# Patient Record
Sex: Female | Born: 1935 | Race: White | Hispanic: No | Smoking: Never smoker
Health system: Southern US, Community
[De-identification: ages and names within clinical notes are randomized; demographics above are authoritative.]

## PROBLEM LIST (undated history)

## (undated) DIAGNOSIS — L821 Other seborrheic keratosis: Secondary | ICD-10-CM

## (undated) DIAGNOSIS — I5032 Chronic diastolic (congestive) heart failure: Secondary | ICD-10-CM

## (undated) DIAGNOSIS — N183 Chronic kidney disease, stage 3 unspecified: Secondary | ICD-10-CM

## (undated) DIAGNOSIS — J849 Interstitial pulmonary disease, unspecified: Secondary | ICD-10-CM

## (undated) DIAGNOSIS — I10 Essential (primary) hypertension: Secondary | ICD-10-CM

## (undated) DIAGNOSIS — K5790 Diverticulosis of intestine, part unspecified, without perforation or abscess without bleeding: Secondary | ICD-10-CM

## (undated) DIAGNOSIS — M8580 Other specified disorders of bone density and structure, unspecified site: Secondary | ICD-10-CM

## (undated) DIAGNOSIS — I4819 Other persistent atrial fibrillation: Secondary | ICD-10-CM

## (undated) HISTORY — DX: Chronic kidney disease, stage 3 (moderate): N18.3

## (undated) HISTORY — DX: Other specified disorders of bone density and structure, unspecified site: M85.80

## (undated) HISTORY — DX: Other persistent atrial fibrillation: I48.19

## (undated) HISTORY — DX: Chronic diastolic (congestive) heart failure: I50.32

## (undated) HISTORY — PX: UMBILICAL HERNIA REPAIR: SHX196

## (undated) HISTORY — DX: Essential (primary) hypertension: I10

## (undated) HISTORY — DX: Diverticulosis of intestine, part unspecified, without perforation or abscess without bleeding: K57.90

## (undated) HISTORY — DX: Interstitial pulmonary disease, unspecified: J84.9

## (undated) HISTORY — DX: Chronic kidney disease, stage 3 unspecified: N18.30

---

## 2004-02-18 ENCOUNTER — Other Ambulatory Visit: Admission: RE | Admit: 2004-02-18 | Discharge: 2004-02-18 | Payer: Self-pay | Admitting: Family Medicine

## 2004-04-25 ENCOUNTER — Ambulatory Visit: Payer: Self-pay | Admitting: Family Medicine

## 2004-06-13 ENCOUNTER — Ambulatory Visit: Payer: Self-pay | Admitting: Family Medicine

## 2004-06-16 ENCOUNTER — Encounter: Admission: RE | Admit: 2004-06-16 | Discharge: 2004-06-16 | Payer: Self-pay | Admitting: Family Medicine

## 2005-05-11 ENCOUNTER — Ambulatory Visit: Payer: Self-pay | Admitting: Family Medicine

## 2005-05-25 ENCOUNTER — Ambulatory Visit: Payer: Self-pay | Admitting: Family Medicine

## 2005-06-02 ENCOUNTER — Ambulatory Visit: Payer: Self-pay | Admitting: Internal Medicine

## 2005-06-05 ENCOUNTER — Encounter: Admission: RE | Admit: 2005-06-05 | Discharge: 2005-06-05 | Payer: Self-pay | Admitting: Family Medicine

## 2005-09-16 ENCOUNTER — Emergency Department (HOSPITAL_COMMUNITY): Admission: EM | Admit: 2005-09-16 | Discharge: 2005-09-16 | Payer: Self-pay | Admitting: Emergency Medicine

## 2006-08-14 ENCOUNTER — Ambulatory Visit: Payer: Self-pay | Admitting: Family Medicine

## 2006-08-14 LAB — CONVERTED CEMR LAB
Albumin: 4 g/dL (ref 3.5–5.2)
Basophils Absolute: 0.1 10*3/uL (ref 0.0–0.1)
Cholesterol: 152 mg/dL (ref 0–200)
Eosinophils Absolute: 0.2 10*3/uL (ref 0.0–0.6)
GFR calc Af Amer: 62 mL/min
GFR calc non Af Amer: 51 mL/min
HCT: 43 % (ref 36.0–46.0)
HDL: 46.9 mg/dL (ref >39.0)
Hemoglobin: 15 g/dL (ref 12.0–15.0)
MCHC: 34.8 g/dL (ref 30.0–36.0)
MCV: 95.9 fL (ref 78.0–100.0)
Monocytes Absolute: 0.5 10*3/uL (ref 0.2–0.7)
Neutrophils Relative %: 61.9 % (ref 43.0–77.0)
Potassium: 4.2 meq/L (ref 3.5–5.1)
Sodium: 138 meq/L (ref 135–145)
TSH: 1.49 microintl units/mL (ref 0.35–5.50)
Total Bilirubin: 1.3 mg/dL — ABNORMAL HIGH (ref 0.3–1.2)
Total CHOL/HDL Ratio: 3.2

## 2006-09-20 ENCOUNTER — Ambulatory Visit: Payer: Self-pay | Admitting: Family Medicine

## 2006-09-27 ENCOUNTER — Encounter: Admission: RE | Admit: 2006-09-27 | Discharge: 2006-09-27 | Payer: Self-pay | Admitting: Surgery

## 2006-10-01 ENCOUNTER — Ambulatory Visit (HOSPITAL_BASED_OUTPATIENT_CLINIC_OR_DEPARTMENT_OTHER): Admission: RE | Admit: 2006-10-01 | Discharge: 2006-10-01 | Payer: Self-pay | Admitting: Surgery

## 2007-01-07 IMAGING — CR DG LUMBAR SPINE COMPLETE 4+V
5 series · 5 of 5 positions shown · non-contrast
Comparison: none

CLINICAL DATA: No known injuries.  Left-sided lower back pain.  Spasms to groin.  Constipation.
 DIAGNOSTIC LUMBAR SPINE COMPLETE:
 Five non-rib bearing lumbar vertebrae are seen with slight dextrorotary scoliosis lumbar spine.  Slight degenerative disc space narrowing is seen at L1-2 and L3-4.  Remaining lumbar disc spaces and posterior vertebral alignment are maintained.  Slight bilateral L5-S1 facet degenerative joint disease is noted with primarily right lateral degenerative osteophytes at L4-5.  Slight vascular calcification and calcified midline pelvic uterine fibroid measures 3.5 cm long x 3.2 cm wide.

[view not recorded (1 of 5)]
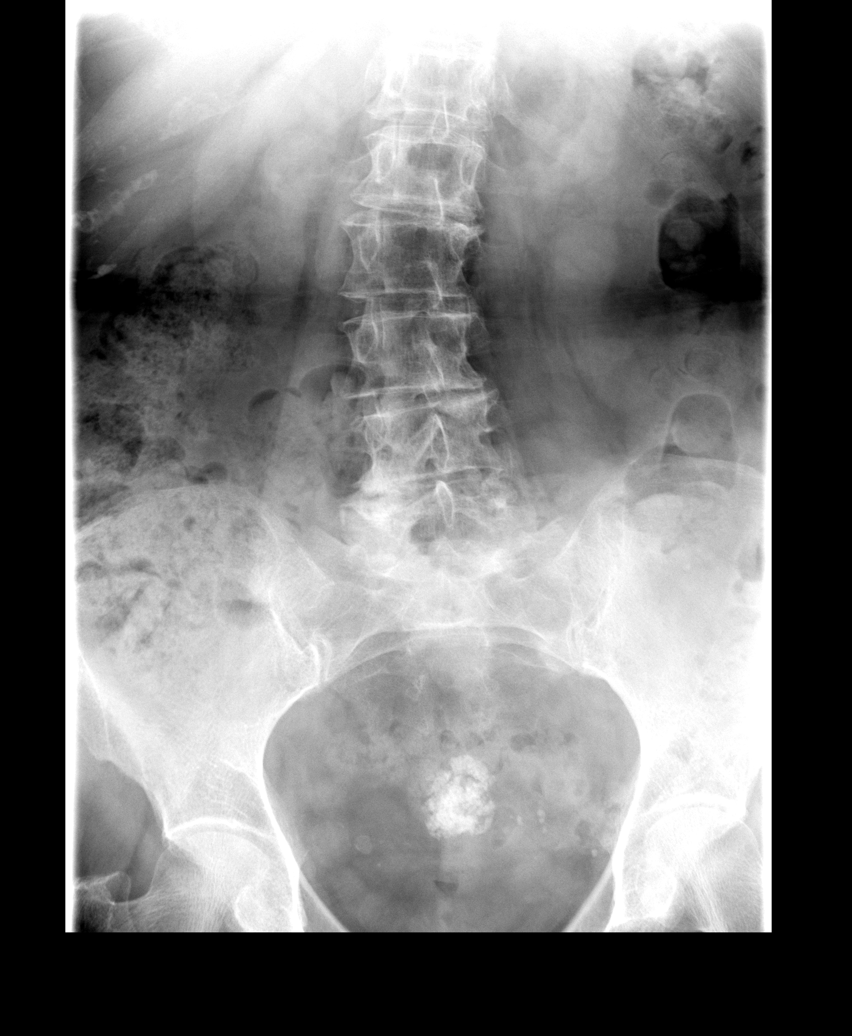

[view not recorded (2 of 5)]
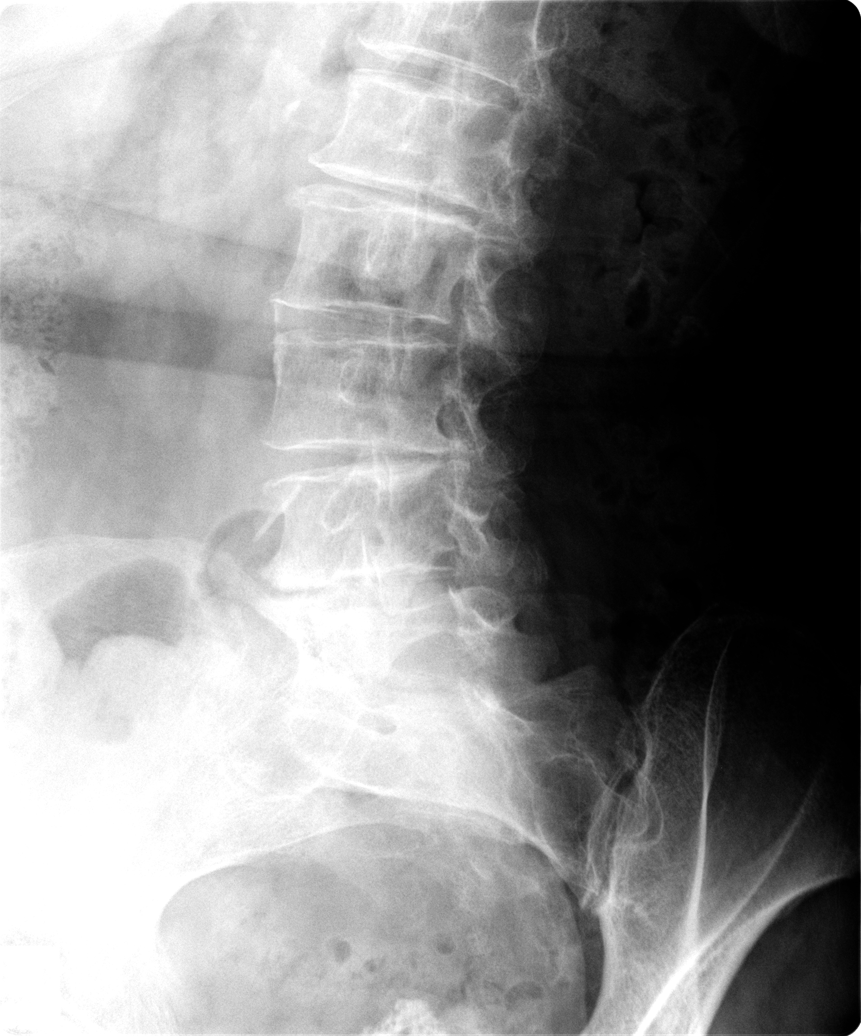

[view not recorded (3 of 5)]
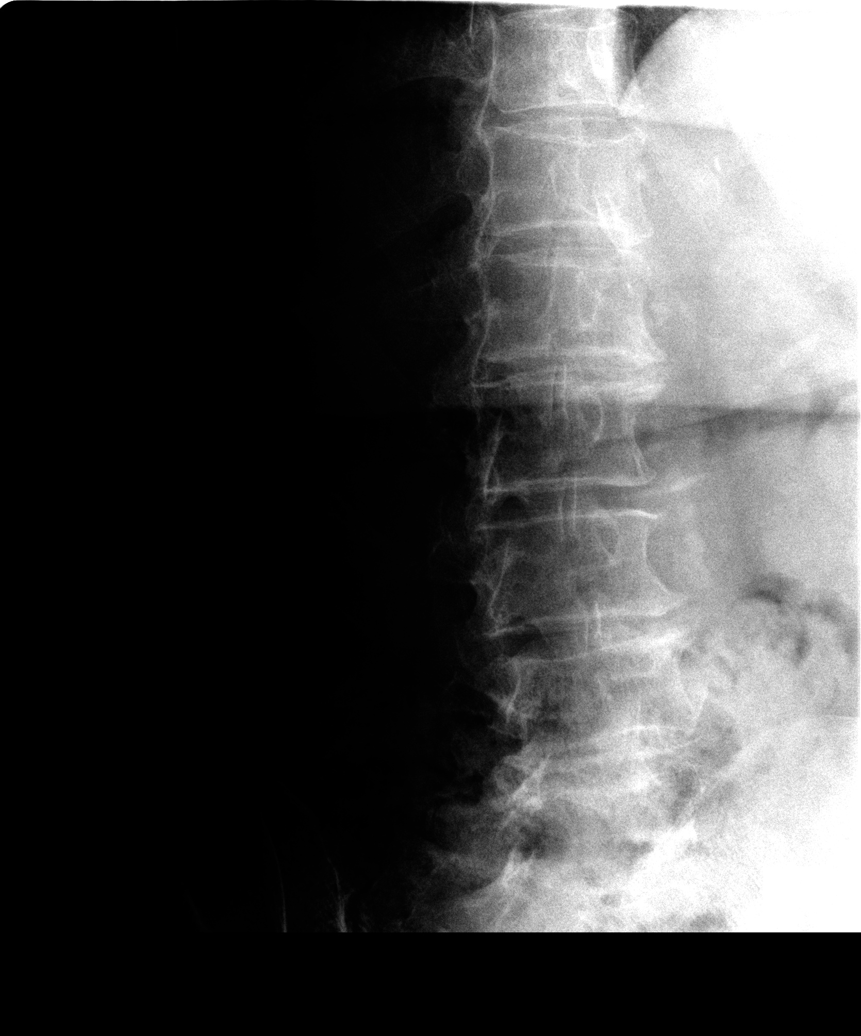

[view not recorded (4 of 5)]
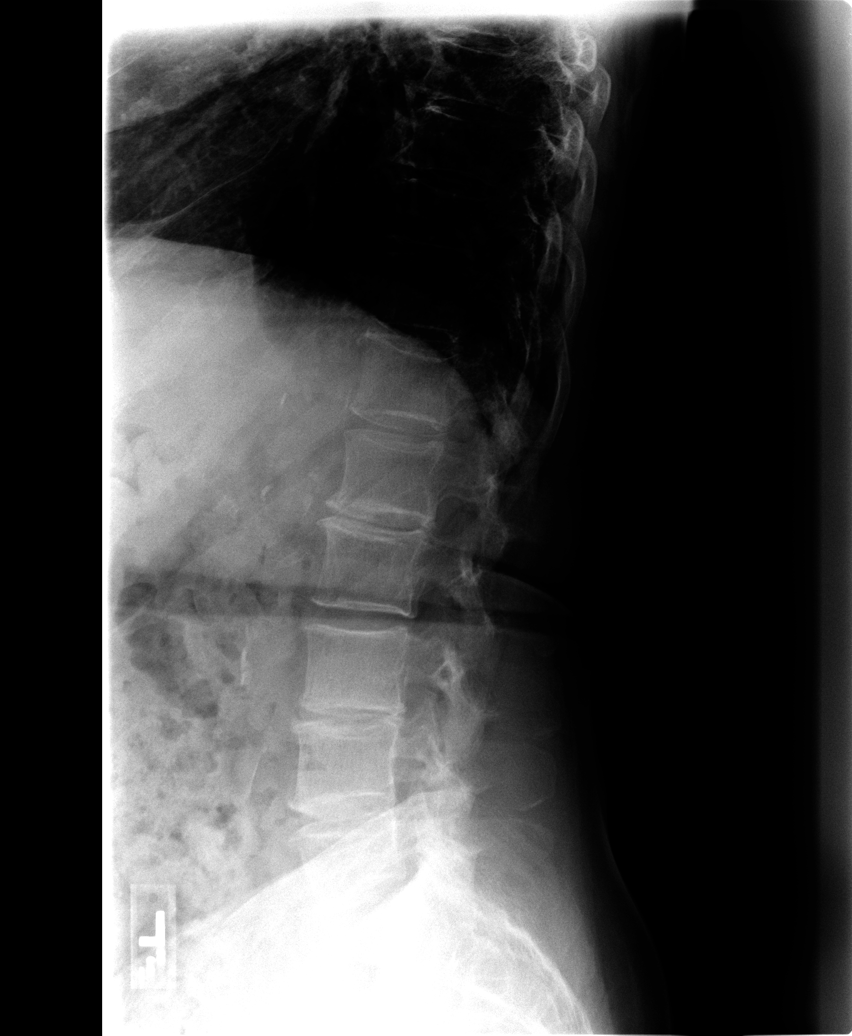

[view not recorded (5 of 5)]
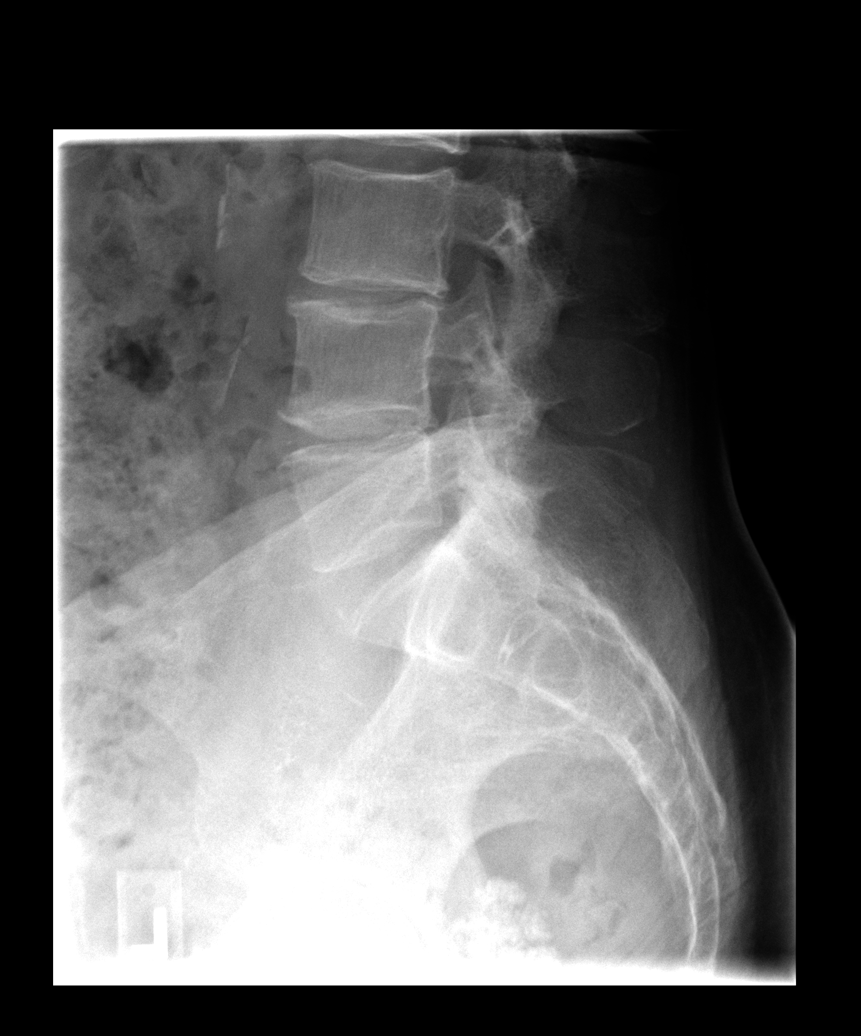

[5 of 5 positions shown; findings below may reference images not displayed]

IMPRESSION: 1.  Slight dextrorotary scoliosis with degenerative disc disease L1-2 and L3-4 and right greater than left slight L5-S1 facet degenerative joint disease.
 2.   Slight primarily anterolateral degenerative vertebral osteophytes most marked at the right L4-5 level.
 3.  3.4 cm calcified uterine fibroid.

## 2007-04-05 ENCOUNTER — Encounter: Payer: Self-pay | Admitting: Family Medicine

## 2007-04-22 ENCOUNTER — Encounter (INDEPENDENT_AMBULATORY_CARE_PROVIDER_SITE_OTHER): Payer: Self-pay | Admitting: *Deleted

## 2007-12-26 ENCOUNTER — Ambulatory Visit: Payer: Self-pay | Admitting: Family Medicine

## 2007-12-26 DIAGNOSIS — M949 Disorder of cartilage, unspecified: Secondary | ICD-10-CM

## 2007-12-26 DIAGNOSIS — I10 Essential (primary) hypertension: Secondary | ICD-10-CM | POA: Insufficient documentation

## 2007-12-26 DIAGNOSIS — M899 Disorder of bone, unspecified: Secondary | ICD-10-CM | POA: Insufficient documentation

## 2007-12-29 ENCOUNTER — Telehealth (INDEPENDENT_AMBULATORY_CARE_PROVIDER_SITE_OTHER): Payer: Self-pay | Admitting: *Deleted

## 2008-01-02 ENCOUNTER — Telehealth (INDEPENDENT_AMBULATORY_CARE_PROVIDER_SITE_OTHER): Payer: Self-pay | Admitting: *Deleted

## 2008-01-03 ENCOUNTER — Encounter (INDEPENDENT_AMBULATORY_CARE_PROVIDER_SITE_OTHER): Payer: Self-pay | Admitting: *Deleted

## 2008-01-03 ENCOUNTER — Ambulatory Visit: Payer: Self-pay | Admitting: Family Medicine

## 2008-01-03 LAB — CONVERTED CEMR LAB
OCCULT 1: NEGATIVE
OCCULT 2: NEGATIVE
OCCULT 3: NEGATIVE

## 2008-05-01 ENCOUNTER — Encounter: Payer: Self-pay | Admitting: Family Medicine

## 2008-07-06 ENCOUNTER — Ambulatory Visit: Payer: Self-pay | Admitting: Family Medicine

## 2008-07-06 LAB — CONVERTED CEMR LAB
Calcium: 9.6 mg/dL (ref 8.4–10.5)
Creatinine, Ser: 1.2 mg/dL (ref 0.4–1.2)
GFR calc Af Amer: 56 mL/min
Sodium: 139 meq/L (ref 135–145)

## 2008-07-08 ENCOUNTER — Encounter (INDEPENDENT_AMBULATORY_CARE_PROVIDER_SITE_OTHER): Payer: Self-pay | Admitting: *Deleted

## 2008-11-24 ENCOUNTER — Telehealth (INDEPENDENT_AMBULATORY_CARE_PROVIDER_SITE_OTHER): Payer: Self-pay | Admitting: *Deleted

## 2009-01-06 ENCOUNTER — Ambulatory Visit: Payer: Self-pay | Admitting: Family Medicine

## 2009-01-11 ENCOUNTER — Encounter (INDEPENDENT_AMBULATORY_CARE_PROVIDER_SITE_OTHER): Payer: Self-pay | Admitting: *Deleted

## 2009-01-15 ENCOUNTER — Ambulatory Visit: Payer: Self-pay | Admitting: Family Medicine

## 2009-01-15 ENCOUNTER — Encounter (INDEPENDENT_AMBULATORY_CARE_PROVIDER_SITE_OTHER): Payer: Self-pay | Admitting: *Deleted

## 2009-01-15 LAB — CONVERTED CEMR LAB
OCCULT 1: NEGATIVE
OCCULT 2: NEGATIVE

## 2009-01-18 ENCOUNTER — Encounter: Payer: Self-pay | Admitting: Family Medicine

## 2009-01-18 ENCOUNTER — Ambulatory Visit: Payer: Self-pay | Admitting: Internal Medicine

## 2009-02-09 ENCOUNTER — Telehealth: Payer: Self-pay | Admitting: Family Medicine

## 2009-06-10 ENCOUNTER — Encounter: Payer: Self-pay | Admitting: Family Medicine

## 2009-06-21 ENCOUNTER — Encounter: Payer: Self-pay | Admitting: Family Medicine

## 2010-03-23 ENCOUNTER — Encounter: Payer: Self-pay | Admitting: Family Medicine

## 2010-03-23 ENCOUNTER — Ambulatory Visit: Payer: Self-pay | Admitting: Family Medicine

## 2010-03-23 LAB — CONVERTED CEMR LAB
Bilirubin Urine: NEGATIVE
Specific Gravity, Urine: 1.025
pH: 5

## 2010-03-24 LAB — CONVERTED CEMR LAB
Albumin: 4.1 g/dL (ref 3.5–5.2)
Alkaline Phosphatase: 64 units/L (ref 39–117)
Basophils Absolute: 0.1 10*3/uL (ref 0.0–0.1)
CO2: 26 meq/L (ref 19–32)
Calcium: 9.6 mg/dL (ref 8.4–10.5)
Cholesterol: 154 mg/dL (ref 0–200)
Creatinine, Ser: 1.1 mg/dL (ref 0.4–1.2)
Eosinophils Absolute: 0.6 10*3/uL (ref 0.0–0.7)
Glucose, Bld: 81 mg/dL (ref 70–99)
HDL: 52.7 mg/dL (ref >39.00)
Hemoglobin: 15.1 g/dL — ABNORMAL HIGH (ref 12.0–15.0)
Lymphocytes Relative: 21.1 % (ref 12.0–46.0)
MCHC: 34.9 g/dL (ref 30.0–36.0)
Neutro Abs: 4.4 10*3/uL (ref 1.4–7.7)
Neutrophils Relative %: 61.9 % (ref 43.0–77.0)
RDW: 13.4 % (ref 11.5–14.6)
Triglycerides: 86 mg/dL (ref 0.0–149.0)

## 2010-04-05 ENCOUNTER — Ambulatory Visit: Payer: Self-pay | Admitting: Family Medicine

## 2010-06-22 ENCOUNTER — Encounter: Payer: Self-pay | Admitting: Family Medicine

## 2010-06-23 ENCOUNTER — Encounter (INDEPENDENT_AMBULATORY_CARE_PROVIDER_SITE_OTHER): Payer: Self-pay | Admitting: *Deleted

## 2010-06-26 LAB — CONVERTED CEMR LAB
ALT: 18 units/L (ref 0–35)
Albumin: 4.2 g/dL (ref 3.5–5.2)
BUN: 19 mg/dL (ref 6–23)
Basophils Absolute: 0.1 10*3/uL (ref 0.0–0.1)
Basophils Relative: 0.1 % (ref 0.0–3.0)
Bilirubin, Direct: 0.1 mg/dL (ref 0.0–0.3)
Bilirubin, Direct: 0.1 mg/dL (ref 0.0–0.3)
Blood in Urine, dipstick: NEGATIVE
CO2: 28 meq/L (ref 19–32)
CO2: 29 meq/L (ref 19–32)
Calcium: 9.6 mg/dL (ref 8.4–10.5)
Chloride: 105 meq/L (ref 96–112)
Cholesterol: 150 mg/dL (ref 0–200)
Cholesterol: 151 mg/dL (ref 0–200)
Creatinine, Ser: 1.1 mg/dL (ref 0.4–1.2)
Eosinophils Absolute: 0.3 10*3/uL (ref 0.0–0.7)
Eosinophils Absolute: 0.4 10*3/uL (ref 0.0–0.7)
GFR calc Af Amer: 62 mL/min
GFR calc non Af Amer: 51 mL/min
Glucose, Urine, Semiquant: NEGATIVE
HCT: 42.9 % (ref 36.0–46.0)
Hemoglobin: 15.4 g/dL — ABNORMAL HIGH (ref 12.0–15.0)
Ketones, urine, test strip: NEGATIVE
LDL Cholesterol: 88 mg/dL (ref 0–99)
Lymphocytes Relative: 25.4 % (ref 12.0–46.0)
Lymphs Abs: 2 10*3/uL (ref 0.7–4.0)
MCHC: 34.3 g/dL (ref 30.0–36.0)
MCHC: 36 g/dL (ref 30.0–36.0)
MCV: 98.8 fL (ref 78.0–100.0)
Monocytes Absolute: 0.5 10*3/uL (ref 0.1–1.0)
Monocytes Absolute: 0.5 10*3/uL (ref 0.1–1.0)
Neutro Abs: 4.2 10*3/uL (ref 1.4–7.7)
Neutrophils Relative %: 58.7 % (ref 43.0–77.0)
Potassium: 4.4 meq/L (ref 3.5–5.1)
RBC: 4.34 M/uL (ref 3.87–5.11)
RDW: 12.1 % (ref 11.5–14.6)
Sodium: 140 meq/L (ref 135–145)
Total Bilirubin: 1.4 mg/dL — ABNORMAL HIGH (ref 0.3–1.2)
Total Protein: 7.7 g/dL (ref 6.0–8.3)
Triglycerides: 102 mg/dL (ref 0.0–149.0)
Triglycerides: 80 mg/dL (ref 0–149)
Vit D, 1,25-Dihydroxy: 78 (ref 30–89)
Vit D, 25-Hydroxy: 24 ng/mL — ABNORMAL LOW (ref 30–89)
WBC Urine, dipstick: NEGATIVE

## 2010-06-30 NOTE — Assessment & Plan Note (Signed)
Summary: YEARLY EXAM AND FASTING///SPH   Vital Signs:  Patient profile:   77 year old female Height:      60 inches Weight:      144.4 pounds BMI:     28.30 Temp:     98.1 degrees F oral Pulse rate:   72 / minute Pulse rhythm:   regular BP sitting:   124 / 78  (right arm) Cuff size:   regular  Vitals Entered By: [name] CMA Duncan Dull) (March 23, 2010 9:04 AM) CC: CPX/Fasting, wants to discuss flu vaccine  Does patient need assistance? Functional Status Self care, Cook/clean, Shopping, Social activities Ambulation Normal Comments pt is able to do all adls and can read and write.  Vision Screening:      Vision Comments: optho q1y 40db HL: Left  Right  Audiometry Comment: grossly normal    History of Present Illness: Pt here for cpe and labs.  No complaints.   Preventive Screening-Counseling & Management  Alcohol-Tobacco     Alcohol drinks/day: <1     Smoking Status: never  Caffeine-Diet-Exercise     Caffeine use/day: 0     Does Patient Exercise: yes     Type of exercise: walking     Exercise (avg: min/session): 30-60     Times/week: 4  Hep-HIV-STD-Contraception     HIV Risk: no     Dental Visit-last 6 months yes     Dental Care Counseling: not indicated; dental care within six months     SBE monthly: yes     SBE Education/Counseling: not indicated; SBE done regularly     Sun Exposure-Excessive: no  Safety-Violence-Falls     Seat Belt Use: yes     Firearms in the Home: no firearms in the home     Firearm Counseling: not applicable     Smoke Detectors: yes     Smoke Detector Counseling: no     Violence in the Home: no risk noted     Violence Counseling: not indicated; no violence risk noted     Sexual Abuse: no     Sexual Abuse Counseling: no     Fall Risk: no      Sexual History:  widowed.        Drug Use:  never.        Blood Transfusions:  no.    Current Medications (verified): 1)  Hydrochlorothiazide 25 Mg  Tabs (Hydrochlorothiazide)  .... Take One Tablet Daily 2)  Calcium 600/vitamin D 600-400 Mg-Unit  Chew (Calcium Carbonate-Vitamin D) .Marland Kitchen.. 1 By Mouth Two Times A Day 3)  Zostavax 04540 Unt/0.50ml Solr (Zoster Vaccine Live) .Marland Kitchen.. 1 Ml  Im X1 4)  Aspir-Low 81 Mg Tbec (Aspirin) .... By Mouth Once Daily 5)  Centrum Silver Ultra Womens  Tabs (Multiple Vitamins-Minerals) .Marland Kitchen.. 1 By Mouth Once Daily  Allergies (verified): No Known Drug Allergies  Past History:  Past Medical History: Last updated: 12/26/2007 Hypertension Osteopenia  Past Surgical History: Last updated: 12/26/2007 Childbirth x3 1992-colonoscopy-[facility] 10/05-BMD May 08-Hernia Repair 09/20/06 bmd  Family History: Last updated: 12/26/2007 Family History of Arthritis F--ChF M-CHF, Mac degeneration  Social History: Last updated: 12/26/2007 Widow/Widower Never Smoked Alcohol use-no Drug use-no Regular exercise-yes--1 mile 3x a week -- Retired-- transportation business,  Art therapist  Risk Factors: Alcohol Use: <1 (03/23/2010) Caffeine Use: 0 (03/23/2010) Exercise: yes (03/23/2010)  Risk Factors: Smoking Status: never (03/23/2010)  Family History: Reviewed history from 12/26/2007 and no changes required. Family History of Arthritis F--ChF M-CHF, Mac degeneration  Social History: Reviewed history from 12/26/2007 and no changes required. Widow/Widower Never Smoked Alcohol use-no Drug use-no Regular exercise-yes--1 mile 3x a week -- Retired-- transportation business,  Art therapist Fall Risk:  no  Review of Systems      See HPI General:  Denies chills, fatigue, fever, loss of appetite, malaise, sleep disorder, sweats, weakness, and weight loss. Eyes:  Denies blurring, discharge, double vision, eye irritation, eye pain, halos, itching, light sensitivity, red eye, vision loss-1 eye, and vision loss-both eyes. ENT:  Denies decreased hearing, difficulty swallowing, ear discharge, earache, hoarseness, nasal congestion, nosebleeds,  postnasal drainage, ringing in ears, sinus pressure, and sore throat. CV:  Denies bluish discoloration of lips or nails, chest pain or discomfort, difficulty breathing at night, difficulty breathing while lying down, fainting, fatigue, leg cramps with exertion, lightheadness, near fainting, palpitations, shortness of breath with exertion, swelling of feet, swelling of hands, and weight gain. Resp:  Denies chest discomfort, chest pain with inspiration, cough, coughing up blood, excessive snoring, hypersomnolence, morning headaches, pleuritic, shortness of breath, sputum productive, and wheezing. GI:  Denies abdominal pain, bloody stools, change in bowel habits, constipation, dark tarry stools, diarrhea, excessive appetite, gas, hemorrhoids, indigestion, loss of appetite, and nausea. GU:  Denies abnormal vaginal bleeding, decreased libido, discharge, dysuria, genital sores, hematuria, incontinence, nocturia, urinary frequency, and urinary hesitancy. MS:  Denies joint pain, joint redness, joint swelling, loss of strength, low back pain, mid back pain, muscle aches, muscle , cramps, muscle weakness, stiffness, and thoracic pain. Derm:  Denies changes in color of skin, changes in nail beds, dryness, excessive perspiration, flushing, hair loss, insect bite(s), itching, lesion(s), poor wound healing, and rash. Neuro:  Denies brief paralysis, difficulty with concentration, disturbances in coordination, falling down, headaches, inability to speak, memory loss, numbness, poor balance, seizures, sensation of room spinning, tingling, tremors, visual disturbances, and weakness. Psych:  Denies alternate hallucination ( auditory/visual), anxiety, depression, easily angered, easily tearful, irritability, mental problems, panic attacks, sense of great danger, suicidal thoughts/plans, thoughts of violence, unusual visions or sounds, and thoughts /plans of harming others. Endo:  Denies cold intolerance, excessive hunger,  excessive thirst, excessive urination, heat intolerance, polyuria, and weight change. Heme:  Denies abnormal bruising, bleeding, enlarge lymph nodes, fevers, pallor, and skin discoloration. Allergy:  Denies hives or rash, itching eyes, persistent infections, seasonal allergies, and sneezing.  Physical Exam  General:  Well-developed,well-nourished,in no acute distress; alert,appropriate and cooperative throughout examination Head:  Normocephalic and atraumatic without obvious abnormalities. No apparent alopecia or balding. Eyes:  vision grossly intact, pupils equal, pupils round, pupils reactive to light, and no injection.   Ears:  External ear exam shows no significant lesions or deformities.  Otoscopic examination reveals clear canals, tympanic membranes are intact bilaterally without bulging, retraction, inflammation or discharge. Hearing is grossly normal bilaterally. Nose:  External nasal examination shows no deformity or inflammation. Nasal mucosa are pink and moist without lesions or exudates. Mouth:  Oral mucosa and oropharynx without lesions or exudates.  Teeth in good repair. Neck:  No deformities, masses, or tenderness noted.no cervical lymphadenopathy.   Chest Wall:  No deformities, masses, or tenderness noted. Breasts:  No mass, nodules, thickening, tenderness, bulging, retraction, inflamation, nipple discharge or skin changes noted.   Lungs:  Normal respiratory effort, chest expands symmetrically. Lungs are clear to auscultation, no crackles or wheezes. Heart:  normal rate and no murmur.   Abdomen:  Bowel sounds positive,abdomen soft and non-tender without masses, organomegaly or hernias noted. Genitalia:  refused Msk:  normal  ROM, no joint tenderness, no joint swelling, no joint warmth, no redness over joints, no joint deformities, no joint instability, and no crepitation.   Pulses:  R and L carotid,radial,femoral,dorsalis pedis and posterior tibial pulses are full and equal  bilaterally Extremities:  No clubbing, cyanosis, edema, or deformity noted with normal full range of motion of all joints.   Neurologic:  No cranial nerve deficits noted. Station and gait are normal. Plantar reflexes are down-going bilaterally. DTRs are symmetrical throughout. Sensory, motor and coordinative functions appear intact. Skin:  Intact without suspicious lesions or rashes Cervical Nodes:  No lymphadenopathy noted Axillary Nodes:  No palpable lymphadenopathy Psych:  Cognition and judgment appear intact. Alert and cooperative with normal attention span and concentration. No apparent delusions, illusions, hallucinations   Impression & Recommendations:  Problem # 1:  PREVENTIVE HEALTH CARE (ICD-V70.0)  Orders: Venipuncture (78295) TLB-Lipid Panel (80061-LIPID) TLB-BMP (Basic Metabolic Panel-BMET) (80048-METABOL) TLB-CBC Platelet - w/Differential (85025-CBCD) TLB-Hepatic/Liver Function Pnl (80076-HEPATIC) T-Vitamin D (25-Hydroxy) (62130-86578) Medicare -1st Annual Wellness Visit [phone]) EKG w/ Interpretation (93000) UA Dipstick W/ Micro (manual) (95284)  Problem # 2:  HYPERTENSION (ICD-401.9)  Her updated medication list for this problem includes:    Hydrochlorothiazide 25 Mg Tabs (Hydrochlorothiazide) .Marland Kitchen... Take one tablet daily  Orders: Venipuncture (13244) TLB-Lipid Panel (80061-LIPID) TLB-BMP (Basic Metabolic Panel-BMET) (80048-METABOL) TLB-CBC Platelet - w/Differential (85025-CBCD) TLB-Hepatic/Liver Function Pnl (80076-HEPATIC) T-Vitamin D (25-Hydroxy) (01027-25366) Specimen Handling (44034)  BP today: 124/78 Prior BP: 150/82 (01/06/2009)  Labs Reviewed: K+: 4.4 (01/06/2009) Creat: : 1.1 (01/06/2009)   Chol: 150 (01/06/2009)   HDL: 49.80 (01/06/2009)   LDL: 80 (01/06/2009)   TG: 102.0 (01/06/2009)  Problem # 3:  OSTEOPENIA (ICD-733.90)  Her updated medication list for this problem includes:    Calcium 600/vitamin D 600-400 Mg-unit Chew (Calcium  carbonate-vitamin d) .Marland Kitchen... 1 by mouth two times a day  Orders: Venipuncture (74259) TLB-Lipid Panel (80061-LIPID) TLB-BMP (Basic Metabolic Panel-BMET) (80048-METABOL) TLB-CBC Platelet - w/Differential (85025-CBCD) TLB-Hepatic/Liver Function Pnl (80076-HEPATIC) T-Vitamin D (25-Hydroxy) (56387-56433) Specimen Handling (29518)  Bone Density: osteopenia (01/18/2009) Vit D:24 (01/06/2009), 78 (12/26/2007)  Complete Medication List: 1)  Hydrochlorothiazide 25 Mg Tabs (Hydrochlorothiazide) .... Take one tablet daily 2)  Calcium 600/vitamin D 600-400 Mg-unit Chew (Calcium carbonate-vitamin d) .Marland Kitchen.. 1 by mouth two times a day 3)  Zostavax 84166 Unt/0.57ml Solr (Zoster vaccine live) .Marland Kitchen.. 1 ml  im x1 4)  Aspir-low 81 Mg Tbec (Aspirin) .... By mouth once daily 5)  Centrum Silver Ultra Womens Tabs (Multiple vitamins-minerals) .Marland Kitchen.. 1 by mouth once daily  Other Orders: Flu Vaccine 46yrs + MEDICARE PATIENTS (A6301) Administration Flu vaccine - MCR (S0109) Prescriptions: HYDROCHLOROTHIAZIDE 25 MG  TABS (HYDROCHLOROTHIAZIDE) take one tablet daily  #100 x 3   Entered and Authorized by:   Loreen Freud DO   Signed by:   Loreen Freud DO on 03/23/2010   Method used:   Electronically to        [facility] [address] (retail)       [phone]       [place], [place]  [address]       Ph: [phone]       Fax: [phone]   RxID:   [id number]    Orders Added: 1)  Venipuncture [62703] 2)  TLB-Lipid Panel [80061-LIPID] 3)  TLB-BMP (Basic Metabolic Panel-BMET) [80048-METABOL] 4)  TLB-CBC Platelet - w/Differential [85025-CBCD] 5)  TLB-Hepatic/Liver Function Pnl [80076-HEPATIC] 6)  T-Vitamin D (25-Hydroxy) [50093-81829] 7)  Medicare -1st  Annual Wellness Visit [G0438] 8)  EKG w/ Interpretation [93000] 9)  Specimen Handling [99000] 10)  Flu Vaccine 60yrs + MEDICARE PATIENTS [Q2039] 11)  Administration Flu vaccine - MCR [G0008] 12)  UA Dipstick W/ Micro (manual)  [81000]    Herpes Zoster Next Due:  Refused Colonoscopy Result Date:  03/13/1990 Colonoscopy Result:  normal Colonoscopy Next Due:  10 yr Hemoccult Result Date:  03/03/2009 Hemoccult Result:  normal Hemoccult Next Due:  1 yr PAP Next Due:  Not Indicated Last Mammogram:  normal (05/01/2008 9:30:46 AM) Mammogram Result Date:  06/21/2009 Mammogram Result:  normal Mammogram Next Due:  1 yr Bone Density Result Date:  01/18/2009 Bone Density Result:  osteopenia Bone Density Next Due: 2 yr Flu Vaccine Consent Questions     Do you have a history of severe allergic reactions to this vaccine? no    Any prior history of allergic reactions to egg and/or gelatin? no    Do you have a sensitivity to the preservative Thimersol? no    Do you have a past history of Guillan-Barre Syndrome? no    Do you currently have an acute febrile illness? no    Have you ever had a severe reaction to latex? no    Vaccine information given and explained to patient? yes    Are you currently pregnant? no    Lot Number:[id number]   Exp Date:11/26/2010   Site Given  Left Deltoid IM Hemoccult Result Date:  03/03/2009 Hemoccult Result:  normal Hemoccult Next Due:  1 yr PAP Next Due:  Not Indicated Last Mammogram:  normal (05/01/2008 9:30:46 AM) Mammogram Result Date:  06/21/2009 Mammogram Result:  normal Mammogram Next Due:  1 yr Bone Density Result Date:  01/18/2009 Bone Density Result:  osteopenia Bone Density Next Due: 2 yr .lbmedflu1  Laboratory Results   Urine Tests   Date/Time Reported: March 23, 2010 10:43 AM   Routine Urinalysis   Color: yellow Appearance: Clear Glucose: negative   (Normal Range: Negative) Bilirubin: negative   (Normal Range: Negative) Ketone: negative   (Normal Range: Negative) Spec. Gravity: 1.025   (Normal Range: 1.003-1.035) Blood: negative   (Normal Range: Negative) pH: 5.0   (Normal Range: 5.0-8.0) Protein: negative   (Normal Range: Negative) Urobilinogen:  negative   (Normal Range: 0-1) Nitrite: negative   (Normal Range: Negative) Leukocyte Esterace: negative   (Normal Range: Negative)    Comments: Floydene Flock  March 23, 2010 10:44 AM

## 2010-06-30 NOTE — Miscellaneous (Signed)
   Clinical Lists Changes  Observations: Added new observation of MAMMOGRAM: normal (06/22/2010 14:41)      Preventive Care Screening  Mammogram:    Date:  06/22/2010    Results:  normal

## 2010-06-30 NOTE — Letter (Signed)
Summary: [facility]: Immunoassay Fecal Occult Blood (iFOB) Order Form  [facility] [place]  [address] [place], [place] [address]   Phone: [phone]  Fax: [phone]      [facility]: Immunoassay Fecal Occult Blood (iFOB) Order Form   March 23, 2010 MRN: [id number]   Allison Sellers 1926-10-28   Physicican Name: Dr.Lowne  Diagnosis Code: V56.71      [name] CMA (AAMA)

## 2010-10-14 NOTE — Op Note (Signed)
NAME:  Allison Sellers, Allison Sellers                 ACCOUNT NO.:  [id number]   MEDICAL RECORD NO.:  [id number]          PATIENT TYPE:  AMB   LOCATION:  DSC                          FACILITY:  [facility]   PHYSICIAN:  Currie Paris, M.D.DATE OF BIRTH:  1926-08-20   DATE OF PROCEDURE:  DATE OF DISCHARGE:                               OPERATIVE REPORT   __________ 0.5% plain Marcaine, I then made a curvilinear incision and  elevated the skin off of the hernia sac and actually entered the sac in  doing so.  There was omentum protruding through it.  The omentum was  reduced and the fascia cleaned off so that I could see it in all  directions.  The defect was no more than a centimeter in size.   I took a small piece of Marlex mesh which I cut into a cone shape and  placed into the defect and then closed the defect, incorporating the  mesh with closing sutures.  I used three sutures of 2-0 Prolene.  These  tied down easily and with no tension.  Only a very small amount of mesh  actually went towards the peritoneal cavity and I think would be covered  by peritoneum there.   Everything appeared to be dry.  I tacked the skin down with some 3-0  Vicryls to create the appearance of an inny.  The skin was then closed  with 4-0 Monocryl subcuticular plus Dermabond.   The patient tolerated the procedure well.  There no complications, and  all counts were correct.   Please note that the first half of this dictation did not come through  and has been dictated separately.      Currie Paris, M.D.  Electronically Signed     CJS/MEDQ  D:  10/01/2006  T:  10/01/2006  Job:  [id number]

## 2010-10-14 NOTE — Op Note (Signed)
Allison Sellers, Allison Sellers                 ACCOUNT NO.:  [id number]   MEDICAL RECORD NO.:  [id number]          PATIENT TYPE:  AMB   LOCATION:  DSC                          FACILITY:  [facility]   PHYSICIAN:  Currie Paris, M.D.DATE OF BIRTH:  04-Mar-1927   DATE OF PROCEDURE:  10/01/2006  DATE OF DISCHARGE:  10/01/2006                               OPERATIVE REPORT   ADDENDUM:  This is a repeat of the beginning of one that did not come  through.  The original dictation job ID number is [id number].   PREOPERATIVE DIAGNOSIS:  Umbilical hernia.   POSTOPERATIVE DIAGNOSIS:  Umbilical hernia.   OPERATION:  Repair of umbilical hernia.   SURGEON:  Currie Paris, M.D.   ANESTHESIA:  General.   CLINICAL HISTORY:  [name] is has presented with a small umbilical  hernia which has produced a few symptoms and she wished to have  repaired.   DESCRIPTION OF PROCEDURE:  The patient was seen in the holding area and  she had no further questions.  We confirmed umbilical hernia repair was  the planned procedure.   The patient was taken to the operating room and after satisfactory  general anesthesia had been obtained, the abdomen was prepped and  draped.  The time-out occurred.   The umbilical area was then infiltrated with 0.5% plain Marcaine.  The  remainder of this dictation is part of the original dictation, so I quit  here.      Currie Paris, M.D.  Electronically Signed     CJS/MEDQ  D:  10/03/2006  T:  10/03/2006  Job:  [id number]

## 2011-03-27 ENCOUNTER — Observation Stay (HOSPITAL_COMMUNITY)
Admission: EM | Admit: 2011-03-27 | Discharge: 2011-03-29 | Disposition: A | Discharge status: Home / Self Care | Payer: Medicare Other | Attending: Cardiology | Admitting: Cardiology

## 2011-03-27 ENCOUNTER — Emergency Department (HOSPITAL_COMMUNITY): Payer: Medicare Other

## 2011-03-27 ENCOUNTER — Encounter: Payer: Self-pay | Admitting: Family Medicine

## 2011-03-27 ENCOUNTER — Ambulatory Visit (INDEPENDENT_AMBULATORY_CARE_PROVIDER_SITE_OTHER): Payer: Medicare Other | Admitting: Family Medicine

## 2011-03-27 VITALS — BP 120/76 | HR 156 | Temp 98.5°F | Ht 60.0 in | Wt 148.8 lb

## 2011-03-27 DIAGNOSIS — Z23 Encounter for immunization: Secondary | ICD-10-CM

## 2011-03-27 DIAGNOSIS — R0789 Other chest pain: Secondary | ICD-10-CM | POA: Insufficient documentation

## 2011-03-27 DIAGNOSIS — I498 Other specified cardiac arrhythmias: Secondary | ICD-10-CM | POA: Insufficient documentation

## 2011-03-27 DIAGNOSIS — I4891 Unspecified atrial fibrillation: Secondary | ICD-10-CM | POA: Insufficient documentation

## 2011-03-27 DIAGNOSIS — R42 Dizziness and giddiness: Secondary | ICD-10-CM

## 2011-03-27 DIAGNOSIS — N390 Urinary tract infection, site not specified: Secondary | ICD-10-CM

## 2011-03-27 DIAGNOSIS — I471 Supraventricular tachycardia, unspecified: Principal | ICD-10-CM | POA: Insufficient documentation

## 2011-03-27 DIAGNOSIS — I10 Essential (primary) hypertension: Secondary | ICD-10-CM

## 2011-03-27 DIAGNOSIS — J8410 Pulmonary fibrosis, unspecified: Secondary | ICD-10-CM | POA: Insufficient documentation

## 2011-03-27 DIAGNOSIS — Z0000 Encounter for general adult medical examination without abnormal findings: Secondary | ICD-10-CM

## 2011-03-27 DIAGNOSIS — R2689 Other abnormalities of gait and mobility: Secondary | ICD-10-CM

## 2011-03-27 DIAGNOSIS — I491 Atrial premature depolarization: Secondary | ICD-10-CM | POA: Insufficient documentation

## 2011-03-27 DIAGNOSIS — R52 Pain, unspecified: Secondary | ICD-10-CM

## 2011-03-27 DIAGNOSIS — I519 Heart disease, unspecified: Secondary | ICD-10-CM | POA: Insufficient documentation

## 2011-03-27 LAB — BASIC METABOLIC PANEL
BUN: 26 mg/dL — ABNORMAL HIGH (ref 6–23)
BUN: 26 mg/dL — ABNORMAL HIGH (ref 6–23)
CO2: 27 mEq/L (ref 19–32)
Calcium: 10.2 mg/dL (ref 8.4–10.5)
Chloride: 101 mEq/L (ref 96–112)
Creatinine, Ser: 1.18 mg/dL — ABNORMAL HIGH (ref 0.50–1.10)
Glucose, Bld: 105 mg/dL — ABNORMAL HIGH (ref 70–99)
Potassium: 4.4 mEq/L (ref 3.5–5.1)
Sodium: 139 mEq/L (ref 135–145)

## 2011-03-27 LAB — HEPATIC FUNCTION PANEL
ALT: 16 U/L (ref 0–35)
AST: 17 U/L (ref 0–37)
Total Protein: 7.9 g/dL (ref 6.0–8.3)

## 2011-03-27 LAB — POCT URINALYSIS DIPSTICK
Bilirubin, UA: NEGATIVE
Glucose, UA: NEGATIVE
Nitrite, UA: NEGATIVE
Spec Grav, UA: 1.005

## 2011-03-27 LAB — URINE MICROSCOPIC-ADD ON

## 2011-03-27 LAB — POCT I-STAT TROPONIN I: Troponin i, poc: 0.01 ng/mL (ref 0.00–0.08)

## 2011-03-27 LAB — T3, FREE: T3, Free: 3.5 pg/mL (ref 2.3–4.2)

## 2011-03-27 LAB — CBC WITH DIFFERENTIAL/PLATELET
Basophils Relative: 0.5 % (ref 0.0–3.0)
Eosinophils Relative: 2.6 % (ref 0.0–5.0)
HCT: 48.1 % — ABNORMAL HIGH (ref 36.0–46.0)
Hemoglobin: 16.4 g/dL — ABNORMAL HIGH (ref 12.0–15.0)
Lymphs Abs: 2 10*3/uL (ref 0.7–4.0)
MCV: 97.6 fl (ref 78.0–100.0)
Monocytes Absolute: 0.5 10*3/uL (ref 0.1–1.0)
Neutro Abs: 4.6 10*3/uL (ref 1.4–7.7)
RBC: 4.93 Mil/uL (ref 3.87–5.11)
WBC: 7.3 10*3/uL (ref 4.5–10.5)

## 2011-03-27 LAB — URINALYSIS, ROUTINE W REFLEX MICROSCOPIC
Glucose, UA: NEGATIVE mg/dL
Hgb urine dipstick: NEGATIVE
Ketones, ur: NEGATIVE mg/dL
Protein, ur: NEGATIVE mg/dL

## 2011-03-27 LAB — PROTIME-INR
INR: 1 ratio (ref 0.8–1.0)
INR: 1.04 (ref 0.00–1.49)

## 2011-03-27 LAB — T4, FREE
Free T4: 1.27 ng/dL (ref 0.60–1.60)
Free T4: 1.24 ng/dL (ref 0.80–1.80)

## 2011-03-27 LAB — DIFFERENTIAL
Basophils Absolute: 0.1 10*3/uL (ref 0.0–0.1)
Basophils Relative: 1 % (ref 0–1)
Eosinophils Absolute: 0.1 10*3/uL (ref 0.0–0.7)
Eosinophils Relative: 2 % (ref 0–5)

## 2011-03-27 LAB — LIPID PANEL
Cholesterol: 141 mg/dL (ref 0–200)
Triglycerides: 93 mg/dL (ref 0.0–149.0)

## 2011-03-27 LAB — CBC
MCHC: 35 g/dL (ref 30.0–36.0)
Platelets: 295 10*3/uL (ref 150–400)
RDW: 13 % (ref 11.5–15.5)
WBC: 7.4 10*3/uL (ref 4.0–10.5)

## 2011-03-27 LAB — TROPONIN I
Troponin I: <0.3 ng/mL (ref <0.30)
Troponin I: <0.3 ng/mL (ref <0.30)

## 2011-03-27 LAB — CK TOTAL AND CKMB (NOT AT ARMC)
CK, MB: 1.9 ng/mL (ref 0.3–4.0)
Total CK: 18 U/L (ref 7–177)

## 2011-03-27 LAB — APTT: aPTT: 29.6 s — ABNORMAL HIGH (ref 21.7–28.8)

## 2011-03-27 LAB — TSH: TSH: 1.38 u[IU]/mL (ref 0.35–5.50)

## 2011-03-27 LAB — CARDIAC PANEL(CRET KIN+CKTOT+MB+TROPI): Total CK: 63 U/L (ref 7–177)

## 2011-03-27 NOTE — Progress Notes (Signed)
Addended by: Legrand Como on: 03/27/2011 10:14 AM   Modules accepted: Orders

## 2011-03-27 NOTE — Progress Notes (Signed)
  Subjective:    Patient ID: Allison Sellers, female    DOB: 11-07-1926, 77 y.o.   MRN: [id number]  HPI  Pt here for routine , cpe --no complaints.  When heart rate was found to be high  Ekg was done and pt was found to be in A fib.   Pt is currently asymptomatic but admits to feeling dizzy on occasion.  She also occasionally has chest presure at night.   Review of Systems as above   Objective:   Physical Exam  Constitutional: She is oriented to person, place, and time. She appears well-developed and well-nourished.  Neck: Normal range of motion. Neck supple.  Cardiovascular:       irreg irreg  Pulmonary/Chest: Effort normal and breath sounds normal.  Musculoskeletal: She exhibits no edema.  Neurological: She is alert and oriented to person, place, and time.  Psychiatric: She has a normal mood and affect. Her behavior is normal. Judgment and thought content normal.          Assessment & Plan:  New onset a fib--- pt heart rate 156 ---came down to 126 by the time she left .  Secondary to age and heart rate the cardiologist ([facility])  want [facility] sent [facility]  URI--- con't antihistamine,  Sample veramyst given to patient

## 2011-03-27 NOTE — Patient Instructions (Addendum)
Atrial Fibrillation Your caregiver has diagnosed you with atrial fibrillation (AFib). The heart normally beats very regularly; AFib is a type of irregular heartbeat. The heart rate may be faster or slower than normal. This can prevent your heart from pumping as well as it should. AFib can be constant (chronic) or intermittent (paroxysmal). CAUSES  Atrial fibrillation may be caused by:  Heart disease, including heart attack, coronary artery disease, heart failure, diseases of the heart valves, and others.   Blood clot in the lungs (pulmonary embolism).   Pneumonia or other infections.   Chronic lung disease.   Thyroid disease.   Toxins. These include alcohol, some medications (such as decongestant medications or diet pills), and caffeine.  In some people, no cause for AFib can be found. This is referred to as Lone Atrial Fibrillation. SYMPTOMS   Palpitations or a fluttering in your chest.   A vague sense of chest discomfort.   Shortness of breath.   Sudden onset of lightheadedness or weakness.  Sometimes, the first sign of AFib can be a complication of the condition. This could be a stroke or heart failure. DIAGNOSIS  Your description of your condition may make your caregiver suspicious of atrial fibrillation. Your caregiver will examine your pulse to determine if fibrillation is present. An EKG (electrocardiogram) will confirm the diagnosis. Further testing may help determine what caused you to have atrial fibrillation. This may include chest x-ray, echocardiogram, blood tests, or CT scans. PREVENTION  If you have previously had atrial fibrillation, your caregiver may advise you to avoid substances known to cause the condition (such as stimulant medications, and possibly caffeine or alcohol). You may be advised to use medications to prevent recurrence. Proper treatment of any underlying condition is important to help prevent recurrence. PROGNOSIS  Atrial fibrillation does tend to  become a chronic condition over time. It can cause significant complications (see below). Atrial fibrillation is not usually immediately life-threatening, but it can shorten your life expectancy. This seems to be worse in women. If you have lone atrial fibrillation and are under 60 years old, the risk of complications is very low, and life expectancy is not shortened. RISKS AND COMPLICATIONS  Complications of atrial fibrillation can include stroke, chest pain, and heart failure. Your caregiver will recommend treatments for the atrial fibrillation, as well as for any underlying conditions, to help minimize risk of complications. TREATMENT  Treatment for AFib is divided into several categories:  Treatment of any underlying condition.   Converting you out of AFib into a regular (sinus) rhythm.   Controlling rapid heart rate.   Prevention of blood clots and stroke.  Medications and procedures are available to convert your atrial fibrillation to sinus rhythm. However, recent studies have shown that this may not offer you any advantage, and cardiac experts are continuing research and debate on this topic. More important is controlling your rapid heartbeat. The rapid heartbeat causes more symptoms, and places strain on your heart. Your caregiver will advise you on the use of medications that can control your heart rate. Atrial fibrillation is a strong stroke risk. You can lessen this risk by taking blood thinning medications such as Coumadin (warfarin), or sometimes aspirin. These medications need close monitoring by your caregiver. Over-medication can cause bleeding. Too little medication may not protect against stroke. HOME CARE INSTRUCTIONS   If your caregiver prescribed medicine to make your heartbeat more normally, take as directed.   If blood thinners were prescribed by your caregiver, take EXACTLY as directed.     Perform blood tests EXACTLY as directed.   Quit smoking. Smoking increases your  cardiac and lung (pulmonary) risks.   DO NOT drink alcohol.   DO NOT drink caffeinated drinks (e.g. coffee, soda, chocolate, and leaf teas). You may drink decaffeinated coffee, soda or tea.   If you are overweight, you should choose a reduced calorie diet to lose weight. Please see a registered dietitian if you need more information about healthy weight loss. DO NOT USE DIET PILLS as they may aggravate heart problems.   If you have other heart problems that are causing AFib, you may need to eat a low salt, fat, and cholesterol diet. Your caregiver will tell you if this is necessary.   Exercise every day to improve your physical fitness. Stay active unless advised otherwise.   If your caregiver has given you a follow-up appointment, it is very important to keep that appointment. Not keeping the appointment could result in heart failure or stroke. If there is any problem keeping the appointment, you must call back to this facility for assistance.  SEEK MEDICAL CARE IF:  You notice a change in the rate, rhythm or strength of your heartbeat.   You develop an infection or any other change in your overall health status.  SEEK IMMEDIATE MEDICAL CARE IF:   You develop chest pain, abdominal pain, sweating, weakness or feel sick to your stomach (nausea).   You develop shortness of breath.   You develop swollen feet and ankles.   You develop dizziness, numbness, or weakness of your face or limbs, or any change in vision or speech.  MAKE SURE YOU:   Understand these instructions.   Will watch your condition.   Will get help right away if you are not doing well or get worse.  Document Released: 05/15/2005 Document Revised: 01/25/2011 Document Reviewed: 12/18/2007 [facility] Patient Information 2012 [place], [place].  Upper Respiratory Infection, Adult An upper respiratory infection (URI) is also known as the common cold. It is often caused by a type of germ (virus). Colds are easily spread  (contagious). You can pass it to others by kissing, coughing, sneezing, or drinking out of the same glass. Usually, you get better in 1 or 2 weeks.  HOME CARE   Only take medicine as told by your doctor.   Use a warm mist humidifier or breathe in steam from a hot shower.   Drink enough water and fluids to keep your pee (urine) clear or pale yellow.   Get plenty of rest.   Return to work when your temperature is back to normal or as told by your doctor. You may use a face mask and wash your hands to stop your cold from spreading.  GET HELP RIGHT AWAY IF:   After the first few days, you feel you are getting worse.   You have questions about your medicine.   You have chills, shortness of breath, or brown or red spit (mucus).   You have yellow or brown snot (nasal discharge) or pain in the face, especially when you bend forward.   You have a fever, puffy (swollen) neck, pain when you swallow, or white spots in the back of your throat.   You have a bad headache, ear pain, sinus pain, or chest pain.   You have a high-pitched whistling sound when you breathe in and out (wheezing).   You have a lasting cough or cough up blood.   You have sore muscles or a stiff neck.  MAKE  SURE YOU:   Understand these instructions.   Will watch your condition.   Will get help right away if you are not doing well or get worse.  Document Released: 11/01/2007 Document Revised: 01/25/2011 Document Reviewed: 09/19/2010 [facility] Patient Information 2012 [place], [place].

## 2011-03-28 ENCOUNTER — Observation Stay (HOSPITAL_COMMUNITY): Payer: Medicare Other

## 2011-03-28 LAB — HEMOGLOBIN A1C: Hgb A1c MFr Bld: 5.5 % (ref <5.7)

## 2011-03-28 LAB — LIPID PANEL
HDL: 46 mg/dL (ref >39)
LDL Cholesterol: 61 mg/dL (ref 0–99)
Total CHOL/HDL Ratio: 2.6 RATIO

## 2011-03-28 LAB — CARDIAC PANEL(CRET KIN+CKTOT+MB+TROPI)
CK, MB: 2.1 ng/mL (ref 0.3–4.0)
CK, MB: 1.8 ng/mL (ref 0.3–4.0)
Relative Index: INVALID (ref 0.0–2.5)
Total CK: 29 U/L (ref 7–177)
Total CK: 26 U/L (ref 7–177)

## 2011-03-28 LAB — BASIC METABOLIC PANEL
BUN: 24 mg/dL — ABNORMAL HIGH (ref 6–23)
Chloride: 98 mEq/L (ref 96–112)
Creatinine, Ser: 1.16 mg/dL — ABNORMAL HIGH (ref 0.50–1.10)
GFR calc Af Amer: 50 mL/min — ABNORMAL LOW (ref >90)
Glucose, Bld: 104 mg/dL — ABNORMAL HIGH (ref 70–99)

## 2011-03-29 HISTORY — PX: OTHER SURGICAL HISTORY: SHX169

## 2011-03-29 LAB — URINE CULTURE: Colony Count: 15000

## 2011-04-03 NOTE — Discharge Summary (Signed)
Allison Sellers, Allison Sellers NO.:  [id number]  MEDICAL RECORD NO.:  [id number]  LOCATION:  2027                         FACILITY:  [facility]  PHYSICIAN:  Landry Corporal, MD DATE OF BIRTH:  Nov 17, 1926  DATE OF ADMISSION:  03/27/2011 DATE OF DISCHARGE:  03/29/2011                              DISCHARGE SUMMARY   DISCHARGE DIAGNOSES: 1. Paroxysmal supraventricular tachycardia resolved.  Heart rate was     182.     a.     One short burst of paroxysmal supraventricular tachycardia,      March 29, 2011.     b.     One abnormal troponin I secondary to tachycardia. 2. Chest tightness at rest for outpatient nuclear stress test. 3. History of hypertension. 4. Crackles in lungs secondary to chronic interstitial lung disease     per CT scan. 5. Idioventricular rhythm.  Again, a short run of 8 beats. 6. Type 2 diastolic dysfunction by 2D echo with EF 55-60%.  DISCHARGE CONDITION:  Good.  DISCHARGE INSTRUCTIONS: 1. No restrictions on activity.  Increase activity slowly. 2. Heart healthy diet. 3. Follow up with Dr. Allyson Sabal on April 24, 2011, at 9:30 a.m. 4. Need an outpatient monitor, they will mail it to you. 5. You will need a stress test at our office on April 14, 2011, at     7:15 a.m., do not eat or drink after midnight the night before the     test.  Do not wear perfume or strong deodorant to the stress test,     and no caffeine x48 hours before the stress test.  DISCHARGE MEDICATIONS:  See medication reconciliation sheet.  Please note, her hydrochlorothiazide was discontinued.  We added metoprolol tartrate 25 mg b.i.d., aspirin 325 mg daily, Tylenol p.r.n. as well because she did have chest pain prior to admission.  She is going home with a prescription for nitroglycerin sublingual as needed.  HOSPITAL COURSE:  An 77 year old white female with a history of hypertension and previous hiatal hernia repair, was seen by her primary care physician on the day of  admission on March 27, 2011, and was found to be in possible paroxysmal SVT versus PAF.  Heart rate was 182. She was sent to [facility] for evaluation.  She also complained of some chest tightness in the evening.  The chest tightness would only come with rest, not with exertion.  She was admitted to rule out.  By the time she was admitted, she was back in sinus rhythm.  By the next morning, cardiac enzymes were essentially negative.  Her first troponin I was 0.41.  Her CKs have been negative and her troponin I has been negative, the next one was less than 0.30, and prior to this, 0.41.  She had a negative troponin on her screening troponin.  MBs have all been 1.7, 2.1, 1.8.  Because of dry crackles in her lung fields, Dr. Rennis Golden ordered a CT of her chest which reveals chronic interstitial lung disease with a questionable possibility of early idiopathic pulmonary fibrosis.  She had a 2D echo done on March 29, 2011, with some concentric remodeling and with diastolic dysfunction.  Additionally on March 29, 2011, she had more PSVT as well as idioventricular rhythm or accelerated ventricular rhythm, heart rate was 59 with this.  She was ambulating in the room without complications and ready for discharge home.  OTHER LABORATORY DATA:  Hemoglobin 16.4, hematocrit 48.1, WBC 7.3, and platelets 306.  ProTime 1.2, INR of 1.0, PTT 29.6.  Sodium 135, potassium 4.3, BUN 24, creatinine 1.16, glucose 104, GFR was 43.  Total cholesterol 121, triglycerides 72, HDL 46, LDL 61.  TSH 1.38.  UA did have some moderate leukocytes, but this was not a clean-catch urine.  The patient had no further complaints and was ready for discharge.  We will have her wear a watch monitor as an outpatient to detect any further arrhythmias and also a Lexiscan Myoview as an outpatient.  She was seen by Dr. Herbie Baltimore and these were explained to her.  At discharge, blood pressure was 119/61, pulse 85, respirations 20,  temperature 97.3, oxygen saturation on room air 98%.  Heart, regular rate and rhythm.  S1, S2.  Lungs, few crackles in her bases, otherwise clear.  Abdomen, positive bowel sounds.  Extremities without edema.     Darcella Gasman. Annie Paras, N.P.   ______________________________ Landry Corporal, MD    LRI/MEDQ  D:  03/29/2011  T:  03/29/2011  Job:  [id number]  cc:   Lelon Perla, DO Nanetta Batty, M.D.  Electronically Signed by Nada Boozer N.P. on 03/31/2011 09:27:04 AM Electronically Signed by Bryan Lemma MD on 04/03/2011 02:10:08 PM

## 2011-04-14 HISTORY — PX: OTHER SURGICAL HISTORY: SHX169

## 2011-04-17 ENCOUNTER — Other Ambulatory Visit: Payer: Self-pay | Admitting: Cardiology

## 2011-04-17 NOTE — H&P (Signed)
History and Physical Interval Note:  04/18/2011 5:42 PM  The patient has presented today for a planned out-patient procedure (Elective Synchronized DCCV), with the diagnosis of Atrial fibrillation with rapid ventricular rate. The various methods of treatment have been discussed with the patient and family.   The risks of DCCV include, but are not limited to: CVA, VT/VF arrest, MI -- with possibility of death adverse reaction to sedation, respiratory distress, skin burn, unsuccessful cardioversion.  After consideration of risks, benefits and other options for treatment, the patient has consented to Procedure(s):  SYNCHRONIZED DIRECT CURRENT CARDIOVERSION - under deep sedation as a surgical intervention.   The patients' history has been reviewed, patient examined, no change in status from most recent note (dated 04/17/2011).  She is stable for this procedure and has had all necessary labs checked and reviewed. I have reviewed the patients' chart and labs. Questions were answered to the patient's satisfaction.    HARDING,DAVID W 04/17/2011, 5:42 PM

## 2011-04-18 ENCOUNTER — Encounter (HOSPITAL_COMMUNITY): Payer: Self-pay | Admitting: Anesthesiology

## 2011-04-18 ENCOUNTER — Other Ambulatory Visit: Payer: Self-pay

## 2011-04-18 ENCOUNTER — Encounter (HOSPITAL_COMMUNITY)
Admission: RE | Disposition: A | Discharge status: Home / Self Care | Payer: Self-pay | Source: Ambulatory Visit | Attending: Cardiology

## 2011-04-18 ENCOUNTER — Ambulatory Visit (HOSPITAL_COMMUNITY)
Admission: RE | Admit: 2011-04-18 | Discharge: 2011-04-18 | Disposition: A | Discharge status: Home / Self Care | Payer: Medicare Other | Source: Ambulatory Visit | Attending: Cardiology | Admitting: Cardiology

## 2011-04-18 ENCOUNTER — Ambulatory Visit (HOSPITAL_COMMUNITY): Payer: Medicare Other | Admitting: Anesthesiology

## 2011-04-18 DIAGNOSIS — I4891 Unspecified atrial fibrillation: Secondary | ICD-10-CM

## 2011-04-18 DIAGNOSIS — I4819 Other persistent atrial fibrillation: Secondary | ICD-10-CM | POA: Diagnosis present

## 2011-04-18 DIAGNOSIS — Z01810 Encounter for preprocedural cardiovascular examination: Secondary | ICD-10-CM | POA: Insufficient documentation

## 2011-04-18 HISTORY — PX: CARDIOVERSION: SHX1299

## 2011-04-18 SURGERY — CARDIOVERSION
Anesthesia: Monitor Anesthesia Care | Wound class: Clean

## 2011-04-18 MED ORDER — SODIUM CHLORIDE 0.9 % IV SOLN
INTRAVENOUS | Status: DC | PRN
  Administered 2011-04-18: 14:00:00 via INTRAVENOUS

## 2011-04-18 MED ORDER — HYDROCORTISONE 1 % EX CREA: 1.0000 "application " | TOPICAL_CREAM | Freq: Three times a day (TID) | CUTANEOUS | Status: DC | PRN

## 2011-04-18 MED ORDER — SODIUM CHLORIDE 0.9 % IJ SOLN: 3.0000 mL | Freq: Two times a day (BID) | INTRAMUSCULAR | Status: DC

## 2011-04-18 MED ORDER — SODIUM CHLORIDE 0.9 % IJ SOLN: 3.0000 mL | INTRAMUSCULAR | Status: DC | PRN

## 2011-04-18 MED ORDER — SODIUM CHLORIDE 0.9 % IV SOLN: 250.0000 mL | INTRAVENOUS | Status: DC

## 2011-04-18 NOTE — Anesthesia Postprocedure Evaluation (Signed)
  Anesthesia Post-op Note  Patient: Allison Sellers  Procedure(s) Performed:  CARDIOVERSION  Patient Location: PACU and Short Stay  Anesthesia Type: General  Level of Consciousness: awake  Airway and Oxygen Therapy: Patient Spontanous Breathing  Post-op Pain: mild  Post-op Assessment: Post-op Vital signs reviewed  Post-op Vital Signs: stable  Complications: No apparent anesthesia complications

## 2011-04-18 NOTE — Anesthesia Preprocedure Evaluation (Addendum)
Anesthesia Evaluation  Patient identified by MRN, date of birth, ID band Patient awake    Reviewed: Allergy & Precautions, H&P , NPO status , Patient's Chart, lab work & pertinent test results  Airway Mallampati: II      Dental   Pulmonary          Cardiovascular hypertension, Pt. on medications     Neuro/Psych    GI/Hepatic   Endo/Other    Renal/GU      Musculoskeletal   Abdominal   Peds  Hematology   Anesthesia Other Findings   Reproductive/Obstetrics                        Anesthesia Physical Anesthesia Plan  ASA: III  Anesthesia Plan: General   Post-op Pain Management:    Induction: Intravenous  Airway Management Planned: Mask  Additional Equipment:   Intra-op Plan:   Post-operative Plan:   Informed Consent: I have reviewed the patients History and Physical, chart, labs and discussed the procedure including the risks, benefits and alternatives for the proposed anesthesia with the patient or authorized representative who has indicated his/her understanding and acceptance.   Dental advisory given  Plan Discussed with: CRNA and Anesthesiologist  Anesthesia Plan Comments:         Anesthesia Quick Evaluation

## 2011-04-18 NOTE — Op Note (Signed)
[facility]  CARDIOVERSION NOTE   Procedure: Electrical Cardioversion Indications:  Atrial Fibrillation and Rapid Ventricular Response -- difficult to control rate  Performing MD: Tinnie Kunin W Anesthesiologist: Dr. Randa Evens  Procedure Details:  Consent: Risks of procedure as well as the alternatives and risks of each were explained to the (patient/caregiver).  Consent for procedure obtained. Signed by patient.  Time Out: Verified patient identification, verified procedure, site/side was marked, verified correct patient position, special equipment/implants available, medications/allergies/relevent history reviewed, required imaging and test results available.  Performed  Patient placed on cardiac monitor, pulse oximetry, supplemental oxygen as necessary.  Sedation given: Propophol - 80mg  Pacer pads placed anterior and posterior chest.  Cardioverted 2 time(s).  Cardioverted at 200J. Successful on 2nd attempt  Evaluation: Findings: Post procedure EKG shows: NSR Complications: None Patient did tolerate procedure well.  Time Spent Directly with the Patient:  15 minutes   Marykay Lex, MD Attending Interventional Cardiologist [facility]  Naziyah Tieszen W 04/18/2011, 2:48 PM

## 2011-04-18 NOTE — Transfer of Care (Signed)
Immediate Anesthesia Transfer of Care Note  Patient: Allison Sellers  Procedure(s) Performed:  CARDIOVERSION  Patient Location: Short Stay  Anesthesia Type: General  Level of Consciousness: awake, alert  and oriented  Airway & Oxygen Therapy: Patient Spontanous Breathing and Patient connected to nasal cannula oxygen  Post-op Assessment: Report given to PACU RN and Post -op Vital signs reviewed and stable  Post vital signs: Reviewed and stable  Complications: No apparent anesthesia complications

## 2011-04-19 ENCOUNTER — Encounter (HOSPITAL_COMMUNITY): Payer: Self-pay | Admitting: Cardiology

## 2011-08-21 ENCOUNTER — Encounter: Payer: Self-pay | Admitting: Family Medicine

## 2011-08-21 ENCOUNTER — Ambulatory Visit (INDEPENDENT_AMBULATORY_CARE_PROVIDER_SITE_OTHER): Payer: Medicare Other | Admitting: Family Medicine

## 2011-08-21 VITALS — BP 130/74 | HR 66 | Temp 97.8°F | Wt 142.2 lb

## 2011-08-21 DIAGNOSIS — I4891 Unspecified atrial fibrillation: Secondary | ICD-10-CM

## 2011-08-21 DIAGNOSIS — I10 Essential (primary) hypertension: Secondary | ICD-10-CM

## 2011-08-21 NOTE — Assessment & Plan Note (Signed)
Per cardiology ?On xaralto ?

## 2011-08-21 NOTE — Assessment & Plan Note (Signed)
Controlled-- con't meds rto cpe 3 months

## 2011-08-21 NOTE — Progress Notes (Signed)
  Subjective:    Patient here for follow-up of elevated blood pressure and f/u a fib. Pt is f/u from hospital.   She  is adherent to a low-salt diet.  Blood pressure is well controlled at home. Cardiac symptoms: none. Patient denies: chest pain, chest pressure/discomfort, claudication, dyspnea, exertional chest pressure/discomfort, fatigue, irregular heart beat, lower extremity edema, near-syncope, orthopnea, palpitations, paroxysmal nocturnal dyspnea, syncope and tachypnea. Cardiovascular risk factors: advanced age (older than 54 for men, 40 for women) and hypertension. Use of agents associated with hypertension: none. History of target organ damage: none.  The following portions of the patient's history were reviewed and updated as appropriate: allergies, current medications, past family history, past medical history, past social history, past surgical history and problem list.  Review of Systems Pertinent items are noted in HPI.     Objective:    BP 130/74  Pulse 66  Temp(Src) 97.8 F (36.6 C) (Oral)  Wt 142 lb 3.2 oz (64.501 kg)  SpO2 99% General appearance: alert, cooperative, appears stated age and no distress Lungs: clear to auscultation bilaterally Heart: S1, S2 normal and regular today Extremities: extremities normal, atraumatic, no cyanosis or edema    Assessment:    Hypertension, normal blood pressure . Evidence of target organ damage: none.    A fib--per cardio Plan:    Medication: no change. Dietary sodium restriction. Regular aerobic exercise. Check blood pressures 2-3 times weekly and record. Follow up: 3 months and as needed.

## 2011-08-21 NOTE — Patient Instructions (Signed)
Atrial Fibrillation Your caregiver has diagnosed you with atrial fibrillation (AFib). The heart normally beats very regularly; AFib is a type of irregular heartbeat. The heart rate may be faster or slower than normal. This can prevent your heart from pumping as well as it should. AFib can be constant (chronic) or intermittent (paroxysmal). CAUSES  Atrial fibrillation may be caused by:  Heart disease, including heart attack, coronary artery disease, heart failure, diseases of the heart valves, and others.   Blood clot in the lungs (pulmonary embolism).   Pneumonia or other infections.   Chronic lung disease.   Thyroid disease.   Toxins. These include alcohol, some medications (such as decongestant medications or diet pills), and caffeine.  In some people, no cause for AFib can be found. This is referred to as Lone Atrial Fibrillation. SYMPTOMS   Palpitations or a fluttering in your chest.   A vague sense of chest discomfort.   Shortness of breath.   Sudden onset of lightheadedness or weakness.  Sometimes, the first sign of AFib can be a complication of the condition. This could be a stroke or heart failure. DIAGNOSIS  Your description of your condition may make your caregiver suspicious of atrial fibrillation. Your caregiver will examine your pulse to determine if fibrillation is present. An EKG (electrocardiogram) will confirm the diagnosis. Further testing may help determine what caused you to have atrial fibrillation. This may include chest x-ray, echocardiogram, blood tests, or CT scans. PREVENTION  If you have previously had atrial fibrillation, your caregiver may advise you to avoid substances known to cause the condition (such as stimulant medications, and possibly caffeine or alcohol). You may be advised to use medications to prevent recurrence. Proper treatment of any underlying condition is important to help prevent recurrence. PROGNOSIS  Atrial fibrillation does tend to  become a chronic condition over time. It can cause significant complications (see below). Atrial fibrillation is not usually immediately life-threatening, but it can shorten your life expectancy. This seems to be worse in women. If you have lone atrial fibrillation and are under 60 years old, the risk of complications is very low, and life expectancy is not shortened. RISKS AND COMPLICATIONS  Complications of atrial fibrillation can include stroke, chest pain, and heart failure. Your caregiver will recommend treatments for the atrial fibrillation, as well as for any underlying conditions, to help minimize risk of complications. TREATMENT  Treatment for AFib is divided into several categories:  Treatment of any underlying condition.   Converting you out of AFib into a regular (sinus) rhythm.   Controlling rapid heart rate.   Prevention of blood clots and stroke.  Medications and procedures are available to convert your atrial fibrillation to sinus rhythm. However, recent studies have shown that this may not offer you any advantage, and cardiac experts are continuing research and debate on this topic. More important is controlling your rapid heartbeat. The rapid heartbeat causes more symptoms, and places strain on your heart. Your caregiver will advise you on the use of medications that can control your heart rate. Atrial fibrillation is a strong stroke risk. You can lessen this risk by taking blood thinning medications such as Coumadin (warfarin), or sometimes aspirin. These medications need close monitoring by your caregiver. Over-medication can cause bleeding. Too little medication may not protect against stroke. HOME CARE INSTRUCTIONS   If your caregiver prescribed medicine to make your heartbeat more normally, take as directed.   If blood thinners were prescribed by your caregiver, take EXACTLY as directed.     Perform blood tests EXACTLY as directed.   Quit smoking. Smoking increases your  cardiac and lung (pulmonary) risks.   DO NOT drink alcohol.   DO NOT drink caffeinated drinks (e.g. coffee, soda, chocolate, and leaf teas). You may drink decaffeinated coffee, soda or tea.   If you are overweight, you should choose a reduced calorie diet to lose weight. Please see a registered dietitian if you need more information about healthy weight loss. DO NOT USE DIET PILLS as they may aggravate heart problems.   If you have other heart problems that are causing AFib, you may need to eat a low salt, fat, and cholesterol diet. Your caregiver will tell you if this is necessary.   Exercise every day to improve your physical fitness. Stay active unless advised otherwise.   If your caregiver has given you a follow-up appointment, it is very important to keep that appointment. Not keeping the appointment could result in heart failure or stroke. If there is any problem keeping the appointment, you must call back to this facility for assistance.  SEEK MEDICAL CARE IF:  You notice a change in the rate, rhythm or strength of your heartbeat.   You develop an infection or any other change in your overall health status.  SEEK IMMEDIATE MEDICAL CARE IF:   You develop chest pain, abdominal pain, sweating, weakness or feel sick to your stomach (nausea).   You develop shortness of breath.   You develop swollen feet and ankles.   You develop dizziness, numbness, or weakness of your face or limbs, or any change in vision or speech.  MAKE SURE YOU:   Understand these instructions.   Will watch your condition.   Will get help right away if you are not doing well or get worse.  Document Released: 05/15/2005 Document Revised: 05/04/2011 Document Reviewed: 12/18/2007 [employer] Patient Information 2012 [employer]. 

## 2011-11-21 ENCOUNTER — Encounter: Payer: Self-pay | Admitting: Family Medicine

## 2011-11-21 ENCOUNTER — Ambulatory Visit (INDEPENDENT_AMBULATORY_CARE_PROVIDER_SITE_OTHER): Payer: Medicare Other | Admitting: Family Medicine

## 2011-11-21 VITALS — BP 130/76 | HR 54 | Temp 98.0°F | Wt 141.6 lb

## 2011-11-21 DIAGNOSIS — I10 Essential (primary) hypertension: Secondary | ICD-10-CM

## 2011-11-21 DIAGNOSIS — L659 Nonscarring hair loss, unspecified: Secondary | ICD-10-CM

## 2011-11-21 DIAGNOSIS — I4891 Unspecified atrial fibrillation: Secondary | ICD-10-CM

## 2011-11-21 LAB — POCT URINALYSIS DIPSTICK
Bilirubin, UA: NEGATIVE
Blood, UA: NEGATIVE
Glucose, UA: NEGATIVE
Leukocytes, UA: NEGATIVE
Nitrite, UA: NEGATIVE
Urobilinogen, UA: 0.2

## 2011-11-21 LAB — HEPATIC FUNCTION PANEL
ALT: 16 U/L (ref 0–35)
AST: 19 U/L (ref 0–37)
Alkaline Phosphatase: 58 U/L (ref 39–117)
Bilirubin, Direct: 0.2 mg/dL (ref 0.0–0.3)
Total Bilirubin: 1.2 mg/dL (ref 0.3–1.2)
Total Protein: 7.3 g/dL (ref 6.0–8.3)

## 2011-11-21 LAB — CBC WITH DIFFERENTIAL/PLATELET
Basophils Relative: 0.7 % (ref 0.0–3.0)
Eosinophils Relative: 4.1 % (ref 0.0–5.0)
MCV: 95.8 fl (ref 78.0–100.0)
Monocytes Absolute: 0.7 10*3/uL (ref 0.1–1.0)
Monocytes Relative: 9.1 % (ref 3.0–12.0)
Neutrophils Relative %: 63.5 % (ref 43.0–77.0)
Platelets: 278 10*3/uL (ref 150.0–400.0)
RBC: 4.49 Mil/uL (ref 3.87–5.11)
WBC: 7.4 10*3/uL (ref 4.5–10.5)

## 2011-11-21 LAB — LIPID PANEL
LDL Cholesterol: 49 mg/dL (ref 0–99)
Total CHOL/HDL Ratio: 2

## 2011-11-21 LAB — BASIC METABOLIC PANEL
BUN: 23 mg/dL (ref 6–23)
Chloride: 104 mEq/L (ref 96–112)
Creatinine, Ser: 1.2 mg/dL (ref 0.4–1.2)
GFR: 45.21 mL/min — ABNORMAL LOW (ref >60.00)
Potassium: 4.5 mEq/L (ref 3.5–5.1)

## 2011-11-21 LAB — T4, FREE: Free T4: 0.92 ng/dL (ref 0.60–1.60)

## 2011-11-21 LAB — T3, FREE: T3, Free: 2.9 pg/mL (ref 2.3–4.2)

## 2011-11-21 LAB — TSH: TSH: 1.89 u[IU]/mL (ref 0.35–5.50)

## 2011-11-21 NOTE — Patient Instructions (Signed)

## 2011-11-21 NOTE — Progress Notes (Signed)
  Subjective:    Patient here for follow-up of elevated blood pressure.  She is exercising and is adherent to a low-salt diet.  Blood pressure is well controlled at home. Cardiac symptoms: none. Patient denies: chest pain, chest pressure/discomfort, claudication, dyspnea, exertional chest pressure/discomfort, fatigue, irregular heart beat, lower extremity edema, near-syncope, orthopnea, palpitations, paroxysmal nocturnal dyspnea, syncope and tachypnea. Cardiovascular risk factors: advanced age (older than 79 for men, 68 for women) and hypertension. Use of agents associated with hypertension: none. History of target organ damage: none.  The following portions of the patient's history were reviewed and updated as appropriate: allergies, current medications, past family history, past medical history, past social history, past surgical history and problem list.  Review of Systems Pertinent items are noted in HPI.     Objective:    BP 130/76  Pulse 54  Temp 98 F (36.7 C) (Oral)  Wt 141 lb 9.6 oz (64.229 kg)  SpO2 97% General appearance: alert, cooperative, appears stated age and no distress Lungs: clear to auscultation bilaterally Heart: irreg , irreg Extremities: extremities normal, atraumatic, no cyanosis or edema    Assessment:    Hypertension, normal blood pressure . Evidence of target organ damage: none.   a fib--per cardiology Plan:    Medication: no change. Dietary sodium restriction. Regular aerobic exercise. Check blood pressures 2-3 times weekly and record. Follow up: 6 months and as needed.

## 2011-11-22 ENCOUNTER — Encounter: Payer: Self-pay | Admitting: *Deleted

## 2012-05-27 ENCOUNTER — Ambulatory Visit (INDEPENDENT_AMBULATORY_CARE_PROVIDER_SITE_OTHER): Payer: Medicare Other | Admitting: Family Medicine

## 2012-05-27 ENCOUNTER — Encounter: Payer: Self-pay | Admitting: Family Medicine

## 2012-05-27 VITALS — BP 130/70 | HR 50 | Temp 97.8°F | Wt 140.8 lb

## 2012-05-27 DIAGNOSIS — I10 Essential (primary) hypertension: Secondary | ICD-10-CM

## 2012-05-27 LAB — POCT URINALYSIS DIPSTICK
Bilirubin, UA: NEGATIVE
Blood, UA: NEGATIVE
Glucose, UA: NEGATIVE
Spec Grav, UA: 1.02
Urobilinogen, UA: 0.2
pH, UA: 6

## 2012-05-27 LAB — HEPATIC FUNCTION PANEL
ALT: 10 U/L (ref 0–35)
Total Bilirubin: 1 mg/dL (ref 0.3–1.2)

## 2012-05-27 LAB — LIPID PANEL
Cholesterol: 127 mg/dL (ref 0–200)
HDL: 52.2 mg/dL (ref >39.00)
LDL Cholesterol: 56 mg/dL (ref 0–99)
Triglycerides: 94 mg/dL (ref 0.0–149.0)

## 2012-05-27 LAB — BASIC METABOLIC PANEL
BUN: 21 mg/dL (ref 6–23)
Calcium: 9.3 mg/dL (ref 8.4–10.5)
Chloride: 105 mEq/L (ref 96–112)
Creatinine, Ser: 1.2 mg/dL (ref 0.4–1.2)

## 2012-05-27 MED ORDER — METOPROLOL TARTRATE 50 MG PO TABS: 50.0000 mg | ORAL_TABLET | Freq: Two times a day (BID) | ORAL | Status: DC

## 2012-05-27 NOTE — Patient Instructions (Signed)

## 2012-05-27 NOTE — Progress Notes (Signed)
  Subjective:    Patient here for follow-up of elevated blood pressure-- her daughter is present.   She is not exercising and is adherent to a low-salt diet.  Blood pressure is well controlled at home. Cardiac symptoms: none. Patient denies: chest pain, chest pressure/discomfort, claudication, dyspnea, exertional chest pressure/discomfort, fatigue, irregular heart beat, lower extremity edema, near-syncope, orthopnea, palpitations, paroxysmal nocturnal dyspnea, syncope and tachypnea. Cardiovascular risk factors: advanced age (older than 27 for men, 65 for women), hypertension and sedentary lifestyle. Use of agents associated with hypertension: none. History of target organ damage: none. Pt c/o of occassional loose stool-- will occur very rarely and is aware it may be med or diet.  Pt does not want anything done now about it.    The following portions of the patient's history were reviewed and updated as appropriate: allergies, current medications, past family history, past medical history, past social history, past surgical history and problem list.  Review of Systems Pertinent items are noted in HPI.     Objective:    BP 130/70  Pulse 50  Temp 97.8 F (36.6 C) (Oral)  Wt 140 lb 12.8 oz (63.866 kg)  SpO2 97% General appearance: alert, cooperative, appears stated age and no distress Lungs: clear to auscultation bilaterally Heart: S1, S2 normal Extremities: extremities normal, atraumatic, no cyanosis or edema    Assessment:    Hypertension, normal blood pressure . Evidence of target organ damage: none.   Loose stools--  Only occasionally,  Not recently,  rto if becomes more often Plan:    Medication: no change. Dietary sodium restriction. Regular aerobic exercise. Check blood pressures 2-3 times weekly and record. Follow up: 6 months and as needed.  meds written by cardiology-- Dr Herbie Baltimore Check labs today

## 2012-10-08 ENCOUNTER — Telehealth: Payer: Self-pay | Admitting: Cardiology

## 2012-10-08 DIAGNOSIS — I10 Essential (primary) hypertension: Secondary | ICD-10-CM

## 2012-10-08 MED ORDER — METOPROLOL TARTRATE 50 MG PO TABS: 50.0000 mg | ORAL_TABLET | Freq: Two times a day (BID) | ORAL | Status: DC

## 2012-10-08 NOTE — Telephone Encounter (Signed)
Needs a prescription refill on Metoprolol 50mg ..Pharmacy -[facility] on [address] .Marland [phone]

## 2012-10-08 NOTE — Telephone Encounter (Signed)
Refill sent.  Pt. Informed.

## 2012-10-08 NOTE — Telephone Encounter (Signed)
Requested paper chart.

## 2012-10-23 ENCOUNTER — Other Ambulatory Visit: Payer: Self-pay | Admitting: Cardiology

## 2012-10-28 ENCOUNTER — Other Ambulatory Visit: Payer: Self-pay | Admitting: *Deleted

## 2012-10-28 ENCOUNTER — Telehealth: Payer: Self-pay | Admitting: *Deleted

## 2012-10-28 MED ORDER — RIVAROXABAN 15 MG PO TABS: 15.0000 mg | ORAL_TABLET | Freq: Every day | ORAL | Status: DC

## 2012-10-28 NOTE — Telephone Encounter (Signed)
Pt returned about Xarelto refill.  Informed Rx sent already.  Samples given to pt until she can refill. (2) bottles.  Pt verbalized understanding and agreed w/ plan.

## 2012-10-28 NOTE — Telephone Encounter (Signed)
Message from pt that she was having difficulty w/ refill on meds.  Pt left office prior to RN seeing pt.  Refill request for xarelto received and sent to pharmacy for pt.  Call to pt and left message refill sent.

## 2012-11-25 ENCOUNTER — Ambulatory Visit: Payer: Medicare Other | Admitting: Family Medicine

## 2012-12-03 ENCOUNTER — Ambulatory Visit (INDEPENDENT_AMBULATORY_CARE_PROVIDER_SITE_OTHER): Payer: Medicare Other | Admitting: Family Medicine

## 2012-12-03 ENCOUNTER — Encounter: Payer: Self-pay | Admitting: Family Medicine

## 2012-12-03 VITALS — BP 140/70 | HR 57 | Temp 98.0°F | Wt 141.6 lb

## 2012-12-03 DIAGNOSIS — E2839 Other primary ovarian failure: Secondary | ICD-10-CM

## 2012-12-03 DIAGNOSIS — I10 Essential (primary) hypertension: Secondary | ICD-10-CM

## 2012-12-03 DIAGNOSIS — I4891 Unspecified atrial fibrillation: Secondary | ICD-10-CM

## 2012-12-03 DIAGNOSIS — M479 Spondylosis, unspecified: Secondary | ICD-10-CM

## 2012-12-03 NOTE — Assessment & Plan Note (Signed)
Per cardiology ?On xaralto ?

## 2012-12-03 NOTE — Patient Instructions (Addendum)

## 2012-12-03 NOTE — Progress Notes (Signed)
  Subjective:    Patient here for follow-up of elevated blood pressure.  She is not exercising and is adherent to a low-salt diet.  Blood pressure is well controlled at home. Cardiac symptoms: none. Patient denies: chest pain, chest pressure/discomfort, claudication, dyspnea, exertional chest pressure/discomfort, fatigue, irregular heart beat, lower extremity edema, near-syncope, orthopnea, palpitations, paroxysmal nocturnal dyspnea, syncope and tachypnea. Cardiovascular risk factors: advanced age (older than 53 for men, 22 for women) and hypertension. Use of agents associated with hypertension: none. History of target organ damage: none.  Pt also c/o low back pain-- was told she had arthritis      The following portions of the patient's history were reviewed and updated as appropriate: allergies, current medications, past family history, past medical history, past social history, past surgical history and problem list.  Review of Systems Pertinent items are noted in HPI.     Objective:    BP 140/70  Pulse 57  Temp(Src) 98 F (36.7 C) (Oral)  Wt 141 lb 9.6 oz (64.229 kg)  BMI 27.65 kg/m2  SpO2 97% General appearance: alert, cooperative, appears stated age and no distress Neck: no adenopathy, supple, symmetrical, trachea midline and thyroid not enlarged, symmetric, no tenderness/mass/nodules Lungs: clear to auscultation bilaterally Heart: S1, S2 normal Extremities: extremities normal, atraumatic, no cyanosis or edema    Assessment:    Hypertension, normal blood pressure . Evidence of target organ damage: none.    Plan:    Medication: no change. Dietary sodium restriction. Regular aerobic exercise. Follow up: 3 months and as needed.

## 2012-12-23 ENCOUNTER — Ambulatory Visit (INDEPENDENT_AMBULATORY_CARE_PROVIDER_SITE_OTHER)
Admission: RE | Admit: 2012-12-23 | Discharge: 2012-12-23 | Disposition: A | Discharge status: Home / Self Care | Payer: Medicare Other | Source: Ambulatory Visit | Attending: Family Medicine | Admitting: Family Medicine

## 2012-12-23 DIAGNOSIS — E2839 Other primary ovarian failure: Secondary | ICD-10-CM

## 2013-02-17 ENCOUNTER — Encounter: Payer: Self-pay | Admitting: Cardiology

## 2013-02-17 ENCOUNTER — Ambulatory Visit (INDEPENDENT_AMBULATORY_CARE_PROVIDER_SITE_OTHER): Payer: Medicare Other | Admitting: Cardiology

## 2013-02-17 VITALS — BP 160/78 | HR 60 | Ht 61.0 in | Wt 148.5 lb

## 2013-02-17 DIAGNOSIS — I519 Heart disease, unspecified: Secondary | ICD-10-CM

## 2013-02-17 DIAGNOSIS — I5189 Other ill-defined heart diseases: Secondary | ICD-10-CM

## 2013-02-17 DIAGNOSIS — I10 Essential (primary) hypertension: Secondary | ICD-10-CM

## 2013-02-17 DIAGNOSIS — I4891 Unspecified atrial fibrillation: Secondary | ICD-10-CM

## 2013-02-17 DIAGNOSIS — Z7901 Long term (current) use of anticoagulants: Secondary | ICD-10-CM

## 2013-02-17 MED ORDER — METOPROLOL TARTRATE 50 MG PO TABS: 25.0000 mg | ORAL_TABLET | Freq: Two times a day (BID) | ORAL | Status: DC

## 2013-02-17 NOTE — Assessment & Plan Note (Signed)
Sub optimal control 

## 2013-02-17 NOTE — Progress Notes (Signed)
02/17/2013 Allison Sellers   [birth year]  [id number]  Primary Physicia Loreen Freud, DO Primary Cardiologist: Dr Herbie Baltimore  HPI:   The patient is a very pleasant 79 year old woman with a history of paroxysmal atrial fibrillation and borderline hypertension who was initially evaluated for atypical symptoms of chest discomfort and underwent a Myoview stress test that had noted some ischemia back in November of 2012. Echo in Oct 2012 showed grade 2 diastolic dysfunction with Nl LVF.  That was when she went into atrial fibrillation with RVR. She failed cardioversion and failed high doses of diltiazem and was finally rhythm controlled on Multaq and anticoagulated with Xarelto. She is here for a 6 month follow up. She is doing well from a cardiac stand point. She has had infrequent, brief, episodes of palpitations but no sustained tachycardia. She denies any chest pain or SOB. She has not had syncope or pre syncope. She had been instructed to take Metoprolol 50 mg BID if her HR was greater than 60 but she says it's almost always in the 50s and she has been taking 25 mg BID.    Current Outpatient Prescriptions  Medication Sig Dispense Refill  . aspirin 81 MG tablet Take 81 mg by mouth daily.        Marland Kitchen dronedarone (MULTAQ) 400 MG tablet Take 400 mg by mouth 2 (two) times daily with a meal.      . metoprolol (LOPRESSOR) 50 MG tablet Take 0.5 tablets (25 mg total) by mouth 2 (two) times daily.  90 tablet  3  . nitroGLYCERIN (NITROSTAT) 0.4 MG SL tablet Place 0.4 mg under the tongue every 5 (five) minutes as needed. For chest pain       . Rivaroxaban (XARELTO) 15 MG TABS tablet Take 1 tablet (15 mg total) by mouth daily.  30 tablet  11  . [DISCONTINUED] digoxin (LANOXIN) 0.125 MG tablet Take 125 mcg by mouth daily.         No current facility-administered medications for this visit.    No Known Allergies  History   Social History  . Marital Status: Widowed    Spouse Name: N/A    Number of Children: N/A   . Years of Education: N/A   Occupational History  . Not on file.   Social History Main Topics  . Smoking status: Never Smoker   . Smokeless tobacco: Never Used  . Alcohol Use: No  . Drug Use: No  . Sexual Activity: Not on file   Other Topics Concern  . Not on file   Social History Narrative  . No narrative on file     Review of Systems: General: negative for chills, fever, night sweats or weight changes.  Cardiovascular: negative for chest pain, dyspnea on exertion, edema, orthopnea, palpitations, paroxysmal nocturnal dyspnea or shortness of breath Dermatological: negative for rash Respiratory: negative for cough or wheezing Urologic: negative for hematuria Abdominal: negative for nausea, vomiting, diarrhea, bright red blood per rectum, melena, or hematemesis Neurologic: negative for visual changes, syncope, or dizziness All other systems reviewed and are otherwise negative except as noted above.    Blood pressure 160/78, pulse 60, height 5\' 1"  (1.549 m), weight 148 lb 8 oz (67.359 kg).  General appearance: alert, cooperative and no distress Neck: no carotid bruit and no JVD Lungs: few velcro crackles bilat Heart: regular rate and rhythm Extremities: no edema  EKG NSR- 60  ASSESSMENT AND PLAN:   Atrial fibrillation with RVR Holding NSR on Multaq  HYPERTENSION Sub  optimal control  Diastolic dysfunction- grade 2 by echo Oct 2012 .  Chronic anticoagulation Xarelto   PLAN  I reviewed the patients medications with the patient and her daughter who accompanied her today. I asked her to be careful about her salt intake. Repeat B/P by me was 158/78. I suggested she may need another agent for HTN if her B/P is not better controlled, especially in light of her grade 2 diastolic dysfunction by echo. She will follow up with her primary MD in a month and her B/P can be reviewed at that time. If her B/P is not controlled with adjustment in her diet she would probably do well  with the addition of Norvasc or an ACE. She will follow up with Dr Herbie Baltimore in 6 months.  [facility] 02/17/2013 4:36 PM

## 2013-02-17 NOTE — Assessment & Plan Note (Signed)
Holding NSR on Multaq 

## 2013-02-17 NOTE — Patient Instructions (Signed)
Your physician recommends that you schedule a follow-up appointment in: 6 months with Dr Herbie Baltimore Low salt diet

## 2013-02-17 NOTE — Assessment & Plan Note (Signed)
Xarelto

## 2013-03-10 ENCOUNTER — Encounter: Payer: Self-pay | Admitting: Cardiology

## 2013-03-24 ENCOUNTER — Ambulatory Visit (INDEPENDENT_AMBULATORY_CARE_PROVIDER_SITE_OTHER): Payer: Medicare Other | Admitting: Family Medicine

## 2013-03-24 ENCOUNTER — Encounter: Payer: Self-pay | Admitting: Family Medicine

## 2013-03-24 VITALS — BP 170/80 | HR 56 | Temp 97.3°F | Wt 145.0 lb

## 2013-03-24 DIAGNOSIS — I10 Essential (primary) hypertension: Secondary | ICD-10-CM

## 2013-03-24 DIAGNOSIS — Z23 Encounter for immunization: Secondary | ICD-10-CM

## 2013-03-24 MED ORDER — LISINOPRIL 10 MG PO TABS: 10.0000 mg | ORAL_TABLET | Freq: Every day | ORAL | Status: DC

## 2013-03-24 NOTE — Patient Instructions (Signed)

## 2013-03-24 NOTE — Progress Notes (Signed)
  Subjective:    Patient here for follow-up of elevated blood pressure.  She is not exercising and is adherent to a low-salt diet.  Blood pressure is not well controlled at home. Cardiac symptoms: none. Patient denies: chest pain, chest pressure/discomfort, claudication, dyspnea, exertional chest pressure/discomfort, fatigue, irregular heart beat, lower extremity edema, near-syncope, orthopnea, palpitations, paroxysmal nocturnal dyspnea, syncope and tachypnea. Cardiovascular risk factors: advanced age (older than 75 for men, 19 for women) and hypertension. Use of agents associated with hypertension: none. History of target organ damage: none.  The following portions of the patient's history were reviewed and updated as appropriate: allergies, current medications, past family history, past medical history, past social history, past surgical history and problem list.  Review of Systems Pertinent items are noted in HPI.     Objective:    BP 150/82  Pulse 56  Temp(Src) 97.3 F (36.3 C) (Oral)  Wt 145 lb (65.772 kg)  BMI 27.41 kg/m2  SpO2 96% Repeat bp 170/80 General appearance: alert, cooperative, appears stated age and no distress Throat: lips, mucosa, and tongue normal; teeth and gums normal Neck: no adenopathy, supple, symmetrical, trachea midline and thyroid not enlarged, symmetric, no tenderness/mass/nodules Lungs: clear to auscultation bilaterally Heart: S1, S2 normal Extremities: extremities normal, atraumatic, no cyanosis or edema    Assessment:    Hypertension, stage 1 . Evidence of target organ damage: none.    Plan:    Medication: continue metoprolol and begin lisinopril. Regular aerobic exercise. Check blood pressures 2-3 times weekly and record. Follow up: 3 weeks and as needed.

## 2013-03-26 ENCOUNTER — Telehealth: Payer: Self-pay | Admitting: *Deleted

## 2013-03-26 NOTE — Telephone Encounter (Signed)
Spoke with patient and she said she read the pamphlet that came with the med's and it said to use caution in elderly patients. I made her aware that she has to be aware that her BP doesn't drop too low. She voiced understanding and stated her Bp was stable ranging in the 137/62-74. She has been made aware to continue to monitor her BP and follow as directed       KP

## 2013-03-26 NOTE — Telephone Encounter (Signed)
03/26/2013  [name] left note on my desk stating:  Allison Sellers,  Please call Allison Sellers [phone].  She has questions about side effects on medication.  bw

## 2013-04-14 ENCOUNTER — Encounter: Payer: Self-pay | Admitting: Family Medicine

## 2013-04-14 ENCOUNTER — Ambulatory Visit (INDEPENDENT_AMBULATORY_CARE_PROVIDER_SITE_OTHER): Payer: Medicare Other | Admitting: Family Medicine

## 2013-04-14 VITALS — BP 142/70 | HR 60 | Temp 97.8°F | Wt 145.6 lb

## 2013-04-14 DIAGNOSIS — I10 Essential (primary) hypertension: Secondary | ICD-10-CM

## 2013-04-14 DIAGNOSIS — I4891 Unspecified atrial fibrillation: Secondary | ICD-10-CM

## 2013-04-14 MED ORDER — LISINOPRIL 10 MG PO TABS: 10.0000 mg | ORAL_TABLET | Freq: Every day | ORAL | Status: DC

## 2013-04-14 NOTE — Progress Notes (Signed)
Pre visit review using our clinic review tool, if applicable. No additional management support is needed unless otherwise documented below in the visit note. 

## 2013-04-14 NOTE — Assessment & Plan Note (Signed)
Stable con't meds 

## 2013-04-14 NOTE — Assessment & Plan Note (Signed)
Samples of xaralto given to the pt to help with cost-- her rx plan starts Jan 1

## 2013-04-14 NOTE — Progress Notes (Signed)
  Subjective:    Patient here for follow-up of elevated blood pressure.  She is not exercising and is adherent to a low-salt diet.  Blood pressure is well controlled at home. Cardiac symptoms: none. Patient denies: chest pain, chest pressure/discomfort, claudication, dyspnea, exertional chest pressure/discomfort, fatigue, irregular heart beat, lower extremity edema, near-syncope, orthopnea, palpitations, paroxysmal nocturnal dyspnea, syncope and tachypnea. Cardiovascular risk factors: advanced age (older than 46 for men, 81 for women), hypertension and sedentary lifestyle. Use of agents associated with hypertension: none. History of target organ damage: none.  The following portions of the patient's history were reviewed and updated as appropriate: allergies, current medications, past family history, past medical history, past social history, past surgical history and problem list.  Review of Systems Pertinent items are noted in HPI.     Objective:    BP 142/70  Pulse 60  Temp(Src) 97.8 F (36.6 C) (Oral)  Wt 145 lb 9.6 oz (66.044 kg)  SpO2 96% General appearance: alert, cooperative, appears stated age and no distress Neck: no adenopathy, supple, symmetrical, trachea midline and thyroid not enlarged, symmetric, no tenderness/mass/nodules Lungs: clear to auscultation bilaterally Heart: S1, S2 normal Extremities: extremities normal, atraumatic, no cyanosis or edema    Assessment:    Hypertension, normal blood pressure . Evidence of target organ damage: none.    Plan:    Medication: no change. Dietary sodium restriction. Regular aerobic exercise. Check blood pressures 2-3 times weekly and record. Follow up: 3 months and as needed.

## 2013-04-14 NOTE — Patient Instructions (Addendum)
rto 6 months for bp check and labs Schedule physical if not already scheduled     Hypertension As your heart beats, it forces blood through your arteries. This force is your blood pressure. If the pressure is too high, it is called hypertension (HTN) or high blood pressure. HTN is dangerous because you may have it and not know it. High blood pressure may mean that your heart has to work harder to pump blood. Your arteries may be narrow or stiff. The extra work puts you at risk for heart disease, stroke, and other problems.  Blood pressure consists of two numbers, a higher number over a lower, 110/72, for example. It is stated as "110 over 72." The ideal is below 120 for the top number (systolic) and under 80 for the bottom (diastolic). Write down your blood pressure today. You should pay close attention to your blood pressure if you have certain conditions such as:  Heart failure.  Prior heart attack.  Diabetes  Chronic kidney disease.  Prior stroke.  Multiple risk factors for heart disease. To see if you have HTN, your blood pressure should be measured while you are seated with your arm held at the level of the heart. It should be measured at least twice. A one-time elevated blood pressure reading (especially in the Emergency Department) does not mean that you need treatment. There may be conditions in which the blood pressure is different between your right and left arms. It is important to see your caregiver soon for a recheck. Most people have essential hypertension which means that there is not a specific cause. This type of high blood pressure may be lowered by changing lifestyle factors such as:  Stress.  Smoking.  Lack of exercise.  Excessive weight.  Drug/tobacco/alcohol use.  Eating less salt. Most people do not have symptoms from high blood pressure until it has caused damage to the body. Effective treatment can often prevent, delay or reduce that damage. TREATMENT    When a cause has been identified, treatment for high blood pressure is directed at the cause. There are a large number of medications to treat HTN. These fall into several categories, and your caregiver will help you select the medicines that are best for you. Medications may have side effects. You should review side effects with your caregiver. If your blood pressure stays high after you have made lifestyle changes or started on medicines,   Your medication(s) may need to be changed.  Other problems may need to be addressed.  Be certain you understand your prescriptions, and know how and when to take your medicine.  Be sure to follow up with your caregiver within the time frame advised (usually within two weeks) to have your blood pressure rechecked and to review your medications.  If you are taking more than one medicine to lower your blood pressure, make sure you know how and at what times they should be taken. Taking two medicines at the same time can result in blood pressure that is too low. SEEK IMMEDIATE MEDICAL CARE IF:  You develop a severe headache, blurred or changing vision, or confusion.  You have unusual weakness or numbness, or a faint feeling.  You have severe chest or abdominal pain, vomiting, or breathing problems. MAKE SURE YOU:   Understand these instructions.  Will watch your condition.  Will get help right away if you are not doing well or get worse. Document Released: 05/15/2005 Document Revised: 08/07/2011 Document Reviewed: 01/03/2008 [employer] Patient Information  2014 [employer]  

## 2013-05-26 ENCOUNTER — Ambulatory Visit (INDEPENDENT_AMBULATORY_CARE_PROVIDER_SITE_OTHER): Payer: Medicare Other | Admitting: Family Medicine

## 2013-05-26 ENCOUNTER — Encounter: Payer: Self-pay | Admitting: Family Medicine

## 2013-05-26 VITALS — BP 130/72 | HR 54 | Temp 98.0°F | Wt 144.8 lb

## 2013-05-26 DIAGNOSIS — R102 Pelvic and perineal pain: Secondary | ICD-10-CM

## 2013-05-26 DIAGNOSIS — R82998 Other abnormal findings in urine: Secondary | ICD-10-CM

## 2013-05-26 DIAGNOSIS — N949 Unspecified condition associated with female genital organs and menstrual cycle: Secondary | ICD-10-CM

## 2013-05-26 LAB — POCT URINALYSIS DIPSTICK
Bilirubin, UA: NEGATIVE
Blood, UA: NEGATIVE
Glucose, UA: NEGATIVE
Ketones, UA: NEGATIVE
Nitrite, UA: NEGATIVE
Urobilinogen, UA: 0.2

## 2013-05-26 MED ORDER — CIPROFLOXACIN HCL 250 MG PO TABS: 250.0000 mg | ORAL_TABLET | Freq: Two times a day (BID) | ORAL | Status: DC

## 2013-05-26 NOTE — Progress Notes (Signed)
  Subjective:    Allison Sellers is a 79 y.o. female who complains of abnormal smelling urine and pain in the lower abdomen. She has had symptoms for a few days.  The odor and pain is now gone. Patient denies back pain, congestion, cough, fever, headache, rhinitis, sorethroat and vaginal discharge. Patient does not have a history of recurrent UTI. Patient does not have a history of pyelonephritis.   The following portions of the patient's history were reviewed and updated as appropriate: allergies, current medications, past family history, past medical history, past social history, past surgical history and problem list.  Review of Systems Pertinent items are noted in HPI.    Objective:    BP 130/72  Pulse 54  Temp(Src) 98 F (36.7 C) (Oral)  Wt 144 lb 12.8 oz (65.681 kg)  SpO2 97% General appearance: alert, cooperative, appears stated age and no distress Abdomen: soft, non-tender; bowel sounds normal; no masses,  no organomegaly  Laboratory:  Urine dipstick: trace for leukocyte esterase.   Micro exam: not done.    Assessment:    dysuria     Plan:    Maintain adequate hydration. rx cipro given in case symptoms return  Culture sent

## 2013-05-26 NOTE — Patient Instructions (Signed)

## 2013-05-26 NOTE — Progress Notes (Signed)
Pre visit review using our clinic review tool, if applicable. No additional management support is needed unless otherwise documented below in the visit note. 

## 2013-05-29 LAB — URINE CULTURE: Colony Count: >=100000

## 2013-08-11 ENCOUNTER — Telehealth: Payer: Self-pay | Admitting: *Deleted

## 2013-08-11 NOTE — Telephone Encounter (Signed)
Patient stopped office. Left a letter concerning  Her medication Xarelto. It needs a prior authorization.  RN CALLED [employer] [phone]. Authorization ([employer] plan) was done over the phone. It was approved.  Notified patient via answer machine.

## 2013-08-25 ENCOUNTER — Ambulatory Visit: Payer: Medicare Other | Admitting: Cardiology

## 2013-09-01 ENCOUNTER — Ambulatory Visit: Payer: Medicare Other | Admitting: Cardiology

## 2013-09-10 ENCOUNTER — Ambulatory Visit: Payer: Medicare Other | Admitting: Cardiology

## 2013-09-14 ENCOUNTER — Encounter: Payer: Self-pay | Admitting: Cardiology

## 2013-09-19 ENCOUNTER — Ambulatory Visit (INDEPENDENT_AMBULATORY_CARE_PROVIDER_SITE_OTHER): Payer: Medicare Other | Admitting: Cardiology

## 2013-09-19 ENCOUNTER — Encounter: Payer: Self-pay | Admitting: Cardiology

## 2013-09-19 VITALS — BP 130/60 | HR 59 | Ht 60.0 in | Wt 147.1 lb

## 2013-09-19 DIAGNOSIS — I5189 Other ill-defined heart diseases: Secondary | ICD-10-CM

## 2013-09-19 DIAGNOSIS — I10 Essential (primary) hypertension: Secondary | ICD-10-CM

## 2013-09-19 DIAGNOSIS — I4891 Unspecified atrial fibrillation: Secondary | ICD-10-CM

## 2013-09-19 DIAGNOSIS — I519 Heart disease, unspecified: Secondary | ICD-10-CM

## 2013-09-19 DIAGNOSIS — Z7901 Long term (current) use of anticoagulants: Secondary | ICD-10-CM

## 2013-09-19 MED ORDER — NITROGLYCERIN 0.4 MG SL SUBL: 0.4000 mg | SUBLINGUAL_TABLET | SUBLINGUAL | Status: DC | PRN

## 2013-09-19 MED ORDER — DRONEDARONE HCL 400 MG PO TABS: 400.0000 mg | ORAL_TABLET | Freq: Two times a day (BID) | ORAL | Status: DC

## 2013-09-19 MED ORDER — RIVAROXABAN 15 MG PO TABS: 15.0000 mg | ORAL_TABLET | Freq: Every day | ORAL | Status: DC

## 2013-09-19 NOTE — Progress Notes (Signed)
PATIENT: Allison Sellers MRN: [id number]  DOB: 1927/04/21   DOV:09/22/2013 PCP: Garnet Koyanagi, DO  Clinic Note: Chief Complaint  Patient presents with  . 6 month visit    no chest pain , no sob with strenous activity, no edema, no fast heart rate    HPI: Allison Sellers is a 80 y.o.  female with a PMH below who presents today for annual followup of her atrial fibrillation. I last saw her in March of 2014. At that time she was on Xarelto for anticoagulation and Multaq for rhythm control with metoprolol for rate control. She was doing relatively well at that time and no changes were made. She saw Kerin Ransom, PA-C. in September and was doing relatively well with exception of suboptimal hypertensive control.   Interval History: She continues to do very well from a cardiac standpoint. Far she can tell, she does not note any recurrent episodes of atrial fibrillation. She only notes very fleeting irregular heartbeats. She is no longer having the issue of itching with taking her medications. For the most part, however she basically is doing very well with no anginal or heart failure symptoms. No recurrent sinus atrial fibrillation. No bleeding issues with Xarelto - no melena, hematochezia, hematuria, epistaxis. She does have more so than usual bruising, but is not overly concerned. No syncope/near-syncope, TIA/amaurosis fugax symptoms. No PND, orthopnea or edema. No claudication.  Past Medical History  Diagnosis Date  . Hypertension   . Osteopenia   . Atrial fibrillation     Prior Cardiac Evaluation and Past Surgical History: Past Surgical History  Procedure Laterality Date  . Hernia repair    . Cardioversion  04/18/2011    Procedure: CARDIOVERSION;  Surgeon: Leonie Man;  Location: [facility] OR;  Service: Cardiovascular;  Laterality: N/A;  . Lexiscan myoview  04/14/2011    No scintigraphic evidence of inducible myocardial ischemia, EKG negative for ischemia, patient developed Atrial Fibrillation  with rapid ventricular response during stress and persisted in the recovery period, abnormal myocardial perfusion study  . 2d echocardiogram  03/29/2011    EF 55-65%, normal    No Known Allergies  Current Outpatient Prescriptions  Medication Sig Dispense Refill  . aspirin 81 MG tablet Take 81 mg by mouth daily.        Marland Kitchen dronedarone (MULTAQ) 400 MG tablet Take 1 tablet (400 mg total) by mouth 2 (two) times daily with a meal.  180 tablet  3  . lisinopril (PRINIVIL,ZESTRIL) 10 MG tablet Take 1 tablet (10 mg total) by mouth daily.  90 tablet  3  . metoprolol (LOPRESSOR) 50 MG tablet Take 0.5 tablets (25 mg total) by mouth 2 (two) times daily.  90 tablet  3  . nitroGLYCERIN (NITROSTAT) 0.4 MG SL tablet Place 1 tablet (0.4 mg total) under the tongue every 5 (five) minutes as needed. For chest pain  25 tablet  6  . Rivaroxaban (XARELTO) 15 MG TABS tablet Take 1 tablet (15 mg total) by mouth daily.  90 tablet  3  . [DISCONTINUED] digoxin (LANOXIN) 0.125 MG tablet Take 125 mcg by mouth daily.         No current facility-administered medications for this visit.    History   Social History Narrative   She is a widowed mother of 46, grandmother of 96, great grandmother of 43. She is usually very active and likes to walk .    recently.    She quit smoking 50 years ago. She denies any  alcohol.    She says in order to try to make up for the fact she has not been doing much exercise she has been walking up and down the stairs at the house and staying as active as possible, just doing whatever activity she can do.    ROS: A comprehensive Review of Systems - Negative except Mild bruising, mild expected or osteoarthritis symptoms.  PHYSICAL EXAM BP 130/60  Pulse 59  Ht 5' (1.524 m)  Wt 147 lb 1.6 oz (66.724 kg)  BMI 28.73 kg/m2 General appearance: alert, cooperative, appears stated age, no distress and Well-nourished and well-groomed, pleasant mood and affect. Neck: no adenopathy, no carotid bruit, no  JVD and supple, symmetrical, trachea midline Lungs: clear to auscultation bilaterally, normal percussion bilaterally and Nonlabored, good air movement Heart: RRR, normal S1 and S2. Soft HSM at left lower sternal border without radiation. No other rubs or gallops. Nondisplaced PMI. Abdomen: soft, non-tender; bowel sounds normal; no masses,  no organomegaly Extremities: extremities normal, atraumatic, no cyanosis or edema Pulses: 2+ and symmetric Neurologic: Grossly normal   Adult ECG Report  Rate: 59 ;  Rhythm: normal sinus rhythm/ mild sinus bradycardia; otherwise normal axis and normal intervals.  Narrative Interpretation: Normal EKG  Recent Labs: None available  ASSESSMENT / PLAN:  Doing well, very stable overall from a cardiac standpoint. Well controlled A. fib with Multaq.  Paroxysmal atrial fibrillation - controlled with Multaq and metoprolol Well-controlled rhythm with multaq. No longer having the issues with itching side effects. Anticoagulated with Xarelto on the lower dose based upon age.  Diastolic dysfunction- grade 2 by echo Oct 2012 No active heart failure symptoms. On beta blocker and ACE inhibitor  Chronic anticoagulation On Xarelto. No bleeding complications  HYPERTENSION Stable and beta blocker and ACE inhibitor. She is taking metoprolol 25 twice a day and lisinopril 10 mg daily.    Orders Placed This Encounter  Procedures  . EKG 12-Lead   Meds ordered this encounter  Medications  . nitroGLYCERIN (NITROSTAT) 0.4 MG SL tablet    Sig: Place 1 tablet (0.4 mg total) under the tongue every 5 (five) minutes as needed. For chest pain    Dispense:  25 tablet    Refill:  6  . dronedarone (MULTAQ) 400 MG tablet    Sig: Take 1 tablet (400 mg total) by mouth 2 (two) times daily with a meal.    Dispense:  180 tablet    Refill:  3  . Rivaroxaban (XARELTO) 15 MG TABS tablet    Sig: Take 1 tablet (15 mg total) by mouth daily.    Dispense:  90 tablet    Refill:  3     Followup: 1 yr  DAVID W. Ellyn Hack, M.D., M.S. Interventional Cardiology [facility]

## 2013-09-19 NOTE — Patient Instructions (Signed)
Continue current medication  Your physician wants you to follow-up in 12 months Dr Harding.  You will receive a reminder letter in the mail two months in advance. If you don't receive a letter, please call our office to schedule the follow-up appointment.  

## 2013-09-22 ENCOUNTER — Encounter: Payer: Self-pay | Admitting: Cardiology

## 2013-09-22 NOTE — Assessment & Plan Note (Signed)
Stable and beta blocker and ACE inhibitor. She is taking metoprolol 25 twice a day and lisinopril 10 mg daily.

## 2013-09-22 NOTE — Assessment & Plan Note (Signed)
Well-controlled rhythm with multaq. No longer having the issues with itching side effects. Anticoagulated with Xarelto on the lower dose based upon age.

## 2013-09-22 NOTE — Assessment & Plan Note (Signed)
On Xarelto. No bleeding complications

## 2013-09-22 NOTE — Assessment & Plan Note (Signed)
No active heart failure symptoms. On beta blocker and ACE inhibitor

## 2013-11-13 ENCOUNTER — Encounter: Payer: Self-pay | Admitting: Family Medicine

## 2013-11-13 ENCOUNTER — Ambulatory Visit (INDEPENDENT_AMBULATORY_CARE_PROVIDER_SITE_OTHER): Payer: Medicare Other | Admitting: Family Medicine

## 2013-11-13 VITALS — BP 118/78 | HR 62 | Temp 98.3°F | Wt 144.0 lb

## 2013-11-13 DIAGNOSIS — J441 Chronic obstructive pulmonary disease with (acute) exacerbation: Secondary | ICD-10-CM

## 2013-11-13 MED ORDER — ALBUTEROL SULFATE (2.5 MG/3ML) 0.083% IN NEBU
2.5000 mg | INHALATION_SOLUTION | Freq: Once | RESPIRATORY_TRACT | Status: AC
  Administered 2013-11-13: 2.5 mg via RESPIRATORY_TRACT

## 2013-11-13 MED ORDER — AMOXICILLIN-POT CLAVULANATE 875-125 MG PO TABS: 1.0000 | ORAL_TABLET | Freq: Two times a day (BID) | ORAL | Status: DC

## 2013-11-13 MED ORDER — PREDNISONE 10 MG PO TABS: ORAL_TABLET | ORAL | Status: DC

## 2013-11-13 NOTE — Progress Notes (Signed)
Subjective:     Allison Sellers is a 80 y.o. female here for evaluation of a cough. Onset of symptoms was a few days ago. Symptoms have been gradually worsening since that time. The cough is barky and productive and is aggravated by infection, pollens and reclining position. Associated symptoms include: postnasal drip, shortness of breath, sputum production and wheezing. Patient does not have a history of asthma. Patient does have a history of environmental allergens. Patient has not traveled recently. Patient does not have a history of smoking. Patient has not had a previous chest x-ray. Patient has not had a PPD done.  The following portions of the patient's history were reviewed and updated as appropriate:  She  has a past medical history of Hypertension; Osteopenia; and Atrial fibrillation. She  does not have any pertinent problems on file. She  has past surgical history that includes Hernia repair; Cardioversion (04/18/2011); Lexiscan Myoview (04/14/2011); and 2D Echocardiogram (03/29/2011). Her family history includes Arthritis in an other family member; Heart failure in her father and mother; Macular degeneration in her mother. She  reports that she has never smoked. She has never used smokeless tobacco. She reports that she does not drink alcohol or use illicit drugs. She has a current medication list which includes the following prescription(s): aspirin, dronedarone, lisinopril, metoprolol, nitroglycerin, rivaroxaban, amoxicillin-clavulanate, and prednisone. Current Outpatient Prescriptions on File Prior to Visit  Medication Sig Dispense Refill  . aspirin 81 MG tablet Take 81 mg by mouth daily.        Marland Kitchen dronedarone (MULTAQ) 400 MG tablet Take 1 tablet (400 mg total) by mouth 2 (two) times daily with a meal.  180 tablet  3  . lisinopril (PRINIVIL,ZESTRIL) 10 MG tablet Take 1 tablet (10 mg total) by mouth daily.  90 tablet  3  . metoprolol (LOPRESSOR) 50 MG tablet Take 0.5 tablets (25 mg  total) by mouth 2 (two) times daily.  90 tablet  3  . nitroGLYCERIN (NITROSTAT) 0.4 MG SL tablet Place 1 tablet (0.4 mg total) under the tongue every 5 (five) minutes as needed. For chest pain  25 tablet  6  . Rivaroxaban (XARELTO) 15 MG TABS tablet Take 1 tablet (15 mg total) by mouth daily.  90 tablet  3  . [DISCONTINUED] digoxin (LANOXIN) 0.125 MG tablet Take 125 mcg by mouth daily.         No current facility-administered medications on file prior to visit.   She is allergic to ciprofloxacin..  Review of Systems Pertinent items are noted in HPI.    Objective:    Oxygen saturation 96% on room air BP 118/78  Pulse 62  Temp(Src) 98.3 F (36.8 C) (Oral)  Wt 144 lb (65.318 kg)  SpO2 96% General appearance: alert, cooperative, appears stated age and no distress Ears: normal TM's and external ear canals both ears Nose: Nares normal. Septum midline. Mucosa normal. No drainage or sinus tenderness. Throat: lips, mucosa, and tongue normal; teeth and gums normal Neck: moderate anterior cervical adenopathy, supple, symmetrical, trachea midline and thyroid not enlarged, symmetric, no tenderness/mass/nodules Lungs: wheezes bilaterally Heart: S1, S2 normal Extremities: extremities normal, atraumatic, no cyanosis or edema    Assessment:    Acute Bronchitis    Plan:    Antibiotics per medication orders. Avoid exposure to tobacco smoke and fumes. Call if shortness of breath worsens, blood in sputum, change in character of cough, development of fever or chills, inability to maintain nutrition and hydration. Avoid exposure to tobacco smoke and  fumes. Trial of antihistamines. Trial of steroid nasal spray.  pred taper  mucinex prn

## 2013-11-13 NOTE — Patient Instructions (Signed)
Bronchitis  Bronchitis is inflammation of the airways that extend from the windpipe into the lungs (bronchi). The inflammation often causes mucus to develop, which leads to a cough. If the inflammation becomes severe, it may cause shortness of breath.  CAUSES   Bronchitis may be caused by:    Viral infections.    Bacteria.    Cigarette smoke.    Allergens, pollutants, and other irritants.   SIGNS AND SYMPTOMS   The most common symptom of bronchitis is a frequent cough that produces mucus. Other symptoms include:   Fever.    Body aches.    Chest congestion.    Chills.    Shortness of breath.    Sore throat.   DIAGNOSIS   Bronchitis is usually diagnosed through a medical history and physical exam. Tests, such as chest X-rays, are sometimes done to rule out other conditions.   TREATMENT   You may need to avoid contact with whatever caused the problem (smoking, for example). Medicines are sometimes needed. These may include:   Antibiotics. These may be prescribed if the condition is caused by bacteria.   Cough suppressants. These may be prescribed for relief of cough symptoms.    Inhaled medicines. These may be prescribed to help open your airways and make it easier for you to breathe.    Steroid medicines. These may be prescribed for those with recurrent (chronic) bronchitis.  HOME CARE INSTRUCTIONS   Get plenty of rest.    Drink enough fluids to keep your urine clear or pale yellow (unless you have a medical condition that requires fluid restriction). Increasing fluids may help thin your secretions and will prevent dehydration.    Only take over-the-counter or prescription medicines as directed by your health care provider.   Only take antibiotics as directed. Make sure you finish them even if you start to feel better.   Avoid secondhand smoke, irritating chemicals, and strong fumes. These will make bronchitis worse. If you are a smoker, quit smoking. Consider using nicotine gum or  skin patches to help control withdrawal symptoms. Quitting smoking will help your lungs heal faster.    Put a cool-mist humidifier in your bedroom at night to moisten the air. This may help loosen mucus. Change the water in the humidifier daily. You can also run the hot water in your shower and sit in the bathroom with the door closed for 5-10 minutes.    Follow up with your health care provider as directed.    Wash your hands frequently to avoid catching bronchitis again or spreading an infection to others.   SEEK MEDICAL CARE IF:  Your symptoms do not improve after 1 week of treatment.   SEEK IMMEDIATE MEDICAL CARE IF:   Your fever increases.   You have chills.    You have chest pain.    You have worsening shortness of breath.    You have bloody sputum.   You faint.   You have lightheadedness.   You have a severe headache.    You vomit repeatedly.  MAKE SURE YOU:    Understand these instructions.   Will watch your condition.   Will get help right away if you are not doing well or get worse.  Document Released: 05/15/2005 Document Revised: 03/05/2013 Document Reviewed: 01/07/2013  [employer] Patient Information 2015 [employer]. This information is not intended to replace advice given to you by your health care provider. Make sure you discuss any questions you have with your health care   provider.

## 2013-11-13 NOTE — Progress Notes (Signed)
Pre visit review using our clinic review tool, if applicable. No additional management support is needed unless otherwise documented below in the visit note. 

## 2014-02-26 ENCOUNTER — Encounter: Payer: Medicare Other | Admitting: Family Medicine

## 2014-03-02 ENCOUNTER — Encounter: Payer: Medicare Other | Admitting: Family Medicine

## 2014-03-16 ENCOUNTER — Other Ambulatory Visit: Payer: Self-pay | Admitting: Cardiology

## 2014-03-16 NOTE — Telephone Encounter (Signed)
Rx was sent to pharmacy electronically. 

## 2014-04-21 ENCOUNTER — Encounter: Payer: Self-pay | Admitting: Family Medicine

## 2014-04-21 ENCOUNTER — Ambulatory Visit (INDEPENDENT_AMBULATORY_CARE_PROVIDER_SITE_OTHER): Payer: Medicare Other | Admitting: Family Medicine

## 2014-04-21 VITALS — BP 116/70 | HR 66 | Temp 97.7°F | Ht 60.0 in | Wt 144.0 lb

## 2014-04-21 DIAGNOSIS — D229 Melanocytic nevi, unspecified: Secondary | ICD-10-CM

## 2014-04-21 DIAGNOSIS — I10 Essential (primary) hypertension: Secondary | ICD-10-CM

## 2014-04-21 DIAGNOSIS — H9193 Unspecified hearing loss, bilateral: Secondary | ICD-10-CM

## 2014-04-21 DIAGNOSIS — Z0000 Encounter for general adult medical examination without abnormal findings: Secondary | ICD-10-CM

## 2014-04-21 DIAGNOSIS — Z1239 Encounter for other screening for malignant neoplasm of breast: Secondary | ICD-10-CM

## 2014-04-21 DIAGNOSIS — E2839 Other primary ovarian failure: Secondary | ICD-10-CM

## 2014-04-21 LAB — CBC WITH DIFFERENTIAL/PLATELET
BASOS PCT: 0.7 % (ref 0.0–3.0)
Basophils Absolute: 0.1 10*3/uL (ref 0.0–0.1)
EOS PCT: 6.1 % — AB (ref 0.0–5.0)
Eosinophils Absolute: 0.5 10*3/uL (ref 0.0–0.7)
HCT: 42.1 % (ref 36.0–46.0)
HEMOGLOBIN: 13.9 g/dL (ref 12.0–15.0)
LYMPHS PCT: 22.1 % (ref 12.0–46.0)
Lymphs Abs: 1.7 10*3/uL (ref 0.7–4.0)
MCHC: 32.9 g/dL (ref 30.0–36.0)
MCV: 95.3 fl (ref 78.0–100.0)
MONOS PCT: 8.4 % (ref 3.0–12.0)
Monocytes Absolute: 0.6 10*3/uL (ref 0.1–1.0)
NEUTROS ABS: 4.8 10*3/uL (ref 1.4–7.7)
Neutrophils Relative %: 62.7 % (ref 43.0–77.0)
Platelets: 287 10*3/uL (ref 150.0–400.0)
RBC: 4.42 Mil/uL (ref 3.87–5.11)
RDW: 13.8 % (ref 11.5–15.5)
WBC: 7.7 10*3/uL (ref 4.0–10.5)

## 2014-04-21 LAB — HEPATIC FUNCTION PANEL
ALBUMIN: 4.1 g/dL (ref 3.5–5.2)
ALK PHOS: 61 U/L (ref 39–117)
ALT: 10 U/L (ref 0–35)
AST: 16 U/L (ref 0–37)
BILIRUBIN DIRECT: 0.1 mg/dL (ref 0.0–0.3)
TOTAL PROTEIN: 7.4 g/dL (ref 6.0–8.3)
Total Bilirubin: 1 mg/dL (ref 0.2–1.2)

## 2014-04-21 LAB — POCT URINALYSIS DIPSTICK
Bilirubin, UA: NEGATIVE
GLUCOSE UA: NEGATIVE
Ketones, UA: NEGATIVE
LEUKOCYTES UA: NEGATIVE
Nitrite, UA: NEGATIVE
Protein, UA: NEGATIVE
RBC UA: NEGATIVE
Spec Grav, UA: >=1.03
UROBILINOGEN UA: 0.2
pH, UA: 5.5

## 2014-04-21 LAB — BASIC METABOLIC PANEL
BUN: 25 mg/dL — ABNORMAL HIGH (ref 6–23)
CALCIUM: 9.1 mg/dL (ref 8.4–10.5)
CO2: 23 meq/L (ref 19–32)
Chloride: 104 mEq/L (ref 96–112)
Creatinine, Ser: 1.3 mg/dL — ABNORMAL HIGH (ref 0.4–1.2)
GFR: 39.95 mL/min — AB (ref >60.00)
Glucose, Bld: 92 mg/dL (ref 70–99)
Potassium: 4.8 mEq/L (ref 3.5–5.1)
SODIUM: 137 meq/L (ref 135–145)

## 2014-04-21 LAB — LIPID PANEL
CHOL/HDL RATIO: 2
Cholesterol: 127 mg/dL (ref 0–200)
HDL: 52.8 mg/dL (ref >39.00)
LDL Cholesterol: 55 mg/dL (ref 0–99)
NONHDL: 74.2
Triglycerides: 97 mg/dL (ref 0.0–149.0)
VLDL: 19.4 mg/dL (ref 0.0–40.0)

## 2014-04-21 MED ORDER — LISINOPRIL 10 MG PO TABS: 10.0000 mg | ORAL_TABLET | Freq: Every day | ORAL | Status: DC

## 2014-04-21 NOTE — Patient Instructions (Signed)
Preventive Care for Adults A healthy lifestyle and preventive care can promote health and wellness. Preventive health guidelines for women include the following key practices.  A routine yearly physical is a good way to check with your health care provider about your health and preventive screening. It is a chance to share any concerns and updates on your health and to receive a thorough exam.  Visit your dentist for a routine exam and preventive care every 6 months. Brush your teeth twice a day and floss once a day. Good oral hygiene prevents tooth decay and gum disease.  The frequency of eye exams is based on your age, health, family medical history, use of contact lenses, and other factors. Follow your health care provider's recommendations for frequency of eye exams.  Eat a healthy diet. Foods like vegetables, fruits, whole grains, low-fat dairy products, and lean protein foods contain the nutrients you need without too many calories. Decrease your intake of foods high in solid fats, added sugars, and salt. Eat the right amount of calories for you.Get information about a proper diet from your health care provider, if necessary.  Regular physical exercise is one of the most important things you can do for your health. Most adults should get at least 150 minutes of moderate-intensity exercise (any activity that increases your heart rate and causes you to sweat) each week. In addition, most adults need muscle-strengthening exercises on 2 or more days a week.  Maintain a healthy weight. The body mass index (BMI) is a screening tool to identify possible weight problems. It provides an estimate of body fat based on height and weight. Your health care provider can find your BMI and can help you achieve or maintain a healthy weight.For adults 20 years and older:  A BMI below 18.5 is considered underweight.  A BMI of 18.5 to 24.9 is normal.  A BMI of 25 to 29.9 is considered overweight.  A BMI of  30 and above is considered obese.  Maintain normal blood lipids and cholesterol levels by exercising and minimizing your intake of saturated fat. Eat a balanced diet with plenty of fruit and vegetables. Blood tests for lipids and cholesterol should begin at age 69 and be repeated every 5 years. If your lipid or cholesterol levels are high, you are over 50, or you are at high risk for heart disease, you may need your cholesterol levels checked more frequently.Ongoing high lipid and cholesterol levels should be treated with medicines if diet and exercise are not working.  If you smoke, find out from your health care provider how to quit. If you do not use tobacco, do not start.  Lung cancer screening is recommended for adults aged 2-80 years who are at high risk for developing lung cancer because of a history of smoking. A yearly low-dose CT scan of the lungs is recommended for people who have at least a 30-pack-year history of smoking and are a current smoker or have quit within the past 15 years. A pack year of smoking is smoking an average of 1 pack of cigarettes a day for 1 year (for example: 1 pack a day for 30 years or 2 packs a day for 15 years). Yearly screening should continue until the smoker has stopped smoking for at least 15 years. Yearly screening should be stopped for people who develop a health problem that would prevent them from having lung cancer treatment.  If you are pregnant, do not drink alcohol. If you are breastfeeding,  be very cautious about drinking alcohol. If you are not pregnant and choose to drink alcohol, do not have more than 1 drink per day. One drink is considered to be 12 ounces (355 mL) of beer, 5 ounces (148 mL) of wine, or 1.5 ounces (44 mL) of liquor.  Avoid use of street drugs. Do not share needles with anyone. Ask for help if you need support or instructions about stopping the use of drugs.  High blood pressure causes heart disease and increases the risk of  stroke. Your blood pressure should be checked at least every 1 to 2 years. Ongoing high blood pressure should be treated with medicines if weight loss and exercise do not work.  If you are 70-31 years old, ask your health care provider if you should take aspirin to prevent strokes.  Diabetes screening involves taking a blood sample to check your fasting blood sugar level. This should be done once every 3 years, after age 44, if you are within normal weight and without risk factors for diabetes. Testing should be considered at a younger age or be carried out more frequently if you are overweight and have at least 1 risk factor for diabetes.  Breast cancer screening is essential preventive care for women. You should practice "breast self-awareness." This means understanding the normal appearance and feel of your breasts and may include breast self-examination. Any changes detected, no matter how small, should be reported to a health care provider. Women in their 66s and 30s should have a clinical breast exam (CBE) by a health care provider as part of a regular health exam every 1 to 3 years. After age 76, women should have a CBE every year. Starting at age 56, women should consider having a mammogram (breast X-ray test) every year. Women who have a family history of breast cancer should talk to their health care provider about genetic screening. Women at a high risk of breast cancer should talk to their health care providers about having an MRI and a mammogram every year.  Breast cancer gene (BRCA)-related cancer risk assessment is recommended for women who have family members with BRCA-related cancers. BRCA-related cancers include breast, ovarian, tubal, and peritoneal cancers. Having family members with these cancers may be associated with an increased risk for harmful changes (mutations) in the breast cancer genes BRCA1 and BRCA2. Results of the assessment will determine the need for genetic counseling and  BRCA1 and BRCA2 testing.  Routine pelvic exams to screen for cancer are no longer recommended for nonpregnant women who are considered low risk for cancer of the pelvic organs (ovaries, uterus, and vagina) and who do not have symptoms. Ask your health care provider if a screening pelvic exam is right for you.  If you have had past treatment for cervical cancer or a condition that could lead to cancer, you need Pap tests and screening for cancer for at least 20 years after your treatment. If Pap tests have been discontinued, your risk factors (such as having a new sexual partner) need to be reassessed to determine if screening should be resumed. Some women have medical problems that increase the chance of getting cervical cancer. In these cases, your health care provider may recommend more frequent screening and Pap tests.  The HPV test is an additional test that may be used for cervical cancer screening. The HPV test looks for the virus that can cause the cell changes on the cervix. The cells collected during the Pap test can be  tested for HPV. The HPV test could be used to screen women aged 53 years and older, and should be used in women of any age who have unclear Pap test results. After the age of 101, women should have HPV testing at the same frequency as a Pap test.  Colorectal cancer can be detected and often prevented. Most routine colorectal cancer screening begins at the age of 52 years and continues through age 57 years. However, your health care provider may recommend screening at an earlier age if you have risk factors for colon cancer. On a yearly basis, your health care provider may provide home test kits to check for hidden blood in the stool. Use of a small camera at the end of a tube, to directly examine the colon (sigmoidoscopy or colonoscopy), can detect the earliest forms of colorectal cancer. Talk to your health care provider about this at age 74, when routine screening begins. Direct  exam of the colon should be repeated every 5-10 years through age 62 years, unless early forms of pre-cancerous polyps or small growths are found.  People who are at an increased risk for hepatitis B should be screened for this virus. You are considered at high risk for hepatitis B if:  You were born in a country where hepatitis B occurs often. Talk with your health care provider about which countries are considered high risk.  Your parents were born in a high-risk country and you have not received a shot to protect against hepatitis B (hepatitis B vaccine).  You have HIV or AIDS.  You use needles to inject street drugs.  You live with, or have sex with, someone who has hepatitis B.  You get hemodialysis treatment.  You take certain medicines for conditions like cancer, organ transplantation, and autoimmune conditions.  Hepatitis C blood testing is recommended for all people born from [birth year] through 1965 and any individual with known risks for hepatitis C.  Practice safe sex. Use condoms and avoid high-risk sexual practices to reduce the spread of sexually transmitted infections (STIs). STIs include gonorrhea, chlamydia, syphilis, trichomonas, herpes, HPV, and human immunodeficiency virus (HIV). Herpes, HIV, and HPV are viral illnesses that have no cure. They can result in disability, cancer, and death.  You should be screened for sexually transmitted illnesses (STIs) including gonorrhea and chlamydia if:  You are sexually active and are younger than 24 years.  You are older than 24 years and your health care provider tells you that you are at risk for this type of infection.  Your sexual activity has changed since you were last screened and you are at an increased risk for chlamydia or gonorrhea. Ask your health care provider if you are at risk.  If you are at risk of being infected with HIV, it is recommended that you take a prescription medicine daily to prevent HIV infection. This is  called preexposure prophylaxis (PrEP). You are considered at risk if:  You are a heterosexual woman, are sexually active, and are at increased risk for HIV infection.  You take drugs by injection.  You are sexually active with a partner who has HIV.  Talk with your health care provider about whether you are at high risk of being infected with HIV. If you choose to begin PrEP, you should first be tested for HIV. You should then be tested every 3 months for as long as you are taking PrEP.  Osteoporosis is a disease in which the bones lose minerals and strength  with aging. This can result in serious bone fractures or breaks. The risk of osteoporosis can be identified using a bone density scan. Women ages 65 years and over and women at risk for fractures or osteoporosis should discuss screening with their health care providers. Ask your health care provider whether you should take a calcium supplement or vitamin D to reduce the rate of osteoporosis.  Menopause can be associated with physical symptoms and risks. Hormone replacement therapy is available to decrease symptoms and risks. You should talk to your health care provider about whether hormone replacement therapy is right for you.  Use sunscreen. Apply sunscreen liberally and repeatedly throughout the day. You should seek shade when your shadow is shorter than you. Protect yourself by wearing long sleeves, pants, a wide-brimmed hat, and sunglasses year round, whenever you are outdoors.  Once a month, do a whole body skin exam, using a mirror to look at the skin on your back. Tell your health care provider of new moles, moles that have irregular borders, moles that are larger than a pencil eraser, or moles that have changed in shape or color.  Stay current with required vaccines (immunizations).  Influenza vaccine. All adults should be immunized every year.  Tetanus, diphtheria, and acellular pertussis (Td, Tdap) vaccine. Pregnant women should  receive 1 dose of Tdap vaccine during each pregnancy. The dose should be obtained regardless of the length of time since the last dose. Immunization is preferred during the 27th-36th week of gestation. An adult who has not previously received Tdap or who does not know her vaccine status should receive 1 dose of Tdap. This initial dose should be followed by tetanus and diphtheria toxoids (Td) booster doses every 10 years. Adults with an unknown or incomplete history of completing a 3-dose immunization series with Td-containing vaccines should begin or complete a primary immunization series including a Tdap dose. Adults should receive a Td booster every 10 years.  Varicella vaccine. An adult without evidence of immunity to varicella should receive 2 doses or a second dose if she has previously received 1 dose. Pregnant females who do not have evidence of immunity should receive the first dose after pregnancy. This first dose should be obtained before leaving the health care facility. The second dose should be obtained 4-8 weeks after the first dose.  Human papillomavirus (HPV) vaccine. Females aged 13-26 years who have not received the vaccine previously should obtain the 3-dose series. The vaccine is not recommended for use in pregnant females. However, pregnancy testing is not needed before receiving a dose. If a female is found to be pregnant after receiving a dose, no treatment is needed. In that case, the remaining doses should be delayed until after the pregnancy. Immunization is recommended for any person with an immunocompromised condition through the age of 26 years if she did not get any or all doses earlier. During the 3-dose series, the second dose should be obtained 4-8 weeks after the first dose. The third dose should be obtained 24 weeks after the first dose and 16 weeks after the second dose.  Zoster vaccine. One dose is recommended for adults aged 60 years or older unless certain conditions are  present.  Measles, mumps, and rubella (MMR) vaccine. Adults born before 1957 generally are considered immune to measles and mumps. Adults born in 1957 or later should have 1 or more doses of MMR vaccine unless there is a contraindication to the vaccine or there is laboratory evidence of immunity to   each of the three diseases. A routine second dose of MMR vaccine should be obtained at least 28 days after the first dose for students attending postsecondary schools, health care workers, or international travelers. People who received inactivated measles vaccine or an unknown type of measles vaccine during 1963-1967 should receive 2 doses of MMR vaccine. People who received inactivated mumps vaccine or an unknown type of mumps vaccine before 1979 and are at high risk for mumps infection should consider immunization with 2 doses of MMR vaccine. For females of childbearing age, rubella immunity should be determined. If there is no evidence of immunity, females who are not pregnant should be vaccinated. If there is no evidence of immunity, females who are pregnant should delay immunization until after pregnancy. Unvaccinated health care workers born before 1957 who lack laboratory evidence of measles, mumps, or rubella immunity or laboratory confirmation of disease should consider measles and mumps immunization with 2 doses of MMR vaccine or rubella immunization with 1 dose of MMR vaccine.  Pneumococcal 13-valent conjugate (PCV13) vaccine. When indicated, a person who is uncertain of her immunization history and has no record of immunization should receive the PCV13 vaccine. An adult aged 19 years or older who has certain medical conditions and has not been previously immunized should receive 1 dose of PCV13 vaccine. This PCV13 should be followed with a dose of pneumococcal polysaccharide (PPSV23) vaccine. The PPSV23 vaccine dose should be obtained at least 8 weeks after the dose of PCV13 vaccine. An adult aged 19  years or older who has certain medical conditions and previously received 1 or more doses of PPSV23 vaccine should receive 1 dose of PCV13. The PCV13 vaccine dose should be obtained 1 or more years after the last PPSV23 vaccine dose.  Pneumococcal polysaccharide (PPSV23) vaccine. When PCV13 is also indicated, PCV13 should be obtained first. All adults aged 65 years and older should be immunized. An adult younger than age 65 years who has certain medical conditions should be immunized. Any person who resides in a nursing home or long-term care facility should be immunized. An adult smoker should be immunized. People with an immunocompromised condition and certain other conditions should receive both PCV13 and PPSV23 vaccines. People with human immunodeficiency virus (HIV) infection should be immunized as soon as possible after diagnosis. Immunization during chemotherapy or radiation therapy should be avoided. Routine use of PPSV23 vaccine is not recommended for American Indians, [place] Natives, or people younger than 65 years unless there are medical conditions that require PPSV23 vaccine. When indicated, people who have unknown immunization and have no record of immunization should receive PPSV23 vaccine. One-time revaccination 5 years after the first dose of PPSV23 is recommended for people aged 19-64 years who have chronic kidney failure, nephrotic syndrome, asplenia, or immunocompromised conditions. People who received 1-2 doses of PPSV23 before age 65 years should receive another dose of PPSV23 vaccine at age 65 years or later if at least 5 years have passed since the previous dose. Doses of PPSV23 are not needed for people immunized with PPSV23 at or after age 65 years.  Meningococcal vaccine. Adults with asplenia or persistent complement component deficiencies should receive 2 doses of quadrivalent meningococcal conjugate (MenACWY-D) vaccine. The doses should be obtained at least 2 months apart.  Microbiologists working with certain meningococcal bacteria, military recruits, people at risk during an outbreak, and people who travel to or live in countries with a high rate of meningitis should be immunized. A first-year college student up through age   21 years who is living in a residence hall should receive a dose if she did not receive a dose on or after her 16th birthday. Adults who have certain high-risk conditions should receive one or more doses of vaccine.  Hepatitis A vaccine. Adults who wish to be protected from this disease, have certain high-risk conditions, work with hepatitis A-infected animals, work in hepatitis A research labs, or travel to or work in countries with a high rate of hepatitis A should be immunized. Adults who were previously unvaccinated and who anticipate close contact with an international adoptee during the first 60 days after arrival in the [place] from a country with a high rate of hepatitis A should be immunized.  Hepatitis B vaccine. Adults who wish to be protected from this disease, have certain high-risk conditions, may be exposed to blood or other infectious body fluids, are household contacts or sex partners of hepatitis B positive people, are clients or workers in certain care facilities, or travel to or work in countries with a high rate of hepatitis B should be immunized.  Haemophilus influenzae type b (Hib) vaccine. A previously unvaccinated person with asplenia or sickle cell disease or having a scheduled splenectomy should receive 1 dose of Hib vaccine. Regardless of previous immunization, a recipient of a hematopoietic stem cell transplant should receive a 3-dose series 6-12 months after her successful transplant. Hib vaccine is not recommended for adults with HIV infection. Preventive Services / Frequency Ages 64 to 68 years  Blood pressure check.** / Every 1 to 2 years.  Lipid and cholesterol check.** / Every 5 years beginning at age  22.  Clinical breast exam.** / Every 3 years for women in their 88s and 53s.  BRCA-related cancer risk assessment.** / For women who have family members with a BRCA-related cancer (breast, ovarian, tubal, or peritoneal cancers).  Pap test.** / Every 2 years from ages 90 through 51. Every 3 years starting at age 21 through age 56 or 3 with a history of 3 consecutive normal Pap tests.  HPV screening.** / Every 3 years from ages 24 through ages 1 to 46 with a history of 3 consecutive normal Pap tests.  Hepatitis C blood test.** / For any individual with known risks for hepatitis C.  Skin self-exam. / Monthly.  Influenza vaccine. / Every year.  Tetanus, diphtheria, and acellular pertussis (Tdap, Td) vaccine.** / Consult your health care provider. Pregnant women should receive 1 dose of Tdap vaccine during each pregnancy. 1 dose of Td every 10 years.  Varicella vaccine.** / Consult your health care provider. Pregnant females who do not have evidence of immunity should receive the first dose after pregnancy.  HPV vaccine. / 3 doses over 6 months, if 72 and younger. The vaccine is not recommended for use in pregnant females. However, pregnancy testing is not needed before receiving a dose.  Measles, mumps, rubella (MMR) vaccine.** / You need at least 1 dose of MMR if you were born in 1957 or later. You may also need a 2nd dose. For females of childbearing age, rubella immunity should be determined. If there is no evidence of immunity, females who are not pregnant should be vaccinated. If there is no evidence of immunity, females who are pregnant should delay immunization until after pregnancy.  Pneumococcal 13-valent conjugate (PCV13) vaccine.** / Consult your health care provider.  Pneumococcal polysaccharide (PPSV23) vaccine.** / 1 to 2 doses if you smoke cigarettes or if you have certain conditions.  Meningococcal vaccine.** /  1 dose if you are age 58 to 21 years and a [place] living in a residence hall, or have one of several medical conditions, you need to get vaccinated against meningococcal disease. You may also need additional booster doses.  Hepatitis A vaccine.** / Consult your health care provider.  Hepatitis B vaccine.** / Consult your health care provider.  Haemophilus influenzae type b (Hib) vaccine.** / Consult your health care provider. Ages 74 to 58 years  Blood pressure check.** / Every 1 to 2 years.  Lipid and cholesterol check.** / Every 5 years beginning at age 72 years.  Lung cancer screening. / Every year if you are aged 62-80 years and have a 30-pack-year history of smoking and currently smoke or have quit within the past 15 years. Yearly screening is stopped once you have quit smoking for at least 15 years or develop a health problem that would prevent you from having lung cancer treatment.  Clinical breast exam.** / Every year after age 59 years.  BRCA-related cancer risk assessment.** / For women who have family members with a BRCA-related cancer (breast, ovarian, tubal, or peritoneal cancers).  Mammogram.** / Every year beginning at age 13 years and continuing for as long as you are in good health. Consult with your health care provider.  Pap test.** / Every 3 years starting at age 17 years through age 10 or 66 years with a history of 3 consecutive normal Pap tests.  HPV screening.** / Every 3 years from ages 41 years through ages 55 to 5 years with a history of 3 consecutive normal Pap tests.  Fecal occult blood test (FOBT) of stool. / Every year beginning at age 73 years and continuing until age 60 years. You may not need to do this test if you get a colonoscopy every 10 years.  Flexible sigmoidoscopy or colonoscopy.** / Every 5 years for a flexible sigmoidoscopy or every 10 years for a colonoscopy beginning at age 82 years and continuing until age 77 years.  Hepatitis C blood test.** / For all people born from [birth year] through  1965 and any individual with known risks for hepatitis C.  Skin self-exam. / Monthly.  Influenza vaccine. / Every year.  Tetanus, diphtheria, and acellular pertussis (Tdap/Td) vaccine.** / Consult your health care provider. Pregnant women should receive 1 dose of Tdap vaccine during each pregnancy. 1 dose of Td every 10 years.  Varicella vaccine.** / Consult your health care provider. Pregnant females who do not have evidence of immunity should receive the first dose after pregnancy.  Zoster vaccine.** / 1 dose for adults aged 32 years or older.  Measles, mumps, rubella (MMR) vaccine.** / You need at least 1 dose of MMR if you were born in 1957 or later. You may also need a 2nd dose. For females of childbearing age, rubella immunity should be determined. If there is no evidence of immunity, females who are not pregnant should be vaccinated. If there is no evidence of immunity, females who are pregnant should delay immunization until after pregnancy.  Pneumococcal 13-valent conjugate (PCV13) vaccine.** / Consult your health care provider.  Pneumococcal polysaccharide (PPSV23) vaccine.** / 1 to 2 doses if you smoke cigarettes or if you have certain conditions.  Meningococcal vaccine.** / Consult your health care provider.  Hepatitis A vaccine.** / Consult your health care provider.  Hepatitis B vaccine.** / Consult your health care provider.  Haemophilus influenzae type b (Hib) vaccine.** / Consult your health care provider. Ages 51  years and over  Blood pressure check.** / Every 1 to 2 years.  Lipid and cholesterol check.** / Every 5 years beginning at age 55 years.  Lung cancer screening. / Every year if you are aged 47-80 years and have a 30-pack-year history of smoking and currently smoke or have quit within the past 15 years. Yearly screening is stopped once you have quit smoking for at least 15 years or develop a health problem that would prevent you from having lung cancer  treatment.  Clinical breast exam.** / Every year after age 50 years.  BRCA-related cancer risk assessment.** / For women who have family members with a BRCA-related cancer (breast, ovarian, tubal, or peritoneal cancers).  Mammogram.** / Every year beginning at age 52 years and continuing for as long as you are in good health. Consult with your health care provider.  Pap test.** / Every 3 years starting at age 42 years through age 27 or 61 years with 3 consecutive normal Pap tests. Testing can be stopped between 65 and 70 years with 3 consecutive normal Pap tests and no abnormal Pap or HPV tests in the past 10 years.  HPV screening.** / Every 3 years from ages 45 years through ages 25 or 51 years with a history of 3 consecutive normal Pap tests. Testing can be stopped between 65 and 70 years with 3 consecutive normal Pap tests and no abnormal Pap or HPV tests in the past 10 years.  Fecal occult blood test (FOBT) of stool. / Every year beginning at age 68 years and continuing until age 37 years. You may not need to do this test if you get a colonoscopy every 10 years.  Flexible sigmoidoscopy or colonoscopy.** / Every 5 years for a flexible sigmoidoscopy or every 10 years for a colonoscopy beginning at age 41 years and continuing until age 6 years.  Hepatitis C blood test.** / For all people born from [birth year] through 1963 and any individual with known risks for hepatitis C.  Osteoporosis screening.** / A one-time screening for women ages 70 years and over and women at risk for fractures or osteoporosis.  Skin self-exam. / Monthly.  Influenza vaccine. / Every year.  Tetanus, diphtheria, and acellular pertussis (Tdap/Td) vaccine.** / 1 dose of Td every 10 years.  Varicella vaccine.** / Consult your health care provider.  Zoster vaccine.** / 1 dose for adults aged 67 years or older.  Pneumococcal 13-valent conjugate (PCV13) vaccine.** / Consult your health care provider.  Pneumococcal  polysaccharide (PPSV23) vaccine.** / 1 dose for all adults aged 42 years and older.  Meningococcal vaccine.** / Consult your health care provider.  Hepatitis A vaccine.** / Consult your health care provider.  Hepatitis B vaccine.** / Consult your health care provider.  Haemophilus influenzae type b (Hib) vaccine.** / Consult your health care provider. ** Family history and personal history of risk and conditions may change your health care provider's recommendations. Document Released: 07/11/2001 Document Revised: 09/29/2013 Document Reviewed: 10/10/2010 [facility] Patient Information 2015 [place], [place]. This information is not intended to replace advice given to you by your health care provider. Make sure you discuss any questions you have with your health care provider.

## 2014-04-21 NOTE — Progress Notes (Signed)
Pre visit review using our clinic review tool, if applicable. No additional management support is needed unless otherwise documented below in the visit note. 

## 2014-04-21 NOTE — Progress Notes (Signed)
Subjective:    Allison Sellers is a 80 y.o. female who presents for Medicare Annual/Subsequent preventive examination.  Preventive Screening-Counseling & Management  Tobacco History  Smoking status  . Never Smoker   Smokeless tobacco  . Never Used     Problems Prior to Visit 1. Hearing loss  Current Problems (verified) Patient Active Problem List   Diagnosis Date Noted  . Chronic anticoagulation 02/17/2013  . Diastolic dysfunction- grade 2 by echo Oct 2012 02/17/2013  . Paroxysmal atrial fibrillation - controlled with Multaq and metoprolol 04/18/2011  . HYPERTENSION 12/26/2007  . OSTEOPENIA 12/26/2007    Medications Prior to Visit Current Outpatient Prescriptions on File Prior to Visit  Medication Sig Dispense Refill  . aspirin 81 MG tablet Take 81 mg by mouth daily.      Marland Kitchen dronedarone (MULTAQ) 400 MG tablet Take 1 tablet (400 mg total) by mouth 2 (two) times daily with a meal. 180 tablet 3  . metoprolol (LOPRESSOR) 50 MG tablet TAKE ONE-HALF TABLET BY MOUTH TWICE DAILY 90 tablet 1  . nitroGLYCERIN (NITROSTAT) 0.4 MG SL tablet Place 1 tablet (0.4 mg total) under the tongue every 5 (five) minutes as needed. For chest pain 25 tablet 6  . Rivaroxaban (XARELTO) 15 MG TABS tablet Take 1 tablet (15 mg total) by mouth daily. 90 tablet 3  . [DISCONTINUED] digoxin (LANOXIN) 0.125 MG tablet Take 125 mcg by mouth daily.       No current facility-administered medications on file prior to visit.    Current Medications (verified) Current Outpatient Prescriptions  Medication Sig Dispense Refill  . aspirin 81 MG tablet Take 81 mg by mouth daily.      Marland Kitchen dronedarone (MULTAQ) 400 MG tablet Take 1 tablet (400 mg total) by mouth 2 (two) times daily with a meal. 180 tablet 3  . lisinopril (PRINIVIL,ZESTRIL) 10 MG tablet Take 1 tablet (10 mg total) by mouth daily. 90 tablet 3  . metoprolol (LOPRESSOR) 50 MG tablet TAKE ONE-HALF TABLET BY MOUTH TWICE DAILY 90 tablet 1  . nitroGLYCERIN  (NITROSTAT) 0.4 MG SL tablet Place 1 tablet (0.4 mg total) under the tongue every 5 (five) minutes as needed. For chest pain 25 tablet 6  . Rivaroxaban (XARELTO) 15 MG TABS tablet Take 1 tablet (15 mg total) by mouth daily. 90 tablet 3  . [DISCONTINUED] digoxin (LANOXIN) 0.125 MG tablet Take 125 mcg by mouth daily.       No current facility-administered medications for this visit.     Allergies (verified) Ciprofloxacin   PAST HISTORY  Family History Family History  Problem Relation Age of Onset  . Arthritis    . Heart failure Father     CHF  . Heart failure Mother     CHF  . Macular degeneration Mother     Social History History  Substance Use Topics  . Smoking status: Never Smoker   . Smokeless tobacco: Never Used  . Alcohol Use: No     Are there smokers in your home (other than you)? No  Risk Factors Current exercise habits: The patient does not participate in regular exercise at present. -- some walking Dietary issues discussed: na  Cardiac risk factors: advanced age (older than 68 for men, 15 for women), hypertension and sedentary lifestyle.  Depression Screen (Note: if answer to either of the following is "Yes", a more complete depression screening is indicated)   Over the past two weeks, have you felt down, depressed or hopeless? No  Over the  past two weeks, have you felt little interest or pleasure in doing things? No  Have you lost interest or pleasure in daily life? No  Do you often feel hopeless? No  Do you cry easily over simple problems? No  Activities of Daily Living In your present state of health, do you have any difficulty performing the following activities?:  Driving? Yes Managing money?  No Feeding yourself? No Getting from bed to chair? NoGClimbing a flight of stairs? No Preparing food and eating?: No Bathing or showering? No Getting dressed: No Getting to the toilet? No Using the toilet:No Moving around from place to place: No In the  past year have you fallen or had a near fall?:No   Are you sexually active?  No  Do you have more than one partner?  No  Hearing Difficulties: Yes Do you often ask people to speak up or repeat themselves? Yes Do you experience ringing or noises in your ears? No Do you have difficulty understanding soft or whispered voices? Yes   Do you feel that you have a problem with memory? No  Do you often misplace items? No  Do you feel safe at home?  Yes  Cognitive Testing  Alert? Yes  Normal Appearance?Yes  Oriented to person? Yes  Place? Yes   Time? Yes  Recall of three objects?  Yes  Can perform simple calculations? Yes  Displays appropriate judgment?Yes  Can read the correct time from a watch face?Yes   Advanced Directives have been discussed with the patient? Yes  List the Names of Other Physician/Practitioners you currently use: 1.  opth-- hollander 2,  Dentist-- araneto 3.  Card-- harding   Indicate any recent Medical Services you may have received from other than [facility] providers in the past year (date may be approximate).  Immunization History  Administered Date(s) Administered  . Influenza Split 03/27/2011  . Influenza Whole 03/23/2010  . Influenza, High Dose Seasonal PF 02/17/2014  . Influenza,inj,Quad PF,36+ Mos 03/24/2013  . Pneumococcal Polysaccharide-23 12/02/1997  . Td 12/26/2007    Screening Tests Health Maintenance  Topic Date Due  . ZOSTAVAX  04/20/1989  . COLONOSCOPY  05/27/2000  . INFLUENZA VACCINE  12/28/2014  . TETANUS/TDAP  12/25/2017  . PNEUMOCOCCAL POLYSACCHARIDE VACCINE AGE 30 AND OVER  Completed    All answers were reviewed with the patient and necessary referrals were made:  Garnet Koyanagi, DO   04/21/2014   History reviewed:  She  has a past medical history of Hypertension; Osteopenia; and Atrial fibrillation. She  does not have any pertinent problems on file. She  has past surgical history that includes Hernia repair; Cardioversion  (04/18/2011); Lexiscan Myoview (04/14/2011); and 2D Echocardiogram (03/29/2011). Her family history includes Arthritis in an other family member; Heart failure in her father and mother; Macular degeneration in her mother. She  reports that she has never smoked. She has never used smokeless tobacco. She reports that she does not drink alcohol or use illicit drugs. She has a current medication list which includes the following prescription(s): aspirin, dronedarone, lisinopril, metoprolol, nitroglycerin, and rivaroxaban. Current Outpatient Prescriptions on File Prior to Visit  Medication Sig Dispense Refill  . aspirin 81 MG tablet Take 81 mg by mouth daily.      Marland Kitchen dronedarone (MULTAQ) 400 MG tablet Take 1 tablet (400 mg total) by mouth 2 (two) times daily with a meal. 180 tablet 3  . metoprolol (LOPRESSOR) 50 MG tablet TAKE ONE-HALF TABLET BY MOUTH TWICE DAILY 90 tablet 1  .  nitroGLYCERIN (NITROSTAT) 0.4 MG SL tablet Place 1 tablet (0.4 mg total) under the tongue every 5 (five) minutes as needed. For chest pain 25 tablet 6  . Rivaroxaban (XARELTO) 15 MG TABS tablet Take 1 tablet (15 mg total) by mouth daily. 90 tablet 3  . [DISCONTINUED] digoxin (LANOXIN) 0.125 MG tablet Take 125 mcg by mouth daily.       No current facility-administered medications on file prior to visit.   She is allergic to ciprofloxacin.  Review of Systems  Review of Systems  Constitutional: Negative for activity change, appetite change and fatigue.  HENT: Negative for hearing loss, congestion, tinnitus and ear discharge.   Eyes: Negative for visual disturbance (see optho q1y -- vision corrected to 20/20 with glasses).  Respiratory: Negative for cough, chest tightness and shortness of breath.   Cardiovascular: Negative for chest pain, palpitations and leg swelling.  Gastrointestinal: Negative for abdominal pain, diarrhea, constipation and abdominal distention.  Genitourinary: Negative for urgency, frequency, decreased  urine volume and difficulty urinating.  Musculoskeletal: Negative for back pain, arthralgias and gait problem.  Skin: Negative for color change, pallor and rash.  Neurological: Negative for dizziness, light-headedness, numbness and headaches.  Hematological: Negative for adenopathy. Does not bruise/bleed easily.  Psychiatric/Behavioral: Negative for suicidal ideas, confusion, sleep disturbance, self-injury, dysphoric mood, decreased concentration and agitation.  Pt is able to read and write and can do all ADLs No risk for falling No abuse/ violence in home      Objective:   Vision by Snellen chart: opth  Body mass index is 28.12 kg/(m^2). BP 116/70 mmHg  Pulse 66  Temp(Src) 97.7 F (36.5 C) (Oral)  Ht 5' (1.524 m)  Wt 144 lb (65.318 kg)  BMI 28.12 kg/m2  SpO2 97%  BP 116/70 mmHg  Pulse 66  Temp(Src) 97.7 F (36.5 C) (Oral)  Ht 5' (1.524 m)  Wt 144 lb (65.318 kg)  BMI 28.12 kg/m2  SpO2 97% General appearance: alert, cooperative, appears stated age and no distress Head: Normocephalic, without obvious abnormality, atraumatic Eyes: negative findings: lids and lashes normal and pupils equal, round, reactive to light and accomodation Ears: normal TM's and external ear canals both ears Nose: Nares normal. Septum midline. Mucosa normal. No drainage or sinus tenderness. Throat: lips, mucosa, and tongue normal; teeth and gums normal Neck: no adenopathy, no carotid bruit, no JVD, supple, symmetrical, trachea midline and thyroid not enlarged, symmetric, no tenderness/mass/nodules Back: negative Lungs: clear to auscultation bilaterally Breasts: normal appearance, no masses or tenderness Heart: S1, S2 normal+ soft murmur Abdomen: soft, non-tender; bowel sounds normal; no masses,  no organomegaly Pelvic: not indicated; post-menopausal, no abnormal Pap smears in past Extremities: extremities normal, atraumatic, no cyanosis or edema Pulses: 2+ and symmetric Skin: + lesion on top of  scalp-- x 1 month, not healing Lymph nodes: Cervical, supraclavicular, and axillary nodes normal. Neurologic: Alert and oriented X 3, normal strength and tone. Normal symmetric reflexes. Normal coordination and gait Psych--no depression, no anxiety      Assessment:     cpe      Plan:     During the course of the visit the patient was educated and counseled about appropriate screening and preventive services including:    Influenza vaccine  Screening mammography  Bone densitometry screening  Diabetes screening  Glaucoma screening  Advanced directives: has an advanced directive - a copy HAS NOT been provided. Pt refused prevnar 13 and mammogram Diet review for nutrition referral? Yes ____  Not Indicated __x__  Patient Instructions (the written plan) was given to the patient.  Medicare Attestation I have personally reviewed: The patient's medical and social history Their use of alcohol, tobacco or illicit drugs Their current medications and supplements The patient's functional ability including ADLs,fall risks, home safety risks, cognitive, and hearing and visual impairment Diet and physical activities Evidence for depression or mood disorders  The patient's weight, height, BMI, and visual acuity have been recorded in the chart.  I have made referrals, counseling, and provided education to the patient based on review of the above and I have provided the patient with a written personalized care plan for preventive services.    1. Essential hypertension   - lisinopril (PRINIVIL,ZESTRIL) 10 MG tablet; Take 1 tablet (10 mg total) by mouth daily.  Dispense: 90 tablet; Refill: 3 - Basic metabolic panel - CBC with Differential - Hepatic function panel - Lipid panel - POCT urinalysis dipstick  2. Suspicious nevus   - Ambulatory referral to Dermatology  3. Breast screening Pt refusing mamogram  4. Estrogen deficiency  - DG Bone Density; Future  5. Hearing  loss, bilateral  - Ambulatory referral to ENT  6 Medicare annual wellness visit, subsequent    Garnet Koyanagi, DO   04/21/2014

## 2014-04-27 ENCOUNTER — Other Ambulatory Visit: Payer: Self-pay

## 2014-04-27 DIAGNOSIS — R799 Abnormal finding of blood chemistry, unspecified: Secondary | ICD-10-CM

## 2014-04-27 DIAGNOSIS — R7989 Other specified abnormal findings of blood chemistry: Secondary | ICD-10-CM

## 2014-04-29 ENCOUNTER — Inpatient Hospital Stay: Admission: RE | Admit: 2014-04-29 | Payer: Medicare Other | Source: Ambulatory Visit

## 2014-05-11 ENCOUNTER — Other Ambulatory Visit: Payer: Medicare Other

## 2014-05-12 ENCOUNTER — Other Ambulatory Visit (INDEPENDENT_AMBULATORY_CARE_PROVIDER_SITE_OTHER): Payer: Medicare Other

## 2014-05-12 DIAGNOSIS — R748 Abnormal levels of other serum enzymes: Secondary | ICD-10-CM

## 2014-05-12 DIAGNOSIS — R7989 Other specified abnormal findings of blood chemistry: Secondary | ICD-10-CM

## 2014-05-12 DIAGNOSIS — R799 Abnormal finding of blood chemistry, unspecified: Secondary | ICD-10-CM

## 2014-05-12 LAB — BASIC METABOLIC PANEL
BUN: 23 mg/dL (ref 6–23)
CO2: 22 meq/L (ref 19–32)
Calcium: 9.2 mg/dL (ref 8.4–10.5)
Chloride: 103 mEq/L (ref 96–112)
Creatinine, Ser: 1.4 mg/dL — ABNORMAL HIGH (ref 0.4–1.2)
GFR: 39.61 mL/min — ABNORMAL LOW (ref >60.00)
GLUCOSE: 134 mg/dL — AB (ref 70–99)
Potassium: 4.5 mEq/L (ref 3.5–5.1)
SODIUM: 133 meq/L — AB (ref 135–145)

## 2014-05-18 ENCOUNTER — Telehealth: Payer: Self-pay | Admitting: Family Medicine

## 2014-05-18 DIAGNOSIS — R739 Hyperglycemia, unspecified: Secondary | ICD-10-CM

## 2014-05-18 DIAGNOSIS — N289 Disorder of kidney and ureter, unspecified: Secondary | ICD-10-CM

## 2014-05-18 NOTE — Telephone Encounter (Signed)
Ref placed.      KP 

## 2014-05-18 NOTE — Telephone Encounter (Signed)
Do not see referral, can one be placed?

## 2014-05-18 NOTE — Telephone Encounter (Signed)
Caller name: susan- Relation to pt: daughter Call back number:  [phone] Pharmacy:  Reason for call:   Patient daughter wants to know why Dr. Etter Sjogren is referring her to a neprologist. She also wants to know patient last labs. Manuela Schwartz also wants to know if medication that patient is taking could cause lab levels to go up

## 2014-05-18 NOTE — Telephone Encounter (Addendum)
Glucose high---- even for randomsugar Recheck fasting with hgba1c---- Hyperglycemia Kidney function worsening as well---- inc po fluids ----repeat bmp--- elevate creatinine   Discussed with patient and Allison Sellers and they both voiced understanding. The patient has agreed to seeing Nephrology and will come in tomorrow to recheck BMP and A1c. She would like for [name] to schedule the nephrology apt.         KP

## 2014-05-19 ENCOUNTER — Other Ambulatory Visit (INDEPENDENT_AMBULATORY_CARE_PROVIDER_SITE_OTHER): Payer: Medicare Other

## 2014-05-19 DIAGNOSIS — R739 Hyperglycemia, unspecified: Secondary | ICD-10-CM

## 2014-05-19 LAB — BASIC METABOLIC PANEL
BUN: 21 mg/dL (ref 6–23)
CALCIUM: 9 mg/dL (ref 8.4–10.5)
CHLORIDE: 99 meq/L (ref 96–112)
CO2: 23 mEq/L (ref 19–32)
CREATININE: 1.2 mg/dL (ref 0.4–1.2)
GFR: 44.1 mL/min — ABNORMAL LOW (ref >60.00)
Glucose, Bld: 118 mg/dL — ABNORMAL HIGH (ref 70–99)
Potassium: 4.3 mEq/L (ref 3.5–5.1)
Sodium: 130 mEq/L — ABNORMAL LOW (ref 135–145)

## 2014-05-19 LAB — HEMOGLOBIN A1C: HEMOGLOBIN A1C: 5.3 % (ref 4.6–6.5)

## 2014-06-29 ENCOUNTER — Telehealth: Payer: Self-pay | Admitting: Family Medicine

## 2014-06-29 ENCOUNTER — Telehealth: Payer: Self-pay | Admitting: Cardiology

## 2014-06-29 DIAGNOSIS — H9193 Unspecified hearing loss, bilateral: Secondary | ICD-10-CM

## 2014-06-29 DIAGNOSIS — L821 Other seborrheic keratosis: Secondary | ICD-10-CM

## 2014-06-29 HISTORY — DX: Other seborrheic keratosis: L82.1

## 2014-06-29 NOTE — Telephone Encounter (Signed)
LMTCB  Per EPIC - PCP has been checking labs

## 2014-06-29 NOTE — Telephone Encounter (Signed)
Caller name: Manuela Schwartz Relation to pt: daughter Call back number: [phone] Pharmacy:  Reason for call:   Patient scheduled her own appointment to hearing dr and needs referral. She is seeing Dr. Alfonse Ras on 07/07/14 at 2:00pm. Fax: [phone]

## 2014-06-29 NOTE — Telephone Encounter (Signed)
Spoke with daughter and she wanted to see a hearing specialist due to her mother's hearing loss, she said she discussed with Dr.Lowne at the last visit. Ref has been placed.      KP

## 2014-06-29 NOTE — Telephone Encounter (Signed)
Pt's daughter called in wanting to know if she needs to have labs done before coming in to see Coleman. Please f/u  Thanks

## 2014-06-29 NOTE — Telephone Encounter (Signed)
Daughter returned call. Informed her that PCP has been checking labs and are in our computer system

## 2014-08-06 ENCOUNTER — Telehealth: Payer: Self-pay | Admitting: Family Medicine

## 2014-08-06 ENCOUNTER — Telehealth: Payer: Self-pay | Admitting: *Deleted

## 2014-08-06 NOTE — Telephone Encounter (Signed)
Caller name: susan Relation to pt: daughter Call back number: [phone] Pharmacy:  Reason for call:   Manuela Schwartz states that patient likes to see Dr. Etter Sjogren and Dr. Ellyn Hack every six months. Patient will be seeing Dr. Ellyn Hack in May and wants to know if it will be okay for patient to wait to be seen until November with Dr. Etter Sjogren?  Patient will see nephrologist in april

## 2014-08-06 NOTE — Telephone Encounter (Signed)
PATIENT BROUGHT LETTER BY FROM INSURANCE COMPANY- [employer] XARELTO  IS ONLY APPROVED THROUGH 3/06/03/14.  RN INFORMED PATIENT,WILL HAVE  TO CALL A PRIOR AUTHORIZATION FOR UPCOMING YEAR 2017.

## 2014-08-07 NOTE — Telephone Encounter (Signed)
Please advise 

## 2014-08-10 NOTE — Telephone Encounter (Signed)
LM for call back

## 2014-08-10 NOTE — Telephone Encounter (Signed)
As long as Dr Ellyn Hack is ok with that

## 2014-08-10 NOTE — Telephone Encounter (Signed)
Advised daughter of recommendations.

## 2014-08-10 NOTE — Telephone Encounter (Signed)
PRIOR AUTHORIZATION COMPLETED OVER THE PHONE  ([phone]- [employer]) DX-AFIB   CHRONIC ANTICOAG z79.01 ,AFIB I48.91 ; HTN  Y86, DYSTOLIC DYSFX V78.4 MEDICATION WAS APPROVED UNTIL 08/10/15.  RN NOTIFIED PATIENT.

## 2014-09-15 ENCOUNTER — Encounter: Payer: Self-pay | Admitting: Cardiology

## 2014-09-15 ENCOUNTER — Encounter: Payer: Self-pay | Admitting: Cardiovascular Disease

## 2014-09-17 ENCOUNTER — Telehealth: Payer: Self-pay | Admitting: Cardiology

## 2014-09-18 NOTE — Telephone Encounter (Signed)
Close encounter 

## 2014-09-21 NOTE — Progress Notes (Signed)
Quick Note:  Recent Labs: 09/08/2014 [facility] Na+ 138, K+ 5.0, Cl- 97, HCO3- 23 , BUN 22, Cr 1.2, Glu 93, Ca2+ 9.7; Phos 4.0 CBC: W 9.3, H/H 14.1/42.1, Plt 378  ______

## 2014-09-24 ENCOUNTER — Encounter: Payer: Self-pay | Admitting: Family Medicine

## 2014-09-24 ENCOUNTER — Encounter: Payer: Self-pay | Admitting: Cardiology

## 2014-09-24 ENCOUNTER — Ambulatory Visit (INDEPENDENT_AMBULATORY_CARE_PROVIDER_SITE_OTHER): Payer: Medicare Other | Admitting: Cardiology

## 2014-09-24 VITALS — BP 150/50 | HR 63 | Ht 60.0 in | Wt 135.2 lb

## 2014-09-24 DIAGNOSIS — I5189 Other ill-defined heart diseases: Secondary | ICD-10-CM

## 2014-09-24 DIAGNOSIS — Z7901 Long term (current) use of anticoagulants: Secondary | ICD-10-CM | POA: Diagnosis not present

## 2014-09-24 DIAGNOSIS — I519 Heart disease, unspecified: Secondary | ICD-10-CM

## 2014-09-24 DIAGNOSIS — I480 Paroxysmal atrial fibrillation: Secondary | ICD-10-CM | POA: Diagnosis not present

## 2014-09-24 DIAGNOSIS — I10 Essential (primary) hypertension: Secondary | ICD-10-CM

## 2014-09-24 MED ORDER — RIVAROXABAN 15 MG PO TABS: 15.0000 mg | ORAL_TABLET | Freq: Two times a day (BID) | ORAL | Status: DC

## 2014-09-24 MED ORDER — DRONEDARONE HCL 400 MG PO TABS: 400.0000 mg | ORAL_TABLET | Freq: Two times a day (BID) | ORAL | Status: DC

## 2014-09-24 MED ORDER — METOPROLOL TARTRATE 25 MG PO TABS: 12.5000 mg | ORAL_TABLET | Freq: Two times a day (BID) | ORAL | Status: DC

## 2014-09-24 MED ORDER — RIVAROXABAN 15 MG PO TABS: 15.0000 mg | ORAL_TABLET | Freq: Every day | ORAL | Status: DC

## 2014-09-24 NOTE — Patient Instructions (Signed)
Decrease metoprolol tart 12.5mg  (1/2 tablet of 25 mg) twice a day.  Continue all other medications   Your physician wants you to follow-up in 12 month DR HARDING 30 MIN APPT.  You will receive a reminder letter in the mail two months in advance. If you don't receive a letter, please call our office to schedule the follow-up appointment.

## 2014-09-24 NOTE — Progress Notes (Signed)
PATIENT: Allison Sellers MRN: [id number]  DOB: [birth year]   DOV:09/26/2014 PCP: Garnet Koyanagi, DO Dr[facility] - [phone]  Clinic Note: Chief Complaint  Patient presents with  . Annual Exam    feet turned purple last week,  . Atrial Fibrillation    HPI: Allison Sellers is a 81 y.o.  female with a PMH below who presents today for annual followup of her atrial fibrillation. I last saw her in March of 2015 - stable on Xarelto & Multaq, BB & ACE-I. NO CHF Sx.  No rapid HR.  Interval History: She is doing well.  Feels like she tires out a bit easier than she used to. Last summer had an episode of heat exhaustion (in setting of Bronchitis).  She continues to do very well from a cardiac standpoint. Far she can tell, she does not note any recurrent episodes of atrial fibrillation. She only notes very fleeting irregular heartbeats. She is no longer having the issue of itching with taking her medications.   For the most part, however she basically is doing very well with no anginal or heart failure symptoms. No recurrent sinus atrial fibrillation. No bleeding issues with Xarelto - no melena, hematochezia, hematuria, epistaxis. She does have more so than usual bruising, but is not overly concerned. No syncope/near-syncope, TIA/amaurosis fugax symptoms. No PND, orthopnea or edema. No claudication  Cardiovascular ROS: no chest pain or dyspnea on exertion positive for - mild dependent edema with purple discoloration of feet negative for - irregular heartbeat, loss of consciousness, murmur, orthopnea, palpitations, paroxysmal nocturnal dyspnea, rapid heart rate, shortness of breath or TIA/Amaurosis Fugax, syncope/near syncope   Past Medical History  Diagnosis Date  . Hypertension   . Osteopenia   . Atrial fibrillation     Prior Cardiac Evaluation and Past Surgical History: Past Surgical History  Procedure Laterality Date  . Hernia repair    . Cardioversion  04/18/2011   Procedure: CARDIOVERSION;  Surgeon: Leonie Man;  Location: [facility] OR;  Service: Cardiovascular;  Laterality: N/A;  . Lexiscan myoview  04/14/2011    No scintigraphic evidence of inducible myocardial ischemia, EKG negative for ischemia, patient developed Atrial Fibrillation with rapid ventricular response during stress and persisted in the recovery period, abnormal myocardial perfusion study  . 2d echocardiogram  03/29/2011    EF 55-65%, normal    Allergies  Allergen Reactions  . Ciprofloxacin Other (See Comments)    Cant be given with her multaq    Current Outpatient Prescriptions  Medication Sig Dispense Refill  . aspirin 81 MG tablet Take 81 mg by mouth daily.      Marland Kitchen dronedarone (MULTAQ) 400 MG tablet Take 1 tablet (400 mg total) by mouth 2 (two) times daily with a meal. 180 tablet 3  . lisinopril (PRINIVIL,ZESTRIL) 10 MG tablet Take 1 tablet (10 mg total) by mouth daily. 90 tablet 3  . nitroGLYCERIN (NITROSTAT) 0.4 MG SL tablet Place 1 tablet (0.4 mg total) under the tongue every 5 (five) minutes as needed. For chest pain 25 tablet 6  . Rivaroxaban (XARELTO) 15 MG TABS tablet Take 1 tablet (15 mg total) by mouth daily. 90 tablet 3  . metoprolol tartrate (LOPRESSOR) 25 MG tablet Take 0.5 tablets (12.5 mg total) by mouth 2 (two) times daily. 90 tablet 3  . Rivaroxaban (XARELTO) 15 MG TABS tablet Take 1 tablet (15 mg total) by mouth 2 (two) times daily with a meal. 70 tablet 0  . [DISCONTINUED] digoxin (LANOXIN) 0.125  MG tablet Take 125 mcg by mouth daily.       No current facility-administered medications for this visit.    History   Social History Narrative   She is a widowed mother of 91, grandmother of 73, great grandmother of 50. She is usually very active and likes to walk .    recently.    She quit smoking 50 years ago. She denies any alcohol.    She says in order to try to make up for the fact she has not been doing much exercise she has been walking up and down the stairs at  the house and staying as active as possible, just doing whatever activity she can do.    Family History  Problem Relation Age of Onset  . Arthritis    . Heart failure Father     CHF  . Heart failure Mother     CHF  . Macular degeneration Mother     ROS: A comprehensive Review of Systems - Negative except Mild bruising, mild expected or osteoarthritis symptoms. Review of Systems  Constitutional:       Tires out easily  HENT: Positive for congestion.   Respiratory: Positive for cough (allergies; with recent cold - had some upper chest tightness) and wheezing (and sneezing).        Allergies -- not sure what meds are OK  Gastrointestinal: Negative for blood in stool and melena.  Genitourinary: Negative for hematuria.  Musculoskeletal: Positive for joint pain (shoulder arthritis).  Neurological: Negative for dizziness.  All other systems reviewed and are negative.    PHYSICAL EXAM BP 150/50 mmHg  Pulse 63  Ht 5' (1.524 m)  Wt 135 lb 3.2 oz (61.326 kg)  BMI 26.40 kg/m2 General appearance: alert, cooperative, appears stated age, no distress and Well-nourished and well-groomed, pleasant mood and affect. Neck: no adenopathy, no carotid bruit, no JVD and supple, symmetrical, trachea midline Lungs: clear to auscultation bilaterally, normal percussion bilaterally and Nonlabored, good air movement Heart: RRR, normal S1 and S2. Soft HSM at left lower sternal border without radiation. No other rubs or gallops. Nondisplaced PMI. Abdomen: soft, non-tender; bowel sounds normal; no masses,  no organomegaly Extremities: extremities normal, atraumatic, no cyanosis or edema Pulses: 2+ and symmetric Neurologic: Grossly normal   Adult ECG Report  Rate: 63;  Rhythm: normal sinus rhythm, non-specific ST-T wave abnormalities; otherwise normal axis and normal intervals.  Narrative Interpretation: Normal EKG  Recent Labs:  Recent Labs: 09/08/2014 [facility] Na+ 138, K+ 5.0, Cl- 97,  HCO3- 23 , BUN 22, Cr 1.2, Glu 93, Ca2+ 9.7; Phos 4.0 CBC: W 9.3, H/H 14.1/42.1, Plt 378  Lipids due in December  ASSESSMENT / PLAN:  Doing well, very stable overall from a cardiac standpoint. Well controlled A. fib with Multaq.  Will reduce BB by 1/2 due to fatigue. May need to increase ACE-I a bit if BP increases.   Problem List Items Addressed This Visit    Chronic anticoagulation (Chronic)    No bleeding complications on full anticoagulation.      Relevant Orders   EKG 12-Lead (Completed)   Diastolic dysfunction- grade 2 by echo Oct 2012 (Chronic)    No active heart failure symptoms. May need additional ACE inhibitor for blood pressure to avoid exacerbation. Not on a diuretic      Relevant Orders   EKG 12-Lead (Completed)   Essential hypertension (Chronic)    Not as well-controlled as usual today as far as blood pressure goes.  May need to increase ACE-I a bit if BP increases after dropping beta blocker dose.      Relevant Medications   dronedarone (MULTAQ) 400 MG tablet   Rivaroxaban (XARELTO) 15 MG TABS tablet   Rivaroxaban (XARELTO) 15 MG TABS tablet   metoprolol tartrate (LOPRESSOR) 25 MG tablet   Other Relevant Orders   EKG 12-Lead (Completed)   Paroxysmal atrial fibrillation - controlled with Multaq and metoprolol - Primary (Chronic)    Doing well, very stable overall from a cardiac standpoint. Well controlled A. fib with Multaq.  Will reduce BB by 1/2 due to fatigue.      Relevant Medications   dronedarone (MULTAQ) 400 MG tablet   Rivaroxaban (XARELTO) 15 MG TABS tablet   Rivaroxaban (XARELTO) 15 MG TABS tablet   metoprolol tartrate (LOPRESSOR) 25 MG tablet   Other Relevant Orders   EKG 12-Lead (Completed)      Followup: 1 yr    Hadlea Furuya, Leonie Green, M.D., M.S. Interventional Cardiologist   Pager # [phone]

## 2014-09-26 ENCOUNTER — Encounter: Payer: Self-pay | Admitting: Cardiology

## 2014-09-26 NOTE — Assessment & Plan Note (Signed)
No active heart failure symptoms. May need additional ACE inhibitor for blood pressure to avoid exacerbation. Not on a diuretic

## 2014-09-26 NOTE — Assessment & Plan Note (Signed)
Doing well, very stable overall from a cardiac standpoint. Well controlled A. fib with Multaq.  Will reduce BB by 1/2 due to fatigue.

## 2014-09-26 NOTE — Assessment & Plan Note (Signed)
No bleeding complications on full anticoagulation.

## 2014-09-26 NOTE — Assessment & Plan Note (Signed)
Not as well-controlled as usual today as far as blood pressure goes.  May need to increase ACE-I a bit if BP increases after dropping beta blocker dose.

## 2014-10-02 ENCOUNTER — Telehealth: Payer: Self-pay | Admitting: Cardiology

## 2014-10-02 NOTE — Telephone Encounter (Signed)
Please call,she needs to talk to you about the pt's blood pressure medicine.

## 2014-10-02 NOTE — Telephone Encounter (Signed)
Called patient, spoke to her concerning recent med changes. She states PCP increased dose of lisinopril from 10mg  daily to 10mg  BID.  BPs a few months ago WNR, recently running a bit higher systolic - she reports a few recent pressures, today's was 144/79 w/ HR 68 - this is typical.  Advised to take as directed, monitor for changes (dizziness, lightheadedness on standing), call PCP or call us if problems.  Pt voiced understanding, appreciation for the call.

## 2014-10-20 ENCOUNTER — Ambulatory Visit: Payer: Medicare Other | Admitting: Family Medicine

## 2014-11-01 ENCOUNTER — Inpatient Hospital Stay (HOSPITAL_COMMUNITY)
Admission: EM | Admit: 2014-11-01 | Discharge: 2014-11-04 | DRG: 378 | Disposition: A | Discharge status: Home / Self Care | Payer: Medicare Other | Attending: Family Medicine | Admitting: Family Medicine

## 2014-11-01 ENCOUNTER — Encounter (HOSPITAL_COMMUNITY): Payer: Self-pay | Admitting: Emergency Medicine

## 2014-11-01 DIAGNOSIS — L821 Other seborrheic keratosis: Secondary | ICD-10-CM | POA: Diagnosis present

## 2014-11-01 DIAGNOSIS — M899 Disorder of bone, unspecified: Secondary | ICD-10-CM | POA: Diagnosis present

## 2014-11-01 DIAGNOSIS — E871 Hypo-osmolality and hyponatremia: Secondary | ICD-10-CM | POA: Diagnosis present

## 2014-11-01 DIAGNOSIS — Z7982 Long term (current) use of aspirin: Secondary | ICD-10-CM

## 2014-11-01 DIAGNOSIS — Z881 Allergy status to other antibiotic agents status: Secondary | ICD-10-CM | POA: Diagnosis not present

## 2014-11-01 DIAGNOSIS — N183 Chronic kidney disease, stage 3 (moderate): Secondary | ICD-10-CM | POA: Diagnosis present

## 2014-11-01 DIAGNOSIS — K640 First degree hemorrhoids: Secondary | ICD-10-CM | POA: Diagnosis present

## 2014-11-01 DIAGNOSIS — Z66 Do not resuscitate: Secondary | ICD-10-CM | POA: Diagnosis present

## 2014-11-01 DIAGNOSIS — D62 Acute posthemorrhagic anemia: Secondary | ICD-10-CM | POA: Diagnosis not present

## 2014-11-01 DIAGNOSIS — K922 Gastrointestinal hemorrhage, unspecified: Secondary | ICD-10-CM | POA: Diagnosis not present

## 2014-11-01 DIAGNOSIS — I129 Hypertensive chronic kidney disease with stage 1 through stage 4 chronic kidney disease, or unspecified chronic kidney disease: Secondary | ICD-10-CM | POA: Diagnosis present

## 2014-11-01 DIAGNOSIS — M8580 Other specified disorders of bone density and structure, unspecified site: Secondary | ICD-10-CM | POA: Diagnosis present

## 2014-11-01 DIAGNOSIS — R0609 Other forms of dyspnea: Secondary | ICD-10-CM | POA: Diagnosis present

## 2014-11-01 DIAGNOSIS — I5032 Chronic diastolic (congestive) heart failure: Secondary | ICD-10-CM | POA: Diagnosis present

## 2014-11-01 DIAGNOSIS — N179 Acute kidney failure, unspecified: Secondary | ICD-10-CM | POA: Diagnosis not present

## 2014-11-01 DIAGNOSIS — I480 Paroxysmal atrial fibrillation: Secondary | ICD-10-CM | POA: Diagnosis present

## 2014-11-01 DIAGNOSIS — Z8249 Family history of ischemic heart disease and other diseases of the circulatory system: Secondary | ICD-10-CM | POA: Diagnosis not present

## 2014-11-01 DIAGNOSIS — I472 Ventricular tachycardia: Secondary | ICD-10-CM | POA: Diagnosis present

## 2014-11-01 DIAGNOSIS — Z79899 Other long term (current) drug therapy: Secondary | ICD-10-CM | POA: Diagnosis not present

## 2014-11-01 DIAGNOSIS — M949 Disorder of cartilage, unspecified: Secondary | ICD-10-CM

## 2014-11-01 DIAGNOSIS — K921 Melena: Secondary | ICD-10-CM | POA: Diagnosis present

## 2014-11-01 DIAGNOSIS — Z7901 Long term (current) use of anticoagulants: Secondary | ICD-10-CM

## 2014-11-01 DIAGNOSIS — I4819 Other persistent atrial fibrillation: Secondary | ICD-10-CM | POA: Diagnosis present

## 2014-11-01 DIAGNOSIS — E875 Hyperkalemia: Secondary | ICD-10-CM | POA: Diagnosis not present

## 2014-11-01 DIAGNOSIS — I519 Heart disease, unspecified: Secondary | ICD-10-CM | POA: Diagnosis not present

## 2014-11-01 DIAGNOSIS — K625 Hemorrhage of anus and rectum: Secondary | ICD-10-CM | POA: Diagnosis present

## 2014-11-01 DIAGNOSIS — I10 Essential (primary) hypertension: Secondary | ICD-10-CM | POA: Diagnosis present

## 2014-11-01 DIAGNOSIS — I5189 Other ill-defined heart diseases: Secondary | ICD-10-CM | POA: Diagnosis present

## 2014-11-01 DIAGNOSIS — K5731 Diverticulosis of large intestine without perforation or abscess with bleeding: Secondary | ICD-10-CM | POA: Diagnosis present

## 2014-11-01 HISTORY — DX: Other seborrheic keratosis: L82.1

## 2014-11-01 LAB — COMPREHENSIVE METABOLIC PANEL
ALT: 11 U/L — AB (ref 14–54)
ANION GAP: 10 (ref 5–15)
AST: 19 U/L (ref 15–41)
Albumin: 3.6 g/dL (ref 3.5–5.0)
Alkaline Phosphatase: 63 U/L (ref 38–126)
BILIRUBIN TOTAL: 0.8 mg/dL (ref 0.3–1.2)
BUN: 19 mg/dL (ref 6–20)
CALCIUM: 9.5 mg/dL (ref 8.9–10.3)
CHLORIDE: 100 mmol/L — AB (ref 101–111)
CO2: 23 mmol/L (ref 22–32)
Creatinine, Ser: 1.39 mg/dL — ABNORMAL HIGH (ref 0.44–1.00)
GFR calc Af Amer: 39 mL/min — ABNORMAL LOW (ref >60)
GFR, EST NON AFRICAN AMERICAN: 34 mL/min — AB (ref >60)
Glucose, Bld: 115 mg/dL — ABNORMAL HIGH (ref 65–99)
Potassium: 5.5 mmol/L — ABNORMAL HIGH (ref 3.5–5.1)
Sodium: 133 mmol/L — ABNORMAL LOW (ref 135–145)
TOTAL PROTEIN: 6.9 g/dL (ref 6.5–8.1)

## 2014-11-01 LAB — CBC
HCT: 38.6 % (ref 36.0–46.0)
HEMATOCRIT: 39.7 % (ref 36.0–46.0)
HEMOGLOBIN: 13.3 g/dL (ref 12.0–15.0)
Hemoglobin: 13.6 g/dL (ref 12.0–15.0)
MCH: 31.5 pg (ref 26.0–34.0)
MCH: 31.1 pg (ref 26.0–34.0)
MCHC: 34.5 g/dL (ref 30.0–36.0)
MCHC: 34.3 g/dL (ref 30.0–36.0)
MCV: 91.5 fL (ref 78.0–100.0)
MCV: 90.6 fL (ref 78.0–100.0)
PLATELETS: 327 10*3/uL (ref 150–400)
Platelets: 357 10*3/uL (ref 150–400)
RBC: 4.22 MIL/uL (ref 3.87–5.11)
RBC: 4.38 MIL/uL (ref 3.87–5.11)
RDW: 13.4 % (ref 11.5–15.5)
RDW: 13.3 % (ref 11.5–15.5)
WBC: 9 10*3/uL (ref 4.0–10.5)
WBC: 9.9 10*3/uL (ref 4.0–10.5)

## 2014-11-01 LAB — ABO/RH: ABO/RH(D): O NEG

## 2014-11-01 LAB — TYPE AND SCREEN
ABO/RH(D): O NEG
Antibody Screen: NEGATIVE

## 2014-11-01 LAB — DIGOXIN LEVEL

## 2014-11-01 LAB — POC OCCULT BLOOD, ED: FECAL OCCULT BLD: POSITIVE — AB

## 2014-11-01 LAB — PROTIME-INR
INR: 2.38 — AB (ref 0.00–1.49)
Prothrombin Time: 25.7 seconds — ABNORMAL HIGH (ref 11.6–15.2)

## 2014-11-01 LAB — MAGNESIUM: Magnesium: 1.9 mg/dL (ref 1.7–2.4)

## 2014-11-01 MED ORDER — SODIUM CHLORIDE 0.9 % IV SOLN
INTRAVENOUS | Status: AC
  Administered 2014-11-01: 09:00:00 via INTRAVENOUS

## 2014-11-01 MED ORDER — ENSURE ENLIVE PO LIQD
237.0000 mL | Freq: Two times a day (BID) | ORAL | Status: DC
  Administered 2014-11-01 – 2014-11-04 (×7): 237 mL via ORAL

## 2014-11-01 MED ORDER — SODIUM CHLORIDE 0.9 % IV SOLN: 250.0000 mL | INTRAVENOUS | Status: DC | PRN

## 2014-11-01 MED ORDER — SODIUM CHLORIDE 0.9 % IJ SOLN: 3.0000 mL | Freq: Two times a day (BID) | INTRAMUSCULAR | Status: DC

## 2014-11-01 MED ORDER — FUROSEMIDE 10 MG/ML IJ SOLN
40.0000 mg | Freq: Once | INTRAMUSCULAR | Status: AC
Start: 2014-11-01 — End: 2014-11-01
  Administered 2014-11-01: 40 mg via INTRAVENOUS
  Filled 2014-11-01: qty 4

## 2014-11-01 MED ORDER — METOPROLOL TARTRATE 12.5 MG HALF TABLET
12.5000 mg | ORAL_TABLET | Freq: Two times a day (BID) | ORAL | Status: DC
Start: 2014-11-01 — End: 2014-11-04
  Administered 2014-11-01 – 2014-11-04 (×7): 12.5 mg via ORAL
  Filled 2014-11-01 (×8): qty 1

## 2014-11-01 MED ORDER — SODIUM CHLORIDE 0.9 % IV SOLN
INTRAVENOUS | Status: DC
  Administered 2014-11-01: 22:00:00 via INTRAVENOUS

## 2014-11-01 MED ORDER — SODIUM CHLORIDE 0.9 % IJ SOLN: 3.0000 mL | INTRAMUSCULAR | Status: DC | PRN

## 2014-11-01 MED ORDER — POLYETHYLENE GLYCOL 3350 17 G PO PACK
17.0000 g | PACK | Freq: Two times a day (BID) | ORAL | Status: DC
  Administered 2014-11-01 – 2014-11-04 (×5): 17 g via ORAL
  Filled 2014-11-01 (×8): qty 1

## 2014-11-01 MED ORDER — SODIUM POLYSTYRENE SULFONATE 15 GM/60ML PO SUSP
15.0000 g | Freq: Once | ORAL | Status: DC
  Filled 2014-11-01: qty 60

## 2014-11-01 MED ORDER — DRONEDARONE HCL 400 MG PO TABS
400.0000 mg | ORAL_TABLET | Freq: Two times a day (BID) | ORAL | Status: DC
  Administered 2014-11-01 – 2014-11-04 (×7): 400 mg via ORAL
  Filled 2014-11-01 (×10): qty 1

## 2014-11-01 MED ORDER — NITROGLYCERIN 0.4 MG SL SUBL: 0.4000 mg | SUBLINGUAL_TABLET | SUBLINGUAL | Status: DC | PRN

## 2014-11-01 MED ORDER — SODIUM CHLORIDE 0.9 % IJ SOLN
3.0000 mL | Freq: Two times a day (BID) | INTRAMUSCULAR | Status: DC
  Administered 2014-11-02: 3 mL via INTRAVENOUS
  Administered 2014-11-03: 10 mL via INTRAVENOUS

## 2014-11-01 MED ORDER — VITAMIN D (ERGOCALCIFEROL) 1.25 MG (50000 UNIT) PO CAPS
50000.0000 [IU] | ORAL_CAPSULE | ORAL | Status: DC
  Administered 2014-11-04: 50000 [IU] via ORAL
  Filled 2014-11-01: qty 1

## 2014-11-01 NOTE — ED Provider Notes (Signed)
CSN: [id number]     Arrival date & time 11/01/14  0426 History   First MD Initiated Contact with Patient 11/01/14 [id number]     Chief Complaint  Patient presents with  . Rectal Bleeding    Patient is a 81 y.o. female presenting with hematochezia. The history is provided by the patient and a relative.  Rectal Bleeding Quality:  Bright red Amount:  Moderate Duration:  2 hours Timing:  Intermittent Progression:  Unchanged Chronicity:  New Relieved by:  None tried Worsened by:  Nothing tried Associated symptoms: light-headedness   Associated symptoms: no fever, no hematemesis and no vomiting   Associated symptoms comment:  Abdominal cramping  Risk factors: anticoagulant use   pt reports waking up tonight and had up to 4 bloody bowel movements No vomiting She has never had this before She currently takes xarelto She has no other complaints at this time   Past Medical History  Diagnosis Date  . Hypertension   . Osteopenia   . Atrial fibrillation    Past Surgical History  Procedure Laterality Date  . Hernia repair    . Cardioversion  04/18/2011    Procedure: CARDIOVERSION;  Surgeon: Leonie Man;  Location: [facility] OR;  Service: Cardiovascular;  Laterality: N/A;  . Lexiscan myoview  04/14/2011    No scintigraphic evidence of inducible myocardial ischemia, EKG negative for ischemia, patient developed Atrial Fibrillation with rapid ventricular response during stress and persisted in the recovery period, abnormal myocardial perfusion study  . 2d echocardiogram  03/29/2011    EF 55-65%, normal   Family History  Problem Relation Age of Onset  . Arthritis    . Heart failure Father     CHF  . Heart failure Mother     CHF  . Macular degeneration Mother    History  Substance Use Topics  . Smoking status: Never Smoker   . Smokeless tobacco: Never Used  . Alcohol Use: No   OB History    No data available     Review of Systems  Constitutional: Negative for fever.   Gastrointestinal: Positive for blood in stool and hematochezia. Negative for vomiting and hematemesis.  Neurological: Positive for light-headedness.  All other systems reviewed and are negative.     Allergies  Ciprofloxacin  Home Medications   Prior to Admission medications   Medication Sig Start Date End Date Taking? Authorizing Provider  aspirin 81 MG tablet Take 81 mg by mouth daily.     Yes Historical Provider, MD  dronedarone (MULTAQ) 400 MG tablet Take 1 tablet (400 mg total) by mouth 2 (two) times daily with a meal. 09/24/14  Yes Leonie Man, MD  lisinopril (PRINIVIL,ZESTRIL) 10 MG tablet Take 1 tablet (10 mg total) by mouth daily. 04/21/14  Yes Alferd Apa Lowne, DO  metoprolol tartrate (LOPRESSOR) 25 MG tablet Take 0.5 tablets (12.5 mg total) by mouth 2 (two) times daily. 09/24/14  Yes Leonie Man, MD  nitroGLYCERIN (NITROSTAT) 0.4 MG SL tablet Place 1 tablet (0.4 mg total) under the tongue every 5 (five) minutes as needed. For chest pain 09/19/13  Yes Leonie Man, MD  Rivaroxaban (XARELTO) 15 MG TABS tablet Take 1 tablet (15 mg total) by mouth daily. 09/24/14  Yes Leonie Man, MD  Vitamin D, Ergocalciferol, (DRISDOL) 50000 UNITS CAPS capsule Take 50,000 Units by mouth every 7 (seven) days. Wednesday   Yes Historical Provider, MD  Rivaroxaban (XARELTO) 15 MG TABS tablet Take 1 tablet (15 mg total) by  mouth 2 (two) times daily with a meal. Patient not taking: Reported on 11/01/2014 09/24/14   Leonie Man, MD   BP 169/70 mmHg  Pulse 76  Temp(Src) 98.1 F (36.7 C) (Oral)  Resp 14  Ht 5\' 1"  (1.549 m)  Wt 138 lb (62.596 kg)  BMI 26.09 kg/m2  SpO2 97% Physical Exam CONSTITUTIONAL: Well developed/well nourished HEAD: Normocephalic/atraumatic EYES: EOMI/PERRL ENMT: Mucous membranes moist NECK: supple no meningeal signs SPINE/BACK:entire spine nontender CV: irregular no loud murmurs LUNGS: Lungs are clear to auscultation bilaterally, no apparent distress ABDOMEN:  soft, nontender, no rebound or guarding, bowel sounds noted throughout abdomen GU:no cva tenderness NEURO: Pt is awake/alert/appropriate, moves all extremitiesx4.  No facial droop.   EXTREMITIES: pulses normal/equal, full ROM SKIN: warm, color normal PSYCH: no abnormalities of mood noted, alert and oriented to situation  ED Course  Procedures    EKG Interpretation  Date/Time:  Sunday November 01 2014 06:20:13 EDT Ventricular Rate:  65 PR Interval:  159 QRS Duration: 88 QT Interval:  412 QTC Calculation: 428 R Axis:   76 Text Interpretation:  Sinus rhythm Minimal ST depression, diffuse leads No significant change since last tracing Confirmed by Christy Gentles  MD, Floye Fesler ([id number]) on 11/01/2014 6:28:32 AM       Labs Review Labs Reviewed  COMPREHENSIVE METABOLIC PANEL - Abnormal; Notable for the following:    Sodium 133 (*)    Potassium 5.5 (*)    Chloride 100 (*)    Glucose, Bld 115 (*)    Creatinine, Ser 1.39 (*)    ALT 11 (*)    GFR calc non Af Amer 34 (*)    GFR calc Af Amer 39 (*)    All other components within normal limits  DIGOXIN LEVEL - Abnormal; Notable for the following:    Digoxin Level <0.2 (*)    All other components within normal limits  PROTIME-INR - Abnormal; Notable for the following:    Prothrombin Time 25.7 (*)    INR 2.38 (*)    All other components within normal limits  POC OCCULT BLOOD, ED - Abnormal; Notable for the following:    Fecal Occult Bld POSITIVE (*)    All other components within normal limits  CBC  TYPE AND SCREEN  ABO/RH     5:39 AM Nurse performed rectal and reports grossly bloody stool Pt is on xarelto HGB currently stable Pt has no religious/personal objections to receiving blood products if necessary  6:28 AM Pt stable When I walked in room she appeared to have brief run of nonsustained V-tach She had no symptoms Now currently in NSR on EKG Rhythm strip revealed wide complex tachycardia, resolved She is on multaq Will consult  cardiology 7:15 AM D/w dr Rayann Heman, cardiology, we discussed run of NSVT, recommends continued monitoring and if any other issues they can be called back I spoke to [place] with [facility] will admit to [facility] for rectal bleeding  MDM   Final diagnoses:  Rectal bleeding  Hyperkalemia    Nursing notes including past medical history and social history reviewed and considered in documentation Labs/vital reviewed myself and considered during evaluation     Ripley Fraise, MD 11/01/14 [phone]

## 2014-11-01 NOTE — Progress Notes (Signed)
[facility] Consult: 11:14 AM 11/01/2014  LOS: 0 days    Referring Provider: Dr Haze Boyden  Primary Care Physician:  Garnet Koyanagi, DO Primary Gastroenterologist:  None.     Reason for Consultation:  Painless hematochezia.    HPI: Allison Sellers is a 81 y.o. female.  On Xarelto, 81 ASA for a fib.  Previous cardioversion failed within 24 hours.  3 AM today awakened to have BM and passed large volume, pure blood.  Had 3 episodes at home, her 4th and last BM ~ 6 AM in the ED.  No abdominal pain, no n/v.  No previous BPR.  Not dizzy, no palpitations or chest pain.   No NSAIDs except the ASA.  Daily, brown, BMs.  Colonoscopy by PMD at time, Dr Mancel Bale, was ~ 20 yrs ago.  Pt not aware of diagnosis of diverticulosis or polyps.      Past Medical History  Diagnosis Date  . Hypertension   . Osteopenia   . Atrial fibrillation   . Seborrheic keratosis 06/2014    of hand.     Past Surgical History  Procedure Laterality Date  . Umbilical hernia repair    . Cardioversion  04/18/2011    Procedure: CARDIOVERSION;  Surgeon: Leonie Man;  Location: [facility] OR;  Service: Cardiovascular;  Laterality: N/A;  . Lexiscan myoview  04/14/2011    No scintigraphic evidence of inducible myocardial ischemia, EKG negative for ischemia, patient developed Atrial Fibrillation with rapid ventricular response during stress and persisted in the recovery period, abnormal myocardial perfusion study  . 2d echocardiogram  03/29/2011    EF 55-65%, normal    Prior to Admission medications   Medication Sig Start Date End Date Taking? Authorizing Provider  aspirin 81 MG tablet Take 81 mg by mouth daily.     Yes Historical Provider, MD  dronedarone (MULTAQ) 400 MG tablet Take 1 tablet (400 mg total) by mouth 2 (two) times daily with a meal. 09/24/14   Yes Leonie Man, MD  lisinopril (PRINIVIL,ZESTRIL) 10 MG tablet Take 1 tablet (10 mg total) by mouth daily. 04/21/14  Yes Alferd Apa Lowne, DO  metoprolol tartrate (LOPRESSOR) 25 MG tablet Take 0.5 tablets (12.5 mg total) by mouth 2 (two) times daily. 09/24/14  Yes Leonie Man, MD  nitroGLYCERIN (NITROSTAT) 0.4 MG SL tablet Place 1 tablet (0.4 mg total) under the tongue every 5 (five) minutes as needed. For chest pain 09/19/13  Yes Leonie Man, MD  Rivaroxaban (XARELTO) 15 MG TABS tablet Take 1 tablet (15 mg total) by mouth daily. 09/24/14  Yes Leonie Man, MD  Vitamin D, Ergocalciferol, (DRISDOL) 50000 UNITS CAPS capsule Take 50,000 Units by mouth every 7 (seven) days. Wednesday   Yes Historical Provider, MD  Rivaroxaban (XARELTO) 15 MG TABS tablet Take 1 tablet (15 mg total) by mouth 2 (two) times daily with a meal. Patient not taking: Reported on 11/01/2014 09/24/14   Leonie Man, MD    Scheduled Meds: . dronedarone  400 mg Oral BID WC  . feeding supplement (ENSURE  ENLIVE)  237 mL Oral BID BM  . metoprolol tartrate  12.5 mg Oral BID  . polyethylene glycol  17 g Oral BID  . sodium chloride  3 mL Intravenous Q12H  . sodium chloride  3 mL Intravenous Q12H  . sodium polystyrene  15 g Oral Once  . [START ON 11/04/2014] Vitamin D (Ergocalciferol)  50,000 Units Oral Q Wed   Infusions: . sodium chloride 75 mL/hr at 11/01/14 0924   PRN Meds: sodium chloride, nitroGLYCERIN, sodium chloride   Allergies as of 11/01/2014 - Review Complete 11/01/2014  Allergen Reaction Noted  . Ciprofloxacin Other (See Comments) 11/13/2013    Family History  Problem Relation Age of Onset  . Arthritis    . Heart failure Father     CHF  . Heart failure Mother     CHF  . Macular degeneration Mother     History   Social History  . Marital Status: Widowed    Spouse Name: N/A  . Number of Children: N/A  . Years of Education: N/A   Occupational History  . Not on file.   Social History  Main Topics  . Smoking status: Never Smoker   . Smokeless tobacco: Never Used  . Alcohol Use: No  . Drug Use: No  . Sexual Activity: Not Currently   Other Topics Concern  . Not on file   Social History Narrative   She is a widowed mother of 21, grandmother of 74, great grandmother of 1. She is usually very active and likes to walk .    recently.    She quit smoking 50 years ago. She denies any alcohol.    She says in order to try to make up for the fact she has not been doing much exercise she has been walking up and down the stairs at the house and staying as active as possible, just doing whatever activity she can do.     REVIEW OF SYSTEMS: Constitutional:  5# weight drop due to reducing sugar intake.  Mild fatigue in the last week but still able to do her housework.  Walking on treadmill until last week ENT:  No nose bleeds.  + bil hearing aids.  Pulm:  No cough, vague SOB CV:  No palpitations, no LE edema. No chest pain GU:  No hematuria, no frequency GI:  Per hpi.  No dysphagia.   Heme:  No issues with excessive bleeding or bruising   Transfusions:  None ever Neuro:  No headaches, no peripheral tingling or numbness.  Wears bifocals.  No blurry vision. Derm:  No itching, no rash or sores.  Endocrine:  No sweats or chills.  No polyuria or dysuria.  Stopped eating sugar earlier this year at MD suggestion, had glucose intolerance.  Immunization:  Reviewed, several up to date.     PHYSICAL EXAM: Vital signs in last 24 hours: Filed Vitals:   11/01/14 0819  BP: 136/64  Pulse: 74  Temp: 98.1 F (36.7 C)  Resp: 20   Wt Readings from Last 3 Encounters:  11/01/14 133 lb 6.4 oz (60.51 kg)  09/24/14 135 lb 3.2 oz (61.326 kg)  04/21/14 144 lb (65.318 kg)   General: pleasant, looks well and younger than 87 Head:  No swelling of face or asymmetry.  No signs of trauma  Eyes:  No icterus, conjunctiva is pink.  Ears:  Hearing normal with bil hearing aids in place.   Nose: no  discharge or congestion Mouth:  Clear, moist, tongue  midline.  Good dentition Neck:  No mass, JVD, TMG, adenopathy Lungs:  Dry crackles in bases.  Excellent BS.  No cough or dyspnea Heart: RRR.  No mrg.  s1 and s2 audible Abdomen:  Soft, NT. Active BS.  No masses, no hsm   Rectal: deferred   Musc/Skeltl: bi joint swelling , redness, contractures Extremities:  No CCE.  Feet warm, wll perfused  Neurologic:  Oriented x 3.  Limb strengthe full and equal.  No tremor.  No gross deficits Skin:  No rash or sores.  No telangectasi, no purpura Tattoos:  none Nodes:  No cervical or inguinal adenopathy   Psych:  Pleasant, cooperaritive, in good spirits.   Intake/Output from previous day:   Intake/Output this shift:    LAB RESULTS:  Recent Labs  11/01/14 0448 11/01/14 0910  WBC 9.0 9.9  HGB 13.3 13.6  HCT 38.6 39.7  PLT 327 357   BMET Lab Results  Component Value Date   NA 133* 11/01/2014   NA 130* 05/19/2014   NA 133* 05/12/2014   K 5.5* 11/01/2014   K 4.3 05/19/2014   K 4.5 05/12/2014   CL 100* 11/01/2014   CL 99 05/19/2014   CL 103 05/12/2014   CO2 23 11/01/2014   CO2 23 05/19/2014   CO2 22 05/12/2014   GLUCOSE 115* 11/01/2014   GLUCOSE 118* 05/19/2014   GLUCOSE 134* 05/12/2014   BUN 19 11/01/2014   BUN 21 05/19/2014   BUN 23 05/12/2014   CREATININE 1.39* 11/01/2014   CREATININE 1.2 05/19/2014   CREATININE 1.4* 05/12/2014   CALCIUM 9.5 11/01/2014   CALCIUM 9.0 05/19/2014   CALCIUM 9.2 05/12/2014   LFT  Recent Labs  11/01/14 0448  PROT 6.9  ALBUMIN 3.6  AST 19  ALT 11*  ALKPHOS 63  BILITOT 0.8   PT/INR Lab Results  Component Value Date   INR 2.38* 11/01/2014   INR 1.04 03/27/2011   INR 1.0 03/27/2011    RADIOLOGY STUDIES: No results found.  ENDOSCOPIC STUDIES: Remote colonoscopy per Dr Orene Desanctis, op note not in [place].   IMPRESSION:   *  Painless hematochezia.  Rule out diverticular bleed, r/o neoplasia, r/o AVMs. Hgb stable c/w 8  months ago.   *  Afib, chronic Xarelto.  On hold, last dose was 6/4 PM.    PLAN:     *  Colonoscopy this week, timing to be detrmined but probably Tuesday, at which point will have been 2 full days off Xarelto.  *  CBC in AM instead of q 6 hours.  *  Will allow solids until tomorrow, currently on full liquids.  Start clears in AM.     Azucena Freed  11/01/2014, 11:14 AM Pager: [phone] Attending MD note:   I have taken a history, examined the patient, and reviewed the chart. I agree with the Advanced Practitioner's impression and recommendations.  Small bloody stool earlier today. Painless rectal bleeding consistent wiht a diverticular bleed.Hemodynamically stable.Plan colonoscopy when Xarelto off x 48 hours. [facility] Pager # [phone]

## 2014-11-01 NOTE — ED Notes (Signed)
Patient here with complaint of bright red bloody bowel movements starting around 0300. States some clots noted. Patient is on Xarelto. VS adequate in triage, appears well, color normal.

## 2014-11-01 NOTE — H&P (Signed)
[facility] History and Physical                                                                                    Allison Sellers, is a 81 y.o. female  MRN: [id number]   DOB - [birth year]  Admit Date - 11/01/2014  Outpatient Primary MD for the patient is Garnet Koyanagi, DO  Referring Physician:  Dr. Christy Gentles  Chief Complaint:   Chief Complaint  Patient presents with  . Rectal Bleeding     HPI  Allison Sellers  is a 81 y.o. female, with a past medical history of hypertension, atrial fibrillation, and osteopenia. She takes Xarelto for A. fib. She presents to the emergency department with rectal bleeding that started at 3 AM this morning. Allison Sellers reports that she has never had rectal bleeding before. She has had no pain with this episode. She awoke at 3 AM thinking she had to go to the bathroom but had bright red blood per rectum. She has had 3 more episodes since. She denies any dizziness, chest pain, palpitations. She is not having any abdominal pain or cramping but does report some mild distention of her abdomen over the past few days. Her bowel movements have been regular prior to this morning. She typically has one solid bowel movement in the morning each day. She does take a daily aspirin in addition to Xarelto. She has had no difficulties with bleeding or extensive hematomas. She had a colonoscopy done 15-20 years ago by her primary care physician. To her recollection there were no polyps or abnormalities found. She does not have a history of colon cancer or rectal bleeding in her family.  In the emergency department the patient had a short episode of nonsustained V. tach on telemetry. The emergency department physician discussed this with Dr. Rayann Heman who requested to be consulted if these episodes continue.  Her hemoglobin is stable at 13.3.  Other labs appear stable with the exception of her potassium which is minimally elevated at 5.5. Her creatinine is currently at baseline (1.39).  Review  of Systems   In addition to the HPI above,  She complains of a dry cough that she has had for the last couple of months. She denies any sputum production or shortness of breath. No Fever-chills, No Headache, No changes with Vision or hearing, No problems swallowing food or Liquids, No Chest pain, Cough or Shortness of Breath, No Abdominal pain, No Nausea or Vomiting, Bowel movements are regular, No dysuria, No new skin rashes or bruises, No new joints pains-aches,  No new weakness, tingling, numbness in any extremity, No recent weight gain or loss, A full 10 point Review of Systems was done, except as stated above, all other Review of Systems were negative.  Past Medical History  Past Medical History  Diagnosis Date  . Hypertension   . Osteopenia   . Atrial fibrillation     Past Surgical History  Procedure Laterality Date  . Hernia repair    . Cardioversion  04/18/2011    Procedure: CARDIOVERSION;  Surgeon: Leonie Man;  Location: [place];  Service: Cardiovascular;  Laterality: N/A;  . Leane Call  04/14/2011    No scintigraphic evidence of inducible myocardial ischemia, EKG negative for ischemia, patient developed Atrial Fibrillation with rapid ventricular response during stress and persisted in the recovery period, abnormal myocardial perfusion study  . 2d echocardiogram  03/29/2011    EF 55-65%, normal      Social History History  Substance Use Topics  . Smoking status: Never Smoker   . Smokeless tobacco: Never Used  . Alcohol Use: No   she lives at home alone. Is independent with ADLs. She has 2 daughters who staying close contact with her.  Family History Family History  Problem Relation Age of Onset  . Arthritis    . Heart failure Father     CHF  . Heart failure Mother     CHF  . Macular degeneration Mother    no known colon cancer or colon polyps.  Prior to Admission medications   Medication Sig Start Date End Date Taking? Authorizing Provider   aspirin 81 MG tablet Take 81 mg by mouth daily.     Yes Historical Provider, MD  dronedarone (MULTAQ) 400 MG tablet Take 1 tablet (400 mg total) by mouth 2 (two) times daily with a meal. 09/24/14  Yes Leonie Man, MD  lisinopril (PRINIVIL,ZESTRIL) 10 MG tablet Take 1 tablet (10 mg total) by mouth daily. 04/21/14  Yes Alferd Apa Lowne, DO  metoprolol tartrate (LOPRESSOR) 25 MG tablet Take 0.5 tablets (12.5 mg total) by mouth 2 (two) times daily. 09/24/14  Yes Leonie Man, MD  nitroGLYCERIN (NITROSTAT) 0.4 MG SL tablet Place 1 tablet (0.4 mg total) under the tongue every 5 (five) minutes as needed. For chest pain 09/19/13  Yes Leonie Man, MD  Rivaroxaban (XARELTO) 15 MG TABS tablet Take 1 tablet (15 mg total) by mouth daily. 09/24/14  Yes Leonie Man, MD  Vitamin D, Ergocalciferol, (DRISDOL) 50000 UNITS CAPS capsule Take 50,000 Units by mouth every 7 (seven) days. Wednesday   Yes Historical Provider, MD  Rivaroxaban (XARELTO) 15 MG TABS tablet Take 1 tablet (15 mg total) by mouth 2 (two) times daily with a meal. Patient not taking: Reported on 11/01/2014 09/24/14   Leonie Man, MD    Allergies  Allergen Reactions  . Ciprofloxacin Other (See Comments)    Cant be given with her multaq    Physical Exam  Vitals  Blood pressure 161/63, pulse 70, temperature 98.5 F (36.9 C), temperature source Oral, resp. rate 22, height 5\' 1"  (1.549 m), weight 62.596 kg (138 lb), SpO2 97 %.   General: Well-developed, well-preserved, 81 year old female lying in bed in NAD, daughters at bedside   Psych:  Normal affect and insight, Not Suicidal or Homicidal, Awake Alert, Oriented X 3.  Neuro:   No F.N deficits, ALL C.Nerves Intact, Strength 5/5 all 4 extremities, Sensation intact all 4 extremities.  ENT:  Ears and Eyes appear Normal, Conjunctivae clear, PER. Moist oral mucosa without erythema or exudates.  Neck:  Supple, No lymphadenopathy appreciated  Respiratory:  Symmetrical chest wall  movement, Good air movement bilaterally, CTAB.  Cardiac:  RRR, No Murmurs, no LE edema noted, no JVD.    Abdomen: Mildly distended, nontender, positive bowel sounds, no masses   Skin:  No Cyanosis, Normal Skin Turgor, No Skin Rash or Bruise.  Extremities:  Able to move all 4. 5/5 strength in each,  no effusions.  Data Review  CBC  Recent Labs Lab 11/01/14 0448  WBC 9.0  HGB 13.3  HCT 38.6  PLT  327  MCV 91.5  MCH 31.5  MCHC 34.5  RDW 13.4    Chemistries   Recent Labs Lab 11/01/14 0448  NA 133*  K 5.5*  CL 100*  CO2 23  GLUCOSE 115*  BUN 19  CREATININE 1.39*  CALCIUM 9.5  AST 19  ALT 11*  ALKPHOS 63  BILITOT 0.8     Coagulation profile  Recent Labs Lab 11/01/14 0448  INR 2.38*     Imaging results:   My personal review of EKG: Sinus rhythm, no peaked T waves, normal QT.   Assessment & Plan  Principal Problem:   Rectal bleeding Active Problems:   Essential hypertension   Disorder of bone and cartilage   Paroxysmal atrial fibrillation - controlled with Multaq and metoprolol   Chronic anticoagulation   Diastolic dysfunction- grade 2 by echo Oct 2012   Hyperkalemia   Chronic kidney disease  GI bleed on chronic anticoagulation Appears to be lower. Likely diverticular. Patient is currently hemodynamically stable. Will admit to telemetry Serial CBCs. GI consultation. Hold Xarelto and aspirin. If she continues to bleed she may benefit from a colonoscopy. Will place on clear liquid diet. GlycoLax twice a day. Will need Cardiology / GI opinion regarding whether or not she needs both Aspirin and Xeralto, and when the [place] can be restarted.  Atrial fibrillation Well controlled with dronedarone, metoprolol, and Xarelto  Short episode of nonsustained vtach on telemetry in the ER.  The EDP discussed this with Dr. Rayann Heman.  Given that the patient is asymptomatic, Cardiology requested that we call them back if these episodes continue, but he did not  feel she needed a consultation at this point.  HTN On lisinopril, metoprolol at home.  Will hold lisinopril at this point as her potassium is slightly high and her BP is currently normal. Will continue metoprolol  CKD stage 3 Not previously documented, creatinine is stable.  GFR 34%.  Holding lisinopril temporarily.  Hyperkalemia Mild (5.5).  Considered Kayexalate but decided against.  On Miralax bid for 3 doses to rinse the bowel a bit.  Am bmet ordered.  Diastolic dysfunction Currently euvolemic.  Received IV lasix in ER.  Will give gentle IVF for 12 hours. Type 2 diastolic dysfunction by 2D echo with EF 55-60% - Per Dr. Allison Quarry d/c summary of 03/29/2011  Consultants Called:  [facility]  Family Communication:   2 Daughters at bedside.  Code Status:  DNR  Condition:  Guarded but stable.  Potential Disposition: to home when she can eat solid food without bleeding.  Time spent in minutes : [place],  PA-C on 11/01/2014 at 8:00 AM Between 7am to 7pm - Pager - [phone] After 7pm go to [email or web address] - password [email or web address] And look for the night coverage person covering me after hours  [facility]

## 2014-11-02 DIAGNOSIS — E875 Hyperkalemia: Secondary | ICD-10-CM

## 2014-11-02 DIAGNOSIS — Z7901 Long term (current) use of anticoagulants: Secondary | ICD-10-CM

## 2014-11-02 LAB — BASIC METABOLIC PANEL
ANION GAP: 7 (ref 5–15)
Anion gap: 7 (ref 5–15)
BUN: 25 mg/dL — ABNORMAL HIGH (ref 6–20)
BUN: 19 mg/dL (ref 6–20)
CALCIUM: 8.7 mg/dL — AB (ref 8.9–10.3)
CALCIUM: 8.4 mg/dL — AB (ref 8.9–10.3)
CO2: 26 mmol/L (ref 22–32)
CO2: 25 mmol/L (ref 22–32)
CREATININE: 1.09 mg/dL — AB (ref 0.44–1.00)
Chloride: 99 mmol/L — ABNORMAL LOW (ref 101–111)
Chloride: 98 mmol/L — ABNORMAL LOW (ref 101–111)
Creatinine, Ser: 1.29 mg/dL — ABNORMAL HIGH (ref 0.44–1.00)
GFR calc Af Amer: 52 mL/min — ABNORMAL LOW (ref >60)
GFR calc non Af Amer: 37 mL/min — ABNORMAL LOW (ref >60)
GFR, EST AFRICAN AMERICAN: 43 mL/min — AB (ref >60)
GFR, EST NON AFRICAN AMERICAN: 45 mL/min — AB (ref >60)
Glucose, Bld: 104 mg/dL — ABNORMAL HIGH (ref 65–99)
Glucose, Bld: 101 mg/dL — ABNORMAL HIGH (ref 65–99)
POTASSIUM: 5.2 mmol/L — AB (ref 3.5–5.1)
POTASSIUM: 4.8 mmol/L (ref 3.5–5.1)
Sodium: 132 mmol/L — ABNORMAL LOW (ref 135–145)
Sodium: 130 mmol/L — ABNORMAL LOW (ref 135–145)

## 2014-11-02 LAB — CBC
HCT: 33.7 % — ABNORMAL LOW (ref 36.0–46.0)
Hemoglobin: 11.5 g/dL — ABNORMAL LOW (ref 12.0–15.0)
MCH: 31.4 pg (ref 26.0–34.0)
MCHC: 34.1 g/dL (ref 30.0–36.0)
MCV: 92.1 fL (ref 78.0–100.0)
PLATELETS: 286 10*3/uL (ref 150–400)
RBC: 3.66 MIL/uL — AB (ref 3.87–5.11)
RDW: 13.5 % (ref 11.5–15.5)
WBC: 8.6 10*3/uL (ref 4.0–10.5)

## 2014-11-02 MED ORDER — PEG-KCL-NACL-NASULF-NA ASC-C 100 G PO SOLR
0.5000 | Freq: Once | ORAL | Status: AC
  Administered 2014-11-02: 100 g via ORAL
  Filled 2014-11-02: qty 1

## 2014-11-02 MED ORDER — SODIUM POLYSTYRENE SULFONATE 15 GM/60ML PO SUSP
15.0000 g | Freq: Once | ORAL | Status: AC
  Administered 2014-11-02: 15 g via ORAL
  Filled 2014-11-02: qty 60

## 2014-11-02 MED ORDER — SODIUM CHLORIDE 0.9 % IV SOLN
INTRAVENOUS | Status: DC
Start: 2014-11-02 — End: 2014-11-04

## 2014-11-02 MED ORDER — PEG-KCL-NACL-NASULF-NA ASC-C 100 G PO SOLR: 1.0000 | Freq: Once | ORAL | Status: DC

## 2014-11-02 MED ORDER — PEG-KCL-NACL-NASULF-NA ASC-C 100 G PO SOLR
0.5000 | Freq: Once | ORAL | Status: AC
  Administered 2014-11-03: 0.5 via ORAL
  Filled 2014-11-02: qty 1

## 2014-11-02 MED ORDER — VITAMIN K1 10 MG/ML IJ SOLN
5.0000 mg | Freq: Once | INTRAVENOUS | Status: AC
  Administered 2014-11-02: 5 mg via INTRAVENOUS
  Filled 2014-11-02: qty 0.5

## 2014-11-02 NOTE — Progress Notes (Signed)
Patient ID: Allison Sellers, female   DOB: Dec 21, 1926, 81 y.o.   MRN: [id number]  [facility] PROGRESS NOTE  Allison Sellers YWV:[id number] DOB: 12-27-1926 DOA: 11/01/2014 PCP: Garnet Koyanagi, DO   Brief narrative:    81 y.o. female, with HTN, a-fib on Xarelto, presented to [facility] ED with sudden onset of acute rectal bleed that occurred several hours prior to this admission. Pt reported mixed blood dark and bright red blood. This was associated with some fatigue adn exertional dyspnea.   In the emergency department the patient had a short episode of nonsustained V. tach on telemetry. The emergency department physician discussed this with Dr. Rayann Heman who requested to be consulted if these episodes continue. Her hemoglobin was 13.3, Cr at baseline 1.39. [facility] asked to admit for further evaluation and GI team has been consulted.   Assessment/Plan:    Principal Problem:   Acute blood loss anemia secondary to rectal bleeding, in pt who is on [facility] with Xarelto - drop in Hg from 13.3 --> 11.5 this AM - AC has been on hold - plan for colonoscopy in AM, appreciate GI team assistance - repeat CBC in AM  Active Problems:   Hyperkalemia - pt has been on Lisinopril at home which could have caused hyperkalemia  - lisinopril is now on hold - give on dose of Kayexalate and repeat BMP in AM    Atrial fibrillation - with CHADS2 = 3 - off AC since admission  - monitor on telemetry  - continue Metoprolol for rate control     Essential hypertension - reasonable inpatient control - hold lisinopril and continue Metoprolol for now    Diastolic dysfunction- grade 2 by echo Oct 2012 - no signs of volume overload on exam this AM - weight is stable at 62.5 kg - monitor daily weights, strict I/O    Hyponatremia - likely pre renal from blood loss and poor oral intake - repeat BMP in AM    Acute on Chronic kidney disease, stage II-III, pre renal etiology - from acute blood loss and poor oral intake, imposed  on CKD  - Cr slightly down since admission, continue to hold Lisinopril  - repeat BMP In AM  DVT prophylaxis - SCD's  Code Status: DNR Family Communication:  plan of care discussed with the patient and daughter at bedside  Disposition Plan: Home when stable.   IV access:  Peripheral IV  Procedures and diagnostic studies:    No results found.  Medical Consultants:  GI   Other Consultants:  None  IAnti-Infectives:   None  Faye Ramsay, MD  [facility] Pager [phone]  If 7PM-7AM, please contact night-coverage [email or web address] Password [email or web address] 11/02/2014, 11:20 AM   LOS: 1 day   HPI/Subjective: No events overnight. Still occasional episodes of rectal bleeding.   Objective: Filed Vitals:   11/01/14 2133 11/01/14 2223 11/02/14 0445 11/02/14 1041  BP: 112/44 120/57 124/56 135/57  Pulse: 72 75 72 70  Temp: 99 F (37.2 C)  98.2 F (36.8 C) 98 F (36.7 C)  TempSrc: Oral  Oral Oral  Resp: 21  20   Height:      Weight:   62.551 kg (137 lb 14.4 oz)   SpO2: 94%  94% 98%    Intake/Output Summary (Last 24 hours) at 11/02/14 1120 Last data filed at 11/02/14 1000  Gross per 24 hour  Intake   1053 ml  Output   1100 ml  Net    -47 ml  Exam:   General:  Pt is alert, follows commands appropriately, not in acute distress  Cardiovascular: Regular rate and rhythm, no rubs, no gallops  Respiratory: Clear to auscultation bilaterally, no wheezing, no crackles, no rhonchi  Abdomen: Soft, non tender, non distended, bowel sounds present, no guarding  Extremities: No edema, pulses DP and PT palpable bilaterally  Neuro: Grossly nonfocal  Data Reviewed: Basic Metabolic Panel:  Recent Labs Lab 11/01/14 0448 11/01/14 0910 11/02/14 0527  NA 133*  --  132*  K 5.5*  --  5.2*  CL 100*  --  99*  CO2 23  --  26  GLUCOSE 115*  --  104*  BUN 19  --  25*  CREATININE 1.39*  --  1.29*  CALCIUM 9.5  --  8.7*  MG  --  1.9  --    Liver Function Tests:  Recent Labs Lab  11/01/14 0448  AST 19  ALT 11*  ALKPHOS 63  BILITOT 0.8  PROT 6.9  ALBUMIN 3.6   CBC:  Recent Labs Lab 11/01/14 0448 11/01/14 0910 11/02/14 0527  WBC 9.0 9.9 8.6  HGB 13.3 13.6 11.5*  HCT 38.6 39.7 33.7*  MCV 91.5 90.6 92.1  PLT 327 357 286   Scheduled Meds: . dronedarone  400 mg Oral BID WC  . feeding supplement (ENSURE ENLIVE)  237 mL Oral BID BM  . metoprolol tartrate  12.5 mg Oral BID  . peg 3350 powder  0.5 kit Oral Once   And  . [START ON 11/03/2014] peg 3350 powder  0.5 kit Oral Once  . phytonadione (VITAMIN K) IV  5 mg Intravenous Once  . polyethylene glycol  17 g Oral BID  . sodium chloride  3 mL Intravenous Q12H  . sodium chloride  3 mL Intravenous Q12H  . sodium polystyrene  15 g Oral Once  . sodium polystyrene  15 g Oral Once  . [START ON 11/04/2014] Vitamin D (Ergocalciferol)  50,000 Units Oral Q Wed   Continuous Infusions: . sodium chloride 20 mL/hr at 11/01/14 2215

## 2014-11-02 NOTE — Progress Notes (Signed)
Initial Nutrition Assessment  DOCUMENTATION CODES:  Not applicable  INTERVENTION:  Ensure Enlive po BID, each supplement provides 350 kcal and 20 grams of protein  NUTRITION DIAGNOSIS:  Inadequate oral intake related to altered GI function as evidenced by per patient/family report.   GOAL:  Patient will meet greater than or equal to 90% of their needs   MONITOR:  PO intake, Supplement acceptance, Diet advancement, Weight trends, Labs, Skin, I & O's  REASON FOR ASSESSMENT:  Malnutrition Screening Tool    ASSESSMENT: Allison Sellers is a 81 y.o. female, with a past medical history of hypertension, atrial fibrillation, and osteopenia. She takes Xarelto for A. fib. She presents to the emergency department with rectal bleeding that started at 3 AM this morning.  Pt admitted with diverticular bleed.   Hx obtained by both pt and daughter at bedside. Pt reports a decreased appetite over the past 3-4 weeks, complaining of early satiety. She reveals that she has been eating less at home and often forces herself to eat. She has recently been supplementing her diet with Ensure supplements (between one per day and 2-3 per week) depending on her intake. She is consuming her clear liquid diet well. She reveals that she consumed 100% of her lunch tray today without difficulty.   She reveals UBW of 145#. She admits to both intentional and unintentional weight loss. Per pt daughter, pt started avoiding added sugars in her diet (sweet tea, sodas, cakes and cookies) starting in January, per the recommendation of her nephrologist. However, more recently she suspects losing weight due to increased intake. She denies any changes in the way her clothes fit and is active and independent at baseline.   Pt reports that she is scheduled for a colonoscopy tomorrow. She reports she has been drinking Ensure supplements and would like to continue them. Encouraged continued supplement use when appetite is poor and  discussed importance of getting adequate protein in her diet. Pt eats a generally healthy diet at baseline which includes vegetables, chicken, fish, and nuts.  Nutrition-Focused physical exam completed. Findings are mild fat depletion (in orbital area only), no muscle depletion, and no edema.   Labs reviewed: Na: 132, K: 5.2.   Height:  Ht Readings from Last 1 Encounters:  11/01/14 5' (1.524 m)    Weight:  Wt Readings from Last 1 Encounters:  11/02/14 137 lb 14.4 oz (62.551 kg)    Ideal Body Weight:  45.5 kg  Wt Readings from Last 10 Encounters:  11/02/14 137 lb 14.4 oz (62.551 kg)  09/24/14 135 lb 3.2 oz (61.326 kg)  04/21/14 144 lb (65.318 kg)  11/13/13 144 lb (65.318 kg)  09/19/13 147 lb 1.6 oz (66.724 kg)  05/26/13 144 lb 12.8 oz (65.681 kg)  04/14/13 145 lb 9.6 oz (66.044 kg)  03/24/13 145 lb (65.772 kg)  02/17/13 148 lb 8 oz (67.359 kg)  12/03/12 141 lb 9.6 oz (64.229 kg)    BMI:  Body mass index is 26.93 kg/(m^2).  Estimated Nutritional Needs:  Kcal:  1600-1800  Protein:  70-80 grams  Fluid:  1.6-1.8 L  Skin:  Reviewed, no issues  Diet Order:  Diet clear liquid Room service appropriate?: Yes; Fluid consistency:: Thin Diet NPO time specified  EDUCATION NEEDS:  Education needs addressed   Intake/Output Summary (Last 24 hours) at 11/02/14 1517 Last data filed at 11/02/14 1300  Gross per 24 hour  Intake   1053 ml  Output   1350 ml  Net   -297 ml  Last BM:  11/02/14  Geselle Cardosa A. Jimmye Norman, RD, LDN, CDE Pager: [phone] After hours Pager: [phone]

## 2014-11-02 NOTE — Progress Notes (Signed)
          Daily Rounding Note  11/02/2014, 9:00 AM  LOS: 1 day   SUBJECTIVE:       No problems.  Non-bloody but dark stool this AM.  No abd pain, nausea, dizziness, weakness, dyspnea, chest pain.  Feels well.   OBJECTIVE:         Vital signs in last 24 hours:    Temp:  [97.8 F (36.6 C)-99 F (37.2 C)] 98.2 F (36.8 C) (06/06 0445) Pulse Rate:  [72-78] 72 (06/06 0445) Resp:  [20-21] 20 (06/06 0445) BP: (112-124)/(44-76) 124/56 mmHg (06/06 0445) SpO2:  [94 %-98 %] 94 % (06/06 0445) Weight:  [137 lb 14.4 oz (62.551 kg)] 137 lb 14.4 oz (62.551 kg) (06/06 0445) Last BM Date: 11/01/14 Filed Weights   11/01/14 0433 11/01/14 0821 11/02/14 0445  Weight: 138 lb (62.596 kg) 133 lb 6.4 oz (60.51 kg) 137 lb 14.4 oz (62.551 kg)   General: looks well.  comfortable   Heart: RRR Chest: excellent BS.  A few dry crackles in bases right. left Abdomen: soft, NT, ND.  Active BS  Extremities: no CCE Neuro/Psych:  Pleasant, alert, oriented x 3.  No gross deficits.   Intake/Output from previous day: 06/05 0701 - 06/06 0700 In: 813 [P.O.:640; I.V.:173] Out: 600 [Urine:600]  Intake/Output this shift: Total I/O In: -  Out: 200 [Urine:200]  Lab Results:  Recent Labs  11/01/14 0448 11/01/14 0910 11/02/14 0527  WBC 9.0 9.9 8.6  HGB 13.3 13.6 11.5*  HCT 38.6 39.7 33.7*  PLT 327 357 286   BMET  Recent Labs  11/01/14 0448 11/02/14 0527  NA 133* 132*  K 5.5* 5.2*  CL 100* 99*  CO2 23 26  GLUCOSE 115* 104*  BUN 19 25*  CREATININE 1.39* 1.29*  CALCIUM 9.5 8.7*   LFT  Recent Labs  11/01/14 0448  PROT 6.9  ALBUMIN 3.6  AST 19  ALT 11*  ALKPHOS 63  BILITOT 0.8   PT/INR  Recent Labs  11/01/14 0448  LABPROT 25.7*  INR 2.38*   Hepatitis Panel No results for input(s): HEPBSAG, HCVAB, HEPAIGM, HEPBIGM in the last 72 hours.  Studies/Results: No results found.   Scheduled Meds: . dronedarone  400 mg Oral BID WC  .  feeding supplement (ENSURE ENLIVE)  237 mL Oral BID BM  . metoprolol tartrate  12.5 mg Oral BID  . polyethylene glycol  17 g Oral BID  . sodium chloride  3 mL Intravenous Q12H  . sodium chloride  3 mL Intravenous Q12H  . sodium polystyrene  15 g Oral Once  . [START ON 11/04/2014] Vitamin D (Ergocalciferol)  50,000 Units Oral Q Wed   Continuous Infusions: . sodium chloride 20 mL/hr at 11/01/14 2215   PRN Meds:.sodium chloride, nitroGLYCERIN, sodium chloride   ASSESMENT:   * Painless hematochezia. Rule out diverticular bleed, r/o neoplasia, r/o AVMs. Hgb has dropped overnight.  No need of transfusion at present.    * Afib, chronic Xarelto. On hold, last dose was 6/4 PM.  Sinus rhythm on monitor.  Note elevated PT/INR  *  Hyperkalemia.  Kayexalate ordered in ED was cancelled and never given.   PLAN   *  Colonoscopy tomorrow.  *  Recheck potassium level this afternoon. Spoke with Dr Doyle Askew and will order the Kayexalate now.  *  Give vitamin K and check coags in AM.    Allison Sellers  11/02/2014, 9:00 AM Pager: [phone]

## 2014-11-03 ENCOUNTER — Encounter (HOSPITAL_COMMUNITY): Payer: Self-pay | Admitting: *Deleted

## 2014-11-03 ENCOUNTER — Encounter (HOSPITAL_COMMUNITY)
Admission: EM | Disposition: A | Discharge status: Home / Self Care | Payer: Self-pay | Source: Home / Self Care | Attending: Internal Medicine

## 2014-11-03 DIAGNOSIS — K922 Gastrointestinal hemorrhage, unspecified: Secondary | ICD-10-CM

## 2014-11-03 HISTORY — PX: COLONOSCOPY: SHX5424

## 2014-11-03 LAB — CBC
HEMATOCRIT: 38.4 % (ref 36.0–46.0)
Hemoglobin: 12.8 g/dL (ref 12.0–15.0)
MCH: 30.8 pg (ref 26.0–34.0)
MCHC: 33.3 g/dL (ref 30.0–36.0)
MCV: 92.3 fL (ref 78.0–100.0)
Platelets: 333 10*3/uL (ref 150–400)
RBC: 4.16 MIL/uL (ref 3.87–5.11)
RDW: 13.6 % (ref 11.5–15.5)
WBC: 8.1 10*3/uL (ref 4.0–10.5)

## 2014-11-03 LAB — BASIC METABOLIC PANEL
Anion gap: 9 (ref 5–15)
BUN: 16 mg/dL (ref 6–20)
CO2: 22 mmol/L (ref 22–32)
Calcium: 9.2 mg/dL (ref 8.9–10.3)
Chloride: 101 mmol/L (ref 101–111)
Creatinine, Ser: 1.07 mg/dL — ABNORMAL HIGH (ref 0.44–1.00)
GFR calc Af Amer: 53 mL/min — ABNORMAL LOW (ref >60)
GFR calc non Af Amer: 46 mL/min — ABNORMAL LOW (ref >60)
Glucose, Bld: 99 mg/dL (ref 65–99)
POTASSIUM: 4.7 mmol/L (ref 3.5–5.1)
Sodium: 132 mmol/L — ABNORMAL LOW (ref 135–145)

## 2014-11-03 LAB — PROTIME-INR
INR: 1.2 (ref 0.00–1.49)
Prothrombin Time: 15.3 seconds — ABNORMAL HIGH (ref 11.6–15.2)

## 2014-11-03 SURGERY — COLONOSCOPY
Anesthesia: Moderate Sedation

## 2014-11-03 MED ORDER — FENTANYL CITRATE (PF) 100 MCG/2ML IJ SOLN
INTRAMUSCULAR | Status: AC
  Filled 2014-11-03: qty 2

## 2014-11-03 MED ORDER — MIDAZOLAM HCL 5 MG/5ML IJ SOLN
INTRAMUSCULAR | Status: DC | PRN
  Administered 2014-11-03 (×3): 1 mg via INTRAVENOUS

## 2014-11-03 MED ORDER — FENTANYL CITRATE (PF) 100 MCG/2ML IJ SOLN
INTRAMUSCULAR | Status: DC | PRN
  Administered 2014-11-03 (×3): 12.5 ug via INTRAVENOUS

## 2014-11-03 MED ORDER — DIPHENHYDRAMINE HCL 50 MG/ML IJ SOLN
INTRAMUSCULAR | Status: DC | PRN
Start: 2014-11-03 — End: 2014-11-03
  Administered 2014-11-03: 12.5 mg via INTRAVENOUS

## 2014-11-03 MED ORDER — MIDAZOLAM HCL 5 MG/ML IJ SOLN
INTRAMUSCULAR | Status: AC
  Filled 2014-11-03: qty 2

## 2014-11-03 NOTE — Interval H&P Note (Signed)
History and Physical Interval Note:  11/03/2014 10:03 AM  Allison Sellers  has presented today for surgery, with the diagnosis of hematochezia.  The various methods of treatment have been discussed with the patient and family. After consideration of risks, benefits and other options for treatment, the patient has consented to  Procedure(s): COLONOSCOPY (N/A) as a surgical intervention .  The patient's history has been reviewed, patient examined, no change in status, stable for surgery.  I have reviewed the patient's chart and labs.  Questions were answered to the patient's satisfaction.     Pricilla Riffle. Fuller Plan

## 2014-11-03 NOTE — Op Note (Signed)
[facility] [place], [address]   COLONOSCOPY PROCEDURE REPORT  PATIENT: Allison Sellers, Allison Sellers  MR#: [id number] BIRTHDATE: 08/20/1926 , 65  yrs. old GENDER: female ENDOSCOPIST: Ladene Artist, MD, [facility] REFERRED [place]:[facility] PROCEDURE DATE:  11/03/2014 PROCEDURE:   Colonoscopy, diagnostic First Screening Colonoscopy - Avg.  risk and is 50 yrs.  old or older - No.  Prior Negative Screening - Now for repeat screening. N/A  History of Adenoma - Now for follow-up colonoscopy & has been > or = to 3 yrs.  N/A  Polyps removed today? No Recommend repeat exam, <10 yrs? No ASA CLASS:   Class III INDICATIONS:Evaluation of unexplained GI bleeding, Colorectal Neoplasm Risk Assessment for this procedure is average risk, and hematochezia. MEDICATIONS: Benadryl 12.5 mg IV, Fentanyl 37.5 mcg IV, and Versed 3 mg IV DESCRIPTION OF PROCEDURE:   After the risks benefits and alternatives of the procedure were thoroughly explained, informed consent was obtained.  The digital rectal exam revealed no abnormalities of the rectum.   The Pentax Ped Colon [id number] endoscope was introduced through the anus and advanced to the cecum, which was identified by both the appendix and ileocecal valve. No adverse events experienced.   The quality of the prep was good.  (MoviPrep was used)  The instrument was then slowly withdrawn as the colon was fully examined. Estimated blood loss is zero unless otherwise noted in this procedure report.  OLON FINDINGS: There was moderate diverticulosis noted in the transverse colon, at the hepatic flexure, and in the ascending colon.   There was severe diverticulosis noted in the sigmoid colon and descending colon with associated luminal narrowing, muscular hypertrophy and tortuosity.   The examination was otherwise normal. Retroflexed views revealed internal Grade I hemorrhoids. The time to cecum = 7.9 Withdrawal time = 8.6   The scope  was withdrawn and the procedure completed. COMPLICATIONS: There were no immediate complications.  ENDOSCOPIC IMPRESSION: 1.   Moderate diverticulosis in the transverse colon, at the hepatic flexure, and in the ascending colon 2.   Severe diverticulosis in the sigmoid colon and descending colon  3.   Grade l internal hemorrhoids  RECOMMENDATIONS: 1.  Low residue diet for 2 weeks then high fiber diet with liberal fluid intake long term. 2.  Should be OK to resume Xarelto in 5 days 3.  Avoid ASA/NSAIDs long term with Xarelto useage 4.  Outpatient follow up with PCP within 2 weeks. GI follow up as needed. GI signing off.  eSigned:  Ladene Artist, MD, [facility] 11/03/2014 10:41 AM

## 2014-11-03 NOTE — Progress Notes (Signed)
Patient ID: Allison Sellers, female   DOB: 07/24/1926, 81 y.o.   MRN: [id number]  [facility] PROGRESS NOTE  Allison Sellers WCB:[id number] DOB: [birth year] DOA: 11/01/2014 PCP: Garnet Koyanagi, DO   Brief narrative:    81 y.o. female, with HTN, a-fib on Xarelto, presented to [facility] ED with sudden onset of acute rectal bleed that occurred several hours prior to this admission. Pt reported mixed blood dark and bright red blood. This was associated with some fatigue adn exertional dyspnea.   In the emergency department the patient had a short episode of nonsustained V. tach on telemetry. The emergency department physician discussed this with Dr. Rayann Heman who requested to be consulted if these episodes continue. Her hemoglobin was 13.3, Cr at baseline 1.39. [facility] asked to admit for further evaluation and GI team has been consulted.   Assessment/Plan:    Principal Problem:   Acute blood loss anemia secondary to rectal bleeding, in pt who is on [facility] with Xarelto - drop in Hg from 13.3 --> 11.5 --> 12.8 this AM - AC has been on hold - per colonoscopy report, diverticulosis noted - recommendation is to hold Xarelto for more days post discharge, avoid NSAID's - repeat CBC in AM and d/c home if no further bleeding and Hg remains stable   Active Problems:   Hyperkalemia - pt has been on Lisinopril at home which could have caused hyperkalemia  - lisinopril has been on hold - repeat BMP in AM    Atrial fibrillation - with CHADS2 = 3 - off AC since admission  - continue Metoprolol for rate control     Essential hypertension - reasonable inpatient control - hold lisinopril and continue Metoprolol for now - may resume Lisinopril upon discharge if K is WNL    Diastolic dysfunction- grade 2 by echo Oct 2012 - no signs of volume overload on exam this AM - weight is stable at 63 kg - monitor daily weights, strict I/O    Hyponatremia - likely pre renal from blood loss and poor oral intake - repeat BMP  in AM    Acute on Chronic kidney disease, stage II-III, pre renal etiology - from acute blood loss and poor oral intake, imposed on CKD  - Cr slightly down since admission, continue to hold Lisinopril  - repeat BMP In AM  DVT prophylaxis - SCD's  Code Status: DNR Family Communication:  plan of care discussed with the patient and daughter at bedside  Disposition Plan: Home in AM and K and Hg stable   IV access:  Peripheral IV  Procedures and diagnostic studies:    ENDOSCOPIC IMPRESSION: Dr. Fuller Plan 11/03/2014 1. Moderate diverticulosis in the transverse colon, at the hepatic flexure, and in the ascending colon 2. Severe diverticulosis in the sigmoid colon and descending colon 3. Grade l internal hemorrhoids  RECOMMENDATIONS: 1. Low residue diet for 2 weeks then high fiber diet with liberal fluid intake long term. 2. Should be OK to resume Xarelto in 5 days 3. Avoid ASA/NSAIDs long term with Xarelto useage 4. Outpatient follow up with PCP within 2 weeks.   Medical Consultants:  GI   Other Consultants:  None  IAnti-Infectives:   None  Faye Ramsay, MD  [facility] Pager [phone]  If 7PM-7AM, please contact night-coverage [email or web address] Password [facility] 11/03/2014, 11:53 AM   LOS: 2 days   HPI/Subjective: No events overnight. Still occasional episodes of rectal bleeding but much better.  Objective: Filed Vitals:   11/03/14 1046 11/03/14 1050 11/03/14  1058 11/03/14 1120  BP:  139/60  125/79  Pulse: 61 69 61 60  Temp: 97.8 F (36.6 C)   97.5 F (36.4 C)  TempSrc: Oral   Oral  Resp: 18 17 20 18   Height:      Weight:      SpO2: 100% 98% 97% 97%    Intake/Output Summary (Last 24 hours) at 11/03/14 1153 Last data filed at 11/03/14 1145  Gross per 24 hour  Intake 1675.67 ml  Output    551 ml  Net 1124.67 ml    Exam:   General:  Pt is alert, follows commands appropriately, not in acute distress  Cardiovascular: Regular rate and rhythm, no rubs, no  gallops  Respiratory: Clear to auscultation bilaterally, no wheezing, no crackles, no rhonchi  Abdomen: Soft, non tender, non distended, bowel sounds present, no guarding  Extremities: No edema, pulses DP and PT palpable bilaterally  Neuro: Grossly nonfocal  Data Reviewed: Basic Metabolic Panel:  Recent Labs Lab 11/01/14 0448 11/01/14 0910 11/02/14 0527 11/02/14 1521 11/03/14 0651  NA 133*  --  132* 130* 132*  K 5.5*  --  5.2* 4.8 4.7  CL 100*  --  99* 98* 101  CO2 23  --  26 25 22   GLUCOSE 115*  --  104* 101* 99  BUN 19  --  25* 19 16  CREATININE 1.39*  --  1.29* 1.09* 1.07*  CALCIUM 9.5  --  8.7* 8.4* 9.2  MG  --  1.9  --   --   --    Liver Function Tests:  Recent Labs Lab 11/01/14 0448  AST 19  ALT 11*  ALKPHOS 63  BILITOT 0.8  PROT 6.9  ALBUMIN 3.6   CBC:  Recent Labs Lab 11/01/14 0448 11/01/14 0910 11/02/14 0527 11/03/14 0651  WBC 9.0 9.9 8.6 8.1  HGB 13.3 13.6 11.5* 12.8  HCT 38.6 39.7 33.7* 38.4  MCV 91.5 90.6 92.1 92.3  PLT 327 357 286 333   Scheduled Meds: . dronedarone  400 mg Oral BID WC  . feeding supplement (ENSURE ENLIVE)  237 mL Oral BID BM  . metoprolol tartrate  12.5 mg Oral BID  . polyethylene glycol  17 g Oral BID  . sodium chloride  3 mL Intravenous Q12H  . sodium chloride  3 mL Intravenous Q12H  . sodium polystyrene  15 g Oral Once  . [START ON 11/04/2014] Vitamin D (Ergocalciferol)  50,000 Units Oral Q Wed   Continuous Infusions: . sodium chloride 20 mL/hr at 11/01/14 2215  . sodium chloride 20 mL/hr at 11/02/14 2006

## 2014-11-03 NOTE — H&P (View-Only) (Signed)
[facility] Consult: 11:14 AM 11/01/2014  LOS: 0 days    Referring Provider: Dr Haze Boyden  Primary Care Physician:  Garnet Koyanagi, DO Primary Gastroenterologist:  None.     Reason for Consultation:  Painless hematochezia.    HPI: Allison Sellers is a 81 y.o. female.  On Xarelto, 81 ASA for a fib.  Previous cardioversion failed within 24 hours.  3 AM today awakened to have BM and passed large volume, pure blood.  Had 3 episodes at home, her 4th and last BM ~ 6 AM in the ED.  No abdominal pain, no n/v.  No previous BPR.  Not dizzy, no palpitations or chest pain.   No NSAIDs except the ASA.  Daily, brown, BMs.  Colonoscopy by PMD at time, Dr Mancel Bale, was ~ 20 yrs ago.  Pt not aware of diagnosis of diverticulosis or polyps.      Past Medical History  Diagnosis Date  . Hypertension   . Osteopenia   . Atrial fibrillation   . Seborrheic keratosis 06/2014    of hand.     Past Surgical History  Procedure Laterality Date  . Umbilical hernia repair    . Cardioversion  04/18/2011    Procedure: CARDIOVERSION;  Surgeon: Leonie Man;  Location: [facility] OR;  Service: Cardiovascular;  Laterality: N/A;  . Lexiscan myoview  04/14/2011    No scintigraphic evidence of inducible myocardial ischemia, EKG negative for ischemia, patient developed Atrial Fibrillation with rapid ventricular response during stress and persisted in the recovery period, abnormal myocardial perfusion study  . 2d echocardiogram  03/29/2011    EF 55-65%, normal    Prior to Admission medications   Medication Sig Start Date End Date Taking? Authorizing Provider  aspirin 81 MG tablet Take 81 mg by mouth daily.     Yes Historical Provider, MD  dronedarone (MULTAQ) 400 MG tablet Take 1 tablet (400 mg total) by mouth 2 (two) times daily with a meal. 09/24/14   Yes Leonie Man, MD  lisinopril (PRINIVIL,ZESTRIL) 10 MG tablet Take 1 tablet (10 mg total) by mouth daily. 04/21/14  Yes Alferd Apa Lowne, DO  metoprolol tartrate (LOPRESSOR) 25 MG tablet Take 0.5 tablets (12.5 mg total) by mouth 2 (two) times daily. 09/24/14  Yes Leonie Man, MD  nitroGLYCERIN (NITROSTAT) 0.4 MG SL tablet Place 1 tablet (0.4 mg total) under the tongue every 5 (five) minutes as needed. For chest pain 09/19/13  Yes Leonie Man, MD  Rivaroxaban (XARELTO) 15 MG TABS tablet Take 1 tablet (15 mg total) by mouth daily. 09/24/14  Yes Leonie Man, MD  Vitamin D, Ergocalciferol, (DRISDOL) 50000 UNITS CAPS capsule Take 50,000 Units by mouth every 7 (seven) days. Wednesday   Yes Historical Provider, MD  Rivaroxaban (XARELTO) 15 MG TABS tablet Take 1 tablet (15 mg total) by mouth 2 (two) times daily with a meal. Patient not taking: Reported on 11/01/2014 09/24/14   Leonie Man, MD    Scheduled Meds: . dronedarone  400 mg Oral BID WC  . feeding supplement (ENSURE  ENLIVE)  237 mL Oral BID BM  . metoprolol tartrate  12.5 mg Oral BID  . polyethylene glycol  17 g Oral BID  . sodium chloride  3 mL Intravenous Q12H  . sodium chloride  3 mL Intravenous Q12H  . sodium polystyrene  15 g Oral Once  . [START ON 11/04/2014] Vitamin D (Ergocalciferol)  50,000 Units Oral Q Wed   Infusions: . sodium chloride 75 mL/hr at 11/01/14 0924   PRN Meds: sodium chloride, nitroGLYCERIN, sodium chloride   Allergies as of 11/01/2014 - Review Complete 11/01/2014  Allergen Reaction Noted  . Ciprofloxacin Other (See Comments) 11/13/2013    Family History  Problem Relation Age of Onset  . Arthritis    . Heart failure Father     CHF  . Heart failure Mother     CHF  . Macular degeneration Mother     History   Social History  . Marital Status: Widowed    Spouse Name: N/A  . Number of Children: N/A  . Years of Education: N/A   Occupational History  . Not on file.   Social History  Main Topics  . Smoking status: Never Smoker   . Smokeless tobacco: Never Used  . Alcohol Use: No  . Drug Use: No  . Sexual Activity: Not Currently   Other Topics Concern  . Not on file   Social History Narrative   She is a widowed mother of 24, grandmother of 78, great grandmother of 1. She is usually very active and likes to walk .    recently.    She quit smoking 50 years ago. She denies any alcohol.    She says in order to try to make up for the fact she has not been doing much exercise she has been walking up and down the stairs at the house and staying as active as possible, just doing whatever activity she can do.     REVIEW OF SYSTEMS: Constitutional:  5# weight drop due to reducing sugar intake.  Mild fatigue in the last week but still able to do her housework.  Walking on treadmill until last week ENT:  No nose bleeds.  + bil hearing aids.  Pulm:  No cough, vague SOB CV:  No palpitations, no LE edema. No chest pain GU:  No hematuria, no frequency GI:  Per hpi.  No dysphagia.   Heme:  No issues with excessive bleeding or bruising   Transfusions:  None ever Neuro:  No headaches, no peripheral tingling or numbness.  Wears bifocals.  No blurry vision. Derm:  No itching, no rash or sores.  Endocrine:  No sweats or chills.  No polyuria or dysuria.  Stopped eating sugar earlier this year at MD suggestion, had glucose intolerance.  Immunization:  Reviewed, several up to date.     PHYSICAL EXAM: Vital signs in last 24 hours: Filed Vitals:   11/01/14 0819  BP: 136/64  Pulse: 74  Temp: 98.1 F (36.7 C)  Resp: 20   Wt Readings from Last 3 Encounters:  11/01/14 133 lb 6.4 oz (60.51 kg)  09/24/14 135 lb 3.2 oz (61.326 kg)  04/21/14 144 lb (65.318 kg)   General: pleasant, looks well and younger than 87 Head:  No swelling of face or asymmetry.  No signs of trauma  Eyes:  No icterus, conjunctiva is pink.  Ears:  Hearing normal with bil hearing aids in place.   Nose: no  discharge or congestion Mouth:  Clear, moist, tongue  midline.  Good dentition Neck:  No mass, JVD, TMG, adenopathy Lungs:  Dry crackles in bases.  Excellent BS.  No cough or dyspnea Heart: RRR.  No mrg.  s1 and s2 audible Abdomen:  Soft, NT. Active BS.  No masses, no hsm   Rectal: deferred   Musc/Skeltl: bi joint swelling , redness, contractures Extremities:  No CCE.  Feet warm, wll perfused  Neurologic:  Oriented x 3.  Limb strengthe full and equal.  No tremor.  No gross deficits Skin:  No rash or sores.  No telangectasi, no purpura Tattoos:  none Nodes:  No cervical or inguinal adenopathy   Psych:  Pleasant, cooperaritive, in good spirits.   Intake/Output from previous day:   Intake/Output this shift:    LAB RESULTS:  Recent Labs  11/01/14 0448 11/01/14 0910  WBC 9.0 9.9  HGB 13.3 13.6  HCT 38.6 39.7  PLT 327 357   BMET Lab Results  Component Value Date   NA 133* 11/01/2014   NA 130* 05/19/2014   NA 133* 05/12/2014   K 5.5* 11/01/2014   K 4.3 05/19/2014   K 4.5 05/12/2014   CL 100* 11/01/2014   CL 99 05/19/2014   CL 103 05/12/2014   CO2 23 11/01/2014   CO2 23 05/19/2014   CO2 22 05/12/2014   GLUCOSE 115* 11/01/2014   GLUCOSE 118* 05/19/2014   GLUCOSE 134* 05/12/2014   BUN 19 11/01/2014   BUN 21 05/19/2014   BUN 23 05/12/2014   CREATININE 1.39* 11/01/2014   CREATININE 1.2 05/19/2014   CREATININE 1.4* 05/12/2014   CALCIUM 9.5 11/01/2014   CALCIUM 9.0 05/19/2014   CALCIUM 9.2 05/12/2014   LFT  Recent Labs  11/01/14 0448  PROT 6.9  ALBUMIN 3.6  AST 19  ALT 11*  ALKPHOS 63  BILITOT 0.8   PT/INR Lab Results  Component Value Date   INR 2.38* 11/01/2014   INR 1.04 03/27/2011   INR 1.0 03/27/2011    RADIOLOGY STUDIES: No results found.  ENDOSCOPIC STUDIES: Remote colonoscopy per Dr Orene Desanctis, op note not in [place].   IMPRESSION:   *  Painless hematochezia.  Rule out diverticular bleed, r/o neoplasia, r/o AVMs. Hgb stable c/w 8  months ago.   *  Afib, chronic Xarelto.  On hold, last dose was 6/4 PM.    PLAN:     *  Colonoscopy this week, timing to be detrmined but probably Tuesday, at which point will have been 2 full days off Xarelto.  *  CBC in AM instead of q 6 hours.  *  Will allow solids until tomorrow, currently on full liquids.  Start clears in AM.     Azucena Freed  11/01/2014, 11:14 AM Pager: [phone] Attending MD note:   I have taken a history, examined the patient, and reviewed the chart. I agree with the Advanced Practitioner's impression and recommendations.  Small bloody stool earlier today. Painless rectal bleeding consistent wiht a diverticular bleed.Hemodynamically stable.Plan colonoscopy when Xarelto off x 48 hours. [facility] Pager # [phone]

## 2014-11-03 NOTE — Progress Notes (Signed)
Utilization review completed.  L J Aking Klabunde RN, BSN, CM  [phone] 

## 2014-11-04 ENCOUNTER — Telehealth: Payer: Self-pay | Admitting: Cardiology

## 2014-11-04 LAB — CBC
HEMATOCRIT: 35.8 % — AB (ref 36.0–46.0)
Hemoglobin: 12.3 g/dL (ref 12.0–15.0)
MCH: 31.9 pg (ref 26.0–34.0)
MCHC: 34.4 g/dL (ref 30.0–36.0)
MCV: 92.7 fL (ref 78.0–100.0)
PLATELETS: 333 10*3/uL (ref 150–400)
RBC: 3.86 MIL/uL — ABNORMAL LOW (ref 3.87–5.11)
RDW: 13.5 % (ref 11.5–15.5)
WBC: 9 10*3/uL (ref 4.0–10.5)

## 2014-11-04 LAB — BASIC METABOLIC PANEL
ANION GAP: 9 (ref 5–15)
BUN: 18 mg/dL (ref 6–20)
CHLORIDE: 100 mmol/L — AB (ref 101–111)
CO2: 21 mmol/L — AB (ref 22–32)
Calcium: 9 mg/dL (ref 8.9–10.3)
Creatinine, Ser: 1.17 mg/dL — ABNORMAL HIGH (ref 0.44–1.00)
GFR calc Af Amer: 48 mL/min — ABNORMAL LOW (ref >60)
GFR calc non Af Amer: 41 mL/min — ABNORMAL LOW (ref >60)
GLUCOSE: 97 mg/dL (ref 65–99)
Potassium: 4.7 mmol/L (ref 3.5–5.1)
SODIUM: 130 mmol/L — AB (ref 135–145)

## 2014-11-04 LAB — MAGNESIUM: MAGNESIUM: 1.9 mg/dL (ref 1.7–2.4)

## 2014-11-04 NOTE — Telephone Encounter (Signed)
I think we are fine restarting Xarelto.  If she has normally is on Xarelto, I would probably consider switching her to low-dose ELIQUIS. I don't think warfarin makes her less likely to bleed. Aspirin will not be protective  DH.

## 2014-11-04 NOTE — Discharge Summary (Signed)
Physician Discharge Summary  Allison Sellers YKD:[id number] DOB: 10-31-1926 DOA: 11/01/2014  PCP: Allison Koyanagi, DO  Admit date: 11/01/2014 Discharge date: 11/04/2014  Time spent: 35 minutes  Recommendations for Outpatient Follow-up:  1. Patient was discontinued off of novel oral anticoagulant temporarily until 11/09/14. Patient's aspirin 81 mg was discontinued completely on discharge with further instructions follow-up with primary care physician and cardiology regarding this 2. Patient should have a blood count and basic metabolic panel in about one week at primary care physician's office 3. Patient should be monitored closely for other cardiac related issues-patient is on Dronadarone and will need to have probably a TSH as well as a magnesium level done as an outpatient 4. Consider workup for hyponatremia versus liberalizing diet once seen at PCP office  Discharge Diagnoses:  Principal Problem:   Rectal bleeding Active Problems:   Essential hypertension   Disorder of bone and cartilage   Paroxysmal atrial fibrillation - controlled with Multaq and metoprolol   Chronic anticoagulation   Diastolic dysfunction- grade 2 by echo Oct 2012   Hyperkalemia   Chronic kidney disease   GI bleed   Discharge Condition: Good  Diet recommendation: Heart healthy low-salt low residue diet with liberal fluid intake  Filed Weights   11/02/14 0445 11/03/14 0551 11/04/14 0701  Weight: 62.551 kg (137 lb 14.4 oz) 63.095 kg (139 lb 1.6 oz) 61.2 kg (134 lb 14.7 oz)    History of present illness:  81 y.o. female, with HTN, a-fib on Xarelto [patient is on multaq as well as metoprolol for rate and rhythm control]  She Presented to [facility] ED 11/01/14 with sudden onset of acute rectal bleed that occurred several hours prior to this admission.  Pt reported mixed blood dark and bright red blood. This was associated with some fatigue adn exertional dyspnea.   In the emergency department the patient had a short episode of  nonsustained V. tach on telemetry. The emergency department physician discussed this with Dr. Rayann Heman who requested to be consulted if these episodes continue.  Her hemoglobin was 13.3, Cr at baseline 1.39.    Hospital Course:  Principal Problem:   Acute blood loss anemia secondary to rectal bleeding, in pt who is on [facility] with Xarelto - drop in Hg from 13.3 --> 11.5 --> 12.2 on day of discharge - AC has been on hold - per colonoscopy report, diverticulosis noted  - recommendation is to hold Xarelto until 6/13, no further aspirin and avoid NSAID's  Active Problems:  Hyperkalemia - pt has been on Lisinopril at home which could have caused hyperkalemia  - lisinopril has been on hold and was resumed on discharge - repeat BMP as an outpatient   Atrial fibrillation - with CHADS2 = 3 - off AC since admission  - continue Metoprolol as well as dronadarone for rate control  -Magnesium level this admission was 1.9-   Essential hypertension - reasonable inpatient control   Diastolic dysfunction- grade 2 by echo Oct 2012 - no signs of volume overload on exam this AM - weight is stable at 63 kg - monitor daily weights, strict I/O   Hyponatremia - likely pre renal from blood loss and poor oral intake -Will need outpatient optimization. Would consider liberalizing diet to regular salt in the diet as may not be eating enough -Urine studies were not done this hospital visit however may need to be done as an outpatient to determine etiology   Acute on Chronic kidney disease, stage II-III, pre renal etiology -  from acute blood loss and poor oral intake, imposed on CKD  - Cr slightly down since admission, continue to hold Lisinopril  - repeat BMP In AM  Procedures: RECOMMENDATIONS: 1. Low residue diet for 2 weeks then high fiber diet with liberal fluid intake long term. 2. Should be OK to resume Xarelto in 5 days 3. Avoid ASA/NSAIDs long term with Xarelto useage 4. Outpatient  follow up with PCP within 2 weeks. GI follow up as   Consultations: Gastroenterology    Discharge Exam: Filed Vitals:   11/04/14 0701  BP: 157/63  Pulse: 61  Temp: 97.6 F (36.4 C)  Resp: 20    General: Alert pleasant oriented EOMI NCAT  Cardiovascular: S1-S2 no murmur rub or gallop  Respiratory: Clinically clear    Discharge Instructions   Discharge Instructions    Diet - low sodium heart healthy    Complete by:  As directed      Discharge instructions    Complete by:  As directed   Do not take Rivaroxaban until 11/09/14 Stop aspirin completely until he speak to either your primary care physician or your cardiologist The gastroenterologist that case I'll will need follow-up with you and probably 1-2 months We will set up an appointment for you to see her primary care physician within a week. If you experience large amounts of bleeding or lightheadedness from the rectum, please report to the nearest emergency room There have been no other significant changes to medications     Increase activity slowly    Complete by:  As directed           Current Discharge Medication List    CONTINUE these medications which have NOT CHANGED   Details  dronedarone (MULTAQ) 400 MG tablet Take 1 tablet (400 mg total) by mouth 2 (two) times daily with a meal. Qty: 180 tablet, Refills: 3    lisinopril (PRINIVIL,ZESTRIL) 10 MG tablet Take 1 tablet (10 mg total) by mouth daily. Qty: 90 tablet, Refills: 3   Associated Diagnoses: Essential hypertension    metoprolol tartrate (LOPRESSOR) 25 MG tablet Take 0.5 tablets (12.5 mg total) by mouth 2 (two) times daily. Qty: 90 tablet, Refills: 3    nitroGLYCERIN (NITROSTAT) 0.4 MG SL tablet Place 1 tablet (0.4 mg total) under the tongue every 5 (five) minutes as needed. For chest pain Qty: 25 tablet, Refills: 6    Vitamin D, Ergocalciferol, (DRISDOL) 50000 UNITS CAPS capsule Take 50,000 Units by mouth every 7 (seven) days. Wednesday       STOP taking these medications     aspirin 81 MG tablet      Rivaroxaban (XARELTO) 15 MG TABS tablet      Rivaroxaban (XARELTO) 15 MG TABS tablet        Allergies  Allergen Reactions  . Ciprofloxacin Other (See Comments)    Cant be given with her multaq      The results of significant diagnostics from this hospitalization (including imaging, microbiology, ancillary and laboratory) are listed below for reference.    Significant Diagnostic Studies: No results found.  Microbiology: No results found for this or any previous visit (from the past 240 hour(s)).   Labs: Basic Metabolic Panel:  Recent Labs Lab 11/01/14 0448 11/01/14 0910 11/02/14 0527 11/02/14 1521 11/03/14 0651 11/04/14 0725 11/04/14 0900  NA 133*  --  132* 130* 132* 130*  --   K 5.5*  --  5.2* 4.8 4.7 4.7  --   CL 100*  --  99* 98* 101 100*  --   CO2 23  --  26 25 22  21*  --   GLUCOSE 115*  --  104* 101* 99 97  --   BUN 19  --  25* 19 16 18   --   CREATININE 1.39*  --  1.29* 1.09* 1.07* 1.17*  --   CALCIUM 9.5  --  8.7* 8.4* 9.2 9.0  --   MG  --  1.9  --   --   --   --  1.9   Liver Function Tests:  Recent Labs Lab 11/01/14 0448  AST 19  ALT 11*  ALKPHOS 63  BILITOT 0.8  PROT 6.9  ALBUMIN 3.6   No results for input(s): LIPASE, AMYLASE in the last 168 hours. No results for input(s): AMMONIA in the last 168 hours. CBC:  Recent Labs Lab 11/01/14 0448 11/01/14 0910 11/02/14 0527 11/03/14 0651 11/04/14 0725  WBC 9.0 9.9 8.6 8.1 9.0  HGB 13.3 13.6 11.5* 12.8 12.3  HCT 38.6 39.7 33.7* 38.4 35.8*  MCV 91.5 90.6 92.1 92.3 92.7  PLT 327 357 286 333 333   Cardiac Enzymes: No results for input(s): CKTOTAL, CKMB, CKMBINDEX, TROPONINI in the last 168 hours. BNP: BNP (last 3 results) No results for input(s): BNP in the last 8760 hours.  ProBNP (last 3 results) No results for input(s): PROBNP in the last 8760 hours.  CBG: No results for input(s): GLUCAP in the last 168  hours.     SignedNita Sells  [facility] 11/04/2014, 10:28 AM

## 2014-11-04 NOTE — Telephone Encounter (Signed)
Spoke with patient's daughter. She is being discharged today after GI bleed. She has been off xarelto and was advised per Dr. Arlyss Queen discharge summary to resume xarelto 5 days post discharge. Per daughter, another MD had mentioned to them warfarin or aspirin instead of xarelto.   Patient's daughter would like Dr. Allison Quarry opinion on this and would like a follow up with him. Informed daughter he is out of office this week and booked on his quarter day next week but we can get her in with an APP.   Will route message to Dr. Sharlyn Bologna, RN to review and advise on anticoagulant  Will route message to Dr. Allison Quarry scheduler to set patient up for appointment

## 2014-11-04 NOTE — Telephone Encounter (Signed)
Pt's daughter called in stating that the pt was admitted in to the hospital on 6/5 and while she has been there, she has been taken off of her Xarelto. She is going to be discharged at some point today and the doctor was recommending that she be seen by Dr. Ellyn Hack this week to let her know when she needs to start back taking her Xarelto. Please f/u with daughter  Thanks

## 2014-11-04 NOTE — Progress Notes (Signed)
Nsg Discharge Note  Admit Date:  11/01/2014 Discharge date: 11/04/2014   Allison Sellers to be D/C'd Home per MD order.  AVS completed.  Copy for chart, and copy for patient signed, and dated. Patient/caregiver able to verbalize understanding.  Discharge Medication:   Medication List    STOP taking these medications        aspirin 81 MG tablet     Rivaroxaban 15 MG Tabs tablet  Commonly known as:  XARELTO      TAKE these medications        dronedarone 400 MG tablet  Commonly known as:  MULTAQ  Take 1 tablet (400 mg total) by mouth 2 (two) times daily with a meal.     lisinopril 10 MG tablet  Commonly known as:  PRINIVIL,ZESTRIL  Take 1 tablet (10 mg total) by mouth daily.     metoprolol tartrate 25 MG tablet  Commonly known as:  LOPRESSOR  Take 0.5 tablets (12.5 mg total) by mouth 2 (two) times daily.     nitroGLYCERIN 0.4 MG SL tablet  Commonly known as:  NITROSTAT  Place 1 tablet (0.4 mg total) under the tongue every 5 (five) minutes as needed. For chest pain     Vitamin D (Ergocalciferol) 50000 UNITS Caps capsule  Commonly known as:  DRISDOL  Take 50,000 Units by mouth every 7 (seven) days. Wednesday        Discharge Assessment: Filed Vitals:   11/04/14 1424  BP: 133/63  Pulse: 73  Temp: 98.3 F (36.8 C)  Resp: 16   Skin clean, dry and intact without evidence of skin break down, no evidence of skin tears noted. IV catheter discontinued intact. Site without signs and symptoms of complications - no redness or edema noted at insertion site, patient denies c/o pain - only slight tenderness at site.  Dressing with slight pressure applied.  D/c Instructions-Education: Discharge instructions given to patient/family with verbalized understanding. D/c education completed with patient/family including follow up instructions, medication list, d/c activities limitations if indicated, with other d/c instructions as indicated by MD - patient able to verbalize understanding,  all questions fully answered. Patient instructed to return to ED, call 911, or call MD for any changes in condition.  Patient escorted via [place], and D/C home via private auto.  Allison Sellers Margaretha Sheffield, RN 11/04/2014 3:46 PM

## 2014-11-04 NOTE — Telephone Encounter (Signed)
Resume Xarelto or changed to Eliquis 2.5mg  BID?

## 2014-11-05 ENCOUNTER — Telehealth: Payer: Self-pay | Admitting: *Deleted

## 2014-11-05 ENCOUNTER — Encounter (HOSPITAL_COMMUNITY): Payer: Self-pay | Admitting: Gastroenterology

## 2014-11-05 NOTE — Telephone Encounter (Signed)
Spoke with patient's daughter and informed her that patient should resume xarelto as directed per discharge summary - which is Monday June 13. She asked about lisinopril, as she states patient was not given this medication in the hospital and also if patient should take aspirin - informed daughter that lisinopril was on the "continue" list and aspirin was on "discontinue" list.   Will route message to scheduling pool to contact patient for hospital follow up appointment

## 2014-11-05 NOTE — Telephone Encounter (Signed)
Transition Care Management Follow-up Telephone Call  How have you been since you were released from the hospital? Good- has not noticed any blood in stool, eating normally and feeling well   Do you understand why you were in the hospital? Rectal bleeding    Do you understand the discharge instrcutions? Patient states she is also seeing Dr. Ellyn Hack next week   Items Reviewed:  Medications reviewed: YES   Allergies reviewed: YES   Dietary changes reviewed: YES- low fiber diet, watch eating nuts and anything with seeds     Functional Questionnaire:   Activities of Daily Living (ADLs):   She states they are independent in the following: medications, ambulation, restroom, transportation  States they require assistance with the following: Patient states she was advised to have family check on her frequently for observation, but she still feels she is able to meet all of her needs    Any transportation issues/concerns?:No   Any patient concerns? NO   Confirmed importance and date/time of follow-up visits scheduled:YES- scheduled 11/12/14 with Dr. Etter Sjogren    Confirmed with patient if condition begins to worsen call PCP or go to the ER.  Patient was given the Call-a-Nurse line [phone]: YES

## 2014-11-05 NOTE — Telephone Encounter (Signed)
Resume Xarelto for now -- would change if she has more bleeding.  [place]

## 2014-11-06 NOTE — Telephone Encounter (Signed)
Can this encounter be closed?

## 2014-11-10 NOTE — Telephone Encounter (Signed)
This patient needs follow up appointment then ,you can close this encounter

## 2014-11-12 ENCOUNTER — Encounter: Payer: Self-pay | Admitting: Family Medicine

## 2014-11-12 ENCOUNTER — Ambulatory Visit (INDEPENDENT_AMBULATORY_CARE_PROVIDER_SITE_OTHER): Payer: Medicare Other | Admitting: Family Medicine

## 2014-11-12 VITALS — BP 124/80 | HR 66 | Temp 98.2°F | Wt 133.8 lb

## 2014-11-12 DIAGNOSIS — I10 Essential (primary) hypertension: Secondary | ICD-10-CM

## 2014-11-12 DIAGNOSIS — Z8719 Personal history of other diseases of the digestive system: Secondary | ICD-10-CM | POA: Diagnosis not present

## 2014-11-12 DIAGNOSIS — K922 Gastrointestinal hemorrhage, unspecified: Secondary | ICD-10-CM

## 2014-11-12 DIAGNOSIS — E871 Hypo-osmolality and hyponatremia: Secondary | ICD-10-CM | POA: Diagnosis not present

## 2014-11-12 LAB — BASIC METABOLIC PANEL
BUN: 20 mg/dL (ref 6–23)
CALCIUM: 9.4 mg/dL (ref 8.4–10.5)
CO2: 24 meq/L (ref 19–32)
Chloride: 100 mEq/L (ref 96–112)
Creatinine, Ser: 1.32 mg/dL — ABNORMAL HIGH (ref 0.40–1.20)
GFR: 40.6 mL/min — ABNORMAL LOW (ref >60.00)
GLUCOSE: 86 mg/dL (ref 70–99)
Potassium: 4.6 mEq/L (ref 3.5–5.1)
Sodium: 131 mEq/L — ABNORMAL LOW (ref 135–145)

## 2014-11-12 LAB — CBC WITH DIFFERENTIAL/PLATELET
BASOS ABS: 0 10*3/uL (ref 0.0–0.1)
Basophils Relative: 0.3 % (ref 0.0–3.0)
EOS ABS: 0.6 10*3/uL (ref 0.0–0.7)
EOS PCT: 6.2 % — AB (ref 0.0–5.0)
HCT: 40.1 % (ref 36.0–46.0)
Hemoglobin: 13.3 g/dL (ref 12.0–15.0)
Lymphocytes Relative: 19.8 % (ref 12.0–46.0)
Lymphs Abs: 2 10*3/uL (ref 0.7–4.0)
MCHC: 33.2 g/dL (ref 30.0–36.0)
MCV: 94.6 fl (ref 78.0–100.0)
MONO ABS: 0.9 10*3/uL (ref 0.1–1.0)
Monocytes Relative: 8.5 % (ref 3.0–12.0)
NEUTROS PCT: 65.2 % (ref 43.0–77.0)
Neutro Abs: 6.6 10*3/uL (ref 1.4–7.7)
PLATELETS: 393 10*3/uL (ref 150.0–400.0)
RBC: 4.24 Mil/uL (ref 3.87–5.11)
RDW: 14.1 % (ref 11.5–15.5)
WBC: 10.1 10*3/uL (ref 4.0–10.5)

## 2014-11-12 LAB — HEPATIC FUNCTION PANEL
ALK PHOS: 65 U/L (ref 39–117)
ALT: 9 U/L (ref 0–35)
AST: 15 U/L (ref 0–37)
Albumin: 4.2 g/dL (ref 3.5–5.2)
BILIRUBIN DIRECT: 0.2 mg/dL (ref 0.0–0.3)
BILIRUBIN TOTAL: 0.8 mg/dL (ref 0.2–1.2)
Total Protein: 7.3 g/dL (ref 6.0–8.3)

## 2014-11-12 LAB — MAGNESIUM: Magnesium: 2.1 mg/dL (ref 1.5–2.5)

## 2014-11-12 LAB — TSH: TSH: 1.91 u[IU]/mL (ref 0.35–4.50)

## 2014-11-12 MED ORDER — LISINOPRIL 10 MG PO TABS: ORAL_TABLET | ORAL | Status: DC

## 2014-11-12 NOTE — Progress Notes (Signed)
Patient ID: Allison Sellers, female    DOB: [birth year]  Age: 81 y.o. MRN: [id number]    Subjective:  Subjective HPI Allison Sellers presents for hosp f/u for rectal bleed. She was d/c on 6/8.   Review of Systems  Constitutional: Negative for activity change, appetite change, fatigue and unexpected weight change.  Respiratory: Negative for cough and shortness of breath.   Cardiovascular: Negative for chest pain and palpitations.  Psychiatric/Behavioral: Negative for behavioral problems and dysphoric mood. The patient is not nervous/anxious.     History Past Medical History  Diagnosis Date  . Hypertension   . Osteopenia   . Atrial fibrillation   . Seborrheic keratosis 06/2014    of hand.     She has past surgical history that includes Umbilical hernia repair (? 2013); Cardioversion (04/18/2011); Lexiscan Myoview (04/14/2011); 2D Echocardiogram (03/29/2011); and Colonoscopy (N/A, 11/03/2014).   Her family history includes Arthritis in an other family member; Heart failure in her father and mother; Macular degeneration in her mother.She reports that she has never smoked. She has never used smokeless tobacco. She reports that she does not drink alcohol or use illicit drugs.  Current Outpatient Prescriptions on File Prior to Visit  Medication Sig Dispense Refill  . dronedarone (MULTAQ) 400 MG tablet Take 1 tablet (400 mg total) by mouth 2 (two) times daily with a meal. 180 tablet 3  . metoprolol tartrate (LOPRESSOR) 25 MG tablet Take 0.5 tablets (12.5 mg total) by mouth 2 (two) times daily. 90 tablet 3  . nitroGLYCERIN (NITROSTAT) 0.4 MG SL tablet Place 1 tablet (0.4 mg total) under the tongue every 5 (five) minutes as needed. For chest pain 25 tablet 6  . Vitamin D, Ergocalciferol, (DRISDOL) 50000 UNITS CAPS capsule Take 50,000 Units by mouth every 7 (seven) days. Wednesday    . XARELTO 15 MG TABS tablet Take 1 tablet by mouth daily.    . [DISCONTINUED] digoxin (LANOXIN) 0.125 MG tablet Take  125 mcg by mouth daily.       No current facility-administered medications on file prior to visit.     Objective:  Objective Physical Exam  Constitutional: She is oriented to person, place, and time. She appears well-developed and well-nourished.  HENT:  Head: Normocephalic and atraumatic.  Eyes: Conjunctivae and EOM are normal.  Neck: Normal range of motion. Neck supple. No JVD present. Carotid bruit is not present. No thyromegaly present.  Cardiovascular: Normal rate, regular rhythm and normal heart sounds.   No murmur heard. Pulmonary/Chest: Effort normal and breath sounds normal. No respiratory distress. She has no wheezes. She has no rales. She exhibits no tenderness.  Musculoskeletal: She exhibits no edema.  Neurological: She is alert and oriented to person, place, and time.  Psychiatric: She has a normal mood and affect. Her behavior is normal.   BP 124/80 mmHg  Pulse 66  Temp(Src) 98.2 F (36.8 C) (Oral)  Wt 133 lb 12.8 oz (60.691 kg)  SpO2 98% Wt Readings from Last 3 Encounters:  11/12/14 133 lb 12.8 oz (60.691 kg)  11/04/14 134 lb 14.7 oz (61.2 kg)  09/24/14 135 lb 3.2 oz (61.326 kg)     Lab Results  Component Value Date   WBC 10.1 11/12/2014   HGB 13.3 11/12/2014   HCT 40.1 11/12/2014   PLT 393.0 11/12/2014   GLUCOSE 86 11/12/2014   CHOL 127 04/21/2014   TRIG 97.0 04/21/2014   HDL 52.80 04/21/2014   LDLCALC 55 04/21/2014   ALT 9 11/12/2014  AST 15 11/12/2014   NA 131* 11/12/2014   K 4.6 11/12/2014   CL 100 11/12/2014   CREATININE 1.32* 11/12/2014   BUN 20 11/12/2014   CO2 24 11/12/2014   TSH 1.91 11/12/2014   INR 1.20 11/03/2014   HGBA1C 5.3 05/19/2014    No results found.   Assessment & Plan:  Plan I have changed Ms. Tener's lisinopril. I am also having her maintain her nitroGLYCERIN, dronedarone, metoprolol tartrate, Vitamin D (Ergocalciferol), and XARELTO.  Meds ordered this encounter  Medications  . lisinopril (PRINIVIL,ZESTRIL) 10 MG  tablet    Sig: 1 po qd    Dispense:  90 tablet    Refill:  3    Problem List Items Addressed This Visit    Hyponatremia - Primary    Recheck labs If still low-- check urine studies and liberalize diet      Relevant Orders   Magnesium (Completed)   Basic metabolic panel (Completed)   CBC with Differential/Platelet (Completed)   Hepatic function panel (Completed)   TSH (Completed)   GI bleed    Diverticulosis on colon xaralto restarted and pt has not seen any blood since Check labs today GI f/u prn      Essential hypertension (Chronic)   Relevant Medications   lisinopril (PRINIVIL,ZESTRIL) 10 MG tablet    Other Visit Diagnoses    History of GI bleed           Follow-up: Return in about 6 months (around 05/14/2015), or if symptoms worsen or fail to improve.  Allison Koyanagi, DO

## 2014-11-12 NOTE — Patient Instructions (Signed)

## 2014-11-12 NOTE — Progress Notes (Signed)
Pre visit review using our clinic review tool, if applicable. No additional management support is needed unless otherwise documented below in the visit note. 

## 2014-11-16 DIAGNOSIS — E871 Hypo-osmolality and hyponatremia: Secondary | ICD-10-CM | POA: Insufficient documentation

## 2014-11-16 NOTE — Assessment & Plan Note (Signed)
Diverticulosis on colon xaralto restarted and pt has not seen any blood since Check labs today GI f/u prn

## 2014-11-16 NOTE — Assessment & Plan Note (Signed)
Recheck labs If still low-- check urine studies and liberalize diet

## 2014-11-20 ENCOUNTER — Encounter (HOSPITAL_COMMUNITY): Payer: Self-pay | Admitting: Anesthesiology

## 2014-11-20 ENCOUNTER — Encounter (HOSPITAL_COMMUNITY)
Admission: EM | Disposition: A | Discharge status: Home / Self Care | Payer: Self-pay | Source: Home / Self Care | Attending: Emergency Medicine

## 2014-11-20 ENCOUNTER — Emergency Department (HOSPITAL_COMMUNITY)
Admission: EM | Admit: 2014-11-20 | Discharge: 2014-11-20 | Disposition: A | Discharge status: Home / Self Care | Payer: Medicare Other | Attending: Emergency Medicine | Admitting: Emergency Medicine

## 2014-11-20 ENCOUNTER — Encounter (HOSPITAL_COMMUNITY): Payer: Self-pay

## 2014-11-20 ENCOUNTER — Emergency Department (HOSPITAL_COMMUNITY): Payer: Medicare Other

## 2014-11-20 ENCOUNTER — Emergency Department (HOSPITAL_COMMUNITY): Payer: Medicare Other | Admitting: Anesthesiology

## 2014-11-20 ENCOUNTER — Telehealth: Payer: Self-pay | Admitting: Cardiology

## 2014-11-20 DIAGNOSIS — Z8739 Personal history of other diseases of the musculoskeletal system and connective tissue: Secondary | ICD-10-CM | POA: Insufficient documentation

## 2014-11-20 DIAGNOSIS — I10 Essential (primary) hypertension: Secondary | ICD-10-CM | POA: Diagnosis not present

## 2014-11-20 DIAGNOSIS — I5032 Chronic diastolic (congestive) heart failure: Secondary | ICD-10-CM

## 2014-11-20 DIAGNOSIS — Z7901 Long term (current) use of anticoagulants: Secondary | ICD-10-CM | POA: Insufficient documentation

## 2014-11-20 DIAGNOSIS — N183 Chronic kidney disease, stage 3 (moderate): Secondary | ICD-10-CM | POA: Diagnosis not present

## 2014-11-20 DIAGNOSIS — R079 Chest pain, unspecified: Secondary | ICD-10-CM | POA: Diagnosis present

## 2014-11-20 DIAGNOSIS — Z872 Personal history of diseases of the skin and subcutaneous tissue: Secondary | ICD-10-CM | POA: Diagnosis not present

## 2014-11-20 DIAGNOSIS — I480 Paroxysmal atrial fibrillation: Secondary | ICD-10-CM | POA: Diagnosis not present

## 2014-11-20 DIAGNOSIS — Z0189 Encounter for other specified special examinations: Secondary | ICD-10-CM

## 2014-11-20 DIAGNOSIS — Z79899 Other long term (current) drug therapy: Secondary | ICD-10-CM | POA: Diagnosis not present

## 2014-11-20 DIAGNOSIS — I4891 Unspecified atrial fibrillation: Secondary | ICD-10-CM | POA: Insufficient documentation

## 2014-11-20 DIAGNOSIS — N179 Acute kidney failure, unspecified: Secondary | ICD-10-CM | POA: Diagnosis not present

## 2014-11-20 DIAGNOSIS — Z418 Encounter for other procedures for purposes other than remedying health state: Secondary | ICD-10-CM | POA: Diagnosis not present

## 2014-11-20 HISTORY — PX: CARDIOVERSION: SHX1299

## 2014-11-20 LAB — CBC
HCT: 40.9 % (ref 36.0–46.0)
Hemoglobin: 13.8 g/dL (ref 12.0–15.0)
MCH: 31.4 pg (ref 26.0–34.0)
MCHC: 33.7 g/dL (ref 30.0–36.0)
MCV: 93.2 fL (ref 78.0–100.0)
Platelets: 386 10*3/uL (ref 150–400)
RBC: 4.39 MIL/uL (ref 3.87–5.11)
RDW: 13.4 % (ref 11.5–15.5)
WBC: 9.8 10*3/uL (ref 4.0–10.5)

## 2014-11-20 LAB — BASIC METABOLIC PANEL
Anion gap: 7 (ref 5–15)
BUN: 24 mg/dL — ABNORMAL HIGH (ref 6–20)
CHLORIDE: 103 mmol/L (ref 101–111)
CO2: 24 mmol/L (ref 22–32)
Calcium: 9 mg/dL (ref 8.9–10.3)
Creatinine, Ser: 1.5 mg/dL — ABNORMAL HIGH (ref 0.44–1.00)
GFR, EST AFRICAN AMERICAN: 35 mL/min — AB (ref >60)
GFR, EST NON AFRICAN AMERICAN: 31 mL/min — AB (ref >60)
Glucose, Bld: 110 mg/dL — ABNORMAL HIGH (ref 65–99)
POTASSIUM: 5 mmol/L (ref 3.5–5.1)
SODIUM: 134 mmol/L — AB (ref 135–145)

## 2014-11-20 LAB — I-STAT TROPONIN, ED: TROPONIN I, POC: 0 ng/mL (ref 0.00–0.08)

## 2014-11-20 SURGERY — CARDIOVERSION
Anesthesia: General

## 2014-11-20 MED ORDER — SODIUM CHLORIDE 0.9 % IV SOLN
INTRAVENOUS | Status: DC
  Administered 2014-11-20: 16:00:00 via INTRAVENOUS

## 2014-11-20 MED ORDER — ESMOLOL HCL-SODIUM CHLORIDE 2000 MG/100ML IV SOLN
25.0000 ug/kg/min | Freq: Once | INTRAVENOUS | Status: DC
  Filled 2014-11-20: qty 100

## 2014-11-20 MED ORDER — DEXTROSE 5 % IV SOLN
5.0000 mg/h | Freq: Once | INTRAVENOUS | Status: AC
  Administered 2014-11-20: 5 mg/h via INTRAVENOUS

## 2014-11-20 MED ORDER — LABETALOL HCL 5 MG/ML IV SOLN
20.0000 mg | Freq: Once | INTRAVENOUS | Status: AC
  Administered 2014-11-20: 20 mg via INTRAVENOUS
  Filled 2014-11-20: qty 4

## 2014-11-20 MED ORDER — PROPOFOL 10 MG/ML IV BOLUS
INTRAVENOUS | Status: DC | PRN
  Administered 2014-11-20: 50 mg via INTRAVENOUS

## 2014-11-20 MED ORDER — SODIUM CHLORIDE 0.9 % IV BOLUS (SEPSIS)
500.0000 mL | Freq: Once | INTRAVENOUS | Status: AC
  Administered 2014-11-20: 500 mL via INTRAVENOUS

## 2014-11-20 MED ORDER — LIDOCAINE HCL (CARDIAC) 20 MG/ML IV SOLN
INTRAVENOUS | Status: DC | PRN
  Administered 2014-11-20: 40 mg via INTRAVENOUS

## 2014-11-20 NOTE — ED Notes (Addendum)
Pt states she started having cp last night but denies any at present. Felt lightheaded last night and felt better when she lied down. Takes xarelto for afib.

## 2014-11-20 NOTE — Telephone Encounter (Signed)
Received a call from patient's daughter Manuela Schwartz she stated mother woke up this morning with chest pain,dizzy,jittery.Stated pulse 125, B/P 118/91.Stated she just got to mother's home and she continues to have a # 5 chest pain.Pulse 107 B/P 113/89.Advised to go to [facility] ER.Trish called.

## 2014-11-20 NOTE — Consult Note (Signed)
Patient ID: Allison Sellers MRN: [id number], DOB/AGE: 79-May-1928   Admit date: 11/20/2014   Primary Physician: Garnet Koyanagi, DO Primary Cardiologist: Dr. Ellyn Hack  Pt. Profile:  Pleasant 81 year old Caucasian female with past medical history of hypertension, history of paroxysmal atrial fibrillation controlled on Xarelto and multaq, history of chronic diastolic heart failure not on diuretic, stage III CKD present with dizziness, weakness, epigastric pain and found to have a-fib with RVR  Problem List  Past Medical History  Diagnosis Date  . Hypertension   . Osteopenia   . Atrial fibrillation   . Seborrheic keratosis 06/2014    of hand.     Past Surgical History  Procedure Laterality Date  . Umbilical hernia repair  ? 2013  . Cardioversion  04/18/2011    Procedure: CARDIOVERSION;  Surgeon: Leonie Man;  Location: [facility] OR;  Service: Cardiovascular;  Laterality: N/A;  . Lexiscan myoview  04/14/2011    No scintigraphic evidence of inducible myocardial ischemia, EKG negative for ischemia, patient developed Atrial Fibrillation with rapid ventricular response during stress and persisted in the recovery period, abnormal myocardial perfusion study  . 2d echocardiogram  03/29/2011    EF 55-65%, normal  . Colonoscopy N/A 11/03/2014    Procedure: COLONOSCOPY;  Surgeon: Ladene Artist, MD;  Location: [facility] ENDOSCOPY;  Service: Endoscopy;  Laterality: N/A;     Allergies  Allergies  Allergen Reactions  . Ciprofloxacin Other (See Comments)    Cant be given with her multaq    HPI  The patient is a pleasant 81 year old Caucasian female with PMH of HTN, history of PAF on Xarelto and multaq, history of chronic diastolic heart failure not on diuretic, stage III CKD. Her last echocardiogram in October 2012 showed normal EF 55-65%. She had a negative Myoview on 04/14/2011. Her last DC cardioversion was also in November 2012. Patient has been doing very well at home with her atrial fibrillation  controlled on metoprolol 25 mg twice a day and multaq. She was last seen by Dr. Ellyn Hack in the office in April 2016, at which time her metoprolol was decreased to 12.5 mg twice a day due to fatigue. She was admitted to [facility] on 11/01/2014 for GI bleed with bright red blood per rectum. Her Xarelto was held for roughly one week. Gastroenterology was consulted and she underwent a colonoscopy on 11/03/2014 which showed moderate diverticulosis in transverse colon and severe diverticulosis in sigmoid colon which is likely responsible for GI bleed. She was instructed to resume Xarelto after 5 days. Her last EKG was on 6/5 which showed she was still in sinus rhythm.  Since her recent discharge, she has not been restarted on her daily exercise regimen with treadmill. However, she does climb stairs every day at home and denies any exertional dyspnea or chest pain. She began to have weakness, dizziness around 5 PM on the night of 11/19/2014. There was no palpitation or fluttering sensation as patient does not have any cardiac awareness of a-fib. The symptom persisted overnight. Her daughter measured her blood pressure in the morning of 6/24 and noted she was having fast heart rate. She also had  epigastric pain this morning which resolved after hour. She states the epigastric pain is consistent with her acid reflux. She was eventually taken to [facility] for further evaluation. On arrival, she was noted to be in atrial fibrillation with RVR. Significant laboratory finding include sodium 134, Cr 1.5, normal CBC, negative troponin. IV diltiazem has been started.  Cardiology has been consulted for atrial fibrillation with RVR.   Home Medications  Prior to Admission medications   Medication Sig Start Date End Date Taking? Authorizing Provider  dronedarone (MULTAQ) 400 MG tablet Take 1 tablet (400 mg total) by mouth 2 (two) times daily with a meal. 09/24/14   Leonie Man, MD  lisinopril  (PRINIVIL,ZESTRIL) 10 MG tablet 1 po qd 11/12/14   Rosalita Chessman, DO  metoprolol tartrate (LOPRESSOR) 25 MG tablet Take 0.5 tablets (12.5 mg total) by mouth 2 (two) times daily. 09/24/14   Leonie Man, MD  nitroGLYCERIN (NITROSTAT) 0.4 MG SL tablet Place 1 tablet (0.4 mg total) under the tongue every 5 (five) minutes as needed. For chest pain 09/19/13   Leonie Man, MD  Vitamin D, Ergocalciferol, (DRISDOL) 50000 UNITS CAPS capsule Take 50,000 Units by mouth every 7 (seven) days. Wednesday    Historical Provider, MD  XARELTO 15 MG TABS tablet Take 1 tablet by mouth daily. 09/24/14   Historical Provider, MD    Family History  Family History  Problem Relation Age of Onset  . Arthritis    . Heart failure Father     CHF  . Heart failure Mother     CHF  . Macular degeneration Mother     Social History  History   Social History  . Marital Status: Widowed    Spouse Name: N/A  . Number of Children: N/A  . Years of Education: N/A   Occupational History  . Not on file.   Social History Main Topics  . Smoking status: Never Smoker   . Smokeless tobacco: Never Used  . Alcohol Use: No  . Drug Use: No  . Sexual Activity: Not Currently   Other Topics Concern  . Not on file   Social History Narrative   She is a widowed mother of 26, grandmother of 56, great grandmother of 1. She is usually very active and likes to walk .    recently.    She quit smoking 50 years ago. She denies any alcohol.    She says in order to try to make up for the fact she has not been doing much exercise she has been walking up and down the stairs at the house and staying as active as possible, just doing whatever activity she can do.      Review of Systems General:  No chills, fever, night sweats or weight changes.  Cardiovascular:  No chest pain, dyspnea on exertion, edema, orthopnea, palpitations, paroxysmal nocturnal dyspnea. Dermatological: No rash, lesions/masses Respiratory: No cough,  dyspnea Urologic: No hematuria, dysuria Abdominal:   No nausea, vomiting, diarrhea, bright red blood per rectum, melena, or hematemesis +no GI bleed since 2 wks ago. Epigastric pain this morning, resolved after a hr Neurologic:  No visual changes, changes in mental status. Weakness and dizziness All other systems reviewed and are otherwise negative except as noted above.  Physical Exam  Blood pressure 103/71, pulse 126, temperature 98 F (36.7 C), temperature source Oral, resp. rate 25, height 5' (1.524 m), weight 134 lb 12.8 oz (61.145 kg), SpO2 98 %.  General: Pleasant, NAD Psych: Normal affect. Neuro: Alert and oriented X 3. Moves all extremities spontaneously. HEENT: Normal  Neck: Supple without bruits or JVD. Lungs:  Resp regular and unlabored. Bibasilar fine crackle. Heart: tachycardic. no s3, s4, or murmurs. Abdomen: Soft, non-tender, non-distended, BS + x 4.  Extremities: No clubbing, cyanosis or edema. DP/PT/Radials 2+ and equal bilaterally.  Labs  Troponin [facility] of Care Test)  Recent Labs  11/20/14 1240  TROPIPOC 0.00   No results for input(s): CKTOTAL, CKMB, TROPONINI in the last 72 hours. Lab Results  Component Value Date   WBC 9.8 11/20/2014   HGB 13.8 11/20/2014   HCT 40.9 11/20/2014   MCV 93.2 11/20/2014   PLT 386 11/20/2014    Recent Labs Lab 11/20/14 1224  NA 134*  K 5.0  CL 103  CO2 24  BUN 24*  CREATININE 1.50*  CALCIUM 9.0  GLUCOSE 110*   Lab Results  Component Value Date   CHOL 127 04/21/2014   HDL 52.80 04/21/2014   LDLCALC 55 04/21/2014   TRIG 97.0 04/21/2014   No results found for: DDIMER   Radiology/Studies  Dg Chest 2 View  11/20/2014   CLINICAL DATA:  Tachycardia. History of atrial fibrillation. Chest pain.  EXAM: CHEST  2 VIEW  COMPARISON:  03/28/2011 and 03/27/2011  FINDINGS: Again noted are prominent interstitial lung markings particularly along the periphery. There may be progression of disease in the upper lungs compared  to the prior examinations. Heart size is normal. Atherosclerotic calcifications at the aortic arch. No large pleural effusions. No acute bone abnormality.  IMPRESSION: Prominent lung markings are compatible with chronic interstitial lung disease. Concern for progression of disease in the upper lungs.  No large areas of airspace disease or consolidation.   Electronically Signed   By: Markus Daft M.D.   On: 11/20/2014 13:17    ECG  A-fib with RVR  Echocardiogram 03/29/2011  Normal EF    ASSESSMENT AND PLAN  1. PAF with RVR  - CHA2DS2-VASC score 4 (HTN, female, age), on multaq  - on Xarelto with recent interruption between 6/5 until 6/13, symptom started around 6/23 with dizziness, no cardiac awareness of palpitation  - metoprolol recently decreased to 12.47m BID from 218mBID in Apr 2016  - given likely onset yesterday, MD planning for DCCV  2. HTN 3. Acute on chronic renal insufficiency stage III, trend Cr, fluid hydration once HR under control to avoid HF 4. Chronic diastolic HF: some bibasilar rale on exam, CXR shows interstitial lung dx progression    Signed, MeAlmyra DeforestPA-C 11/20/2014, 3:13 PM  Patient seen and examined with HaAlmyra DeforestPA-C. We discussed all aspects of the encounter. I agree with the assessment and plan as stated above.  Delightful 89/o with PAF maintained in NSR on Multaq. Now with first episode of breakthrough AF - with onset lst night at 5 pm. Was on Xarelto last week for diverticular bleed but now back on without recurrent bleeding. Discussed options of watchful waiting vs DC-CV in ER with her and her daughters. We will proceed with DC-CV in ER.   Bensimhon, Daniel,MD 5:09 PM

## 2014-11-20 NOTE — ED Notes (Signed)
Cardioversion completed by MD Bensimon, pt in NSR, CRNA remains at bedside

## 2014-11-20 NOTE — ED Notes (Addendum)
Pt ambulated with steady gait. Pt denies dizziness or lightheadedness. Aline Brochure, MD notified.

## 2014-11-20 NOTE — Discharge Instructions (Signed)

## 2014-11-20 NOTE — ED Notes (Signed)
Pt waiting for anesthesia for cardioversion

## 2014-11-20 NOTE — Telephone Encounter (Signed)
Pt BP was 118/91 and heart rate was 125,now it is 113/89 and heart rate is 107. Pt feels lightheaded and a little pain right below her Stereum.

## 2014-11-20 NOTE — ED Provider Notes (Signed)
8:53 PM patient is status post cardioversion. Cleared by cardiology for discharge home. She is ambulatory and continues to appear well.  Clinical Impression 1. Atrial fibrillation with RVR   2. Encounter for cardioversion procedure      Pamella Pert, MD 11/20/14 2053

## 2014-11-20 NOTE — CV Procedure (Signed)
     DIRECT CURRENT CARDIOVERSION  NAME:  Allison Sellers   MRN: [id number] DOB:  02-27-1927   ADMIT DATE: 11/20/2014   INDICATIONS: Atrial fibrillation    PROCEDURE:   Informed consent was obtained prior to the procedure. The risks, benefits and alternatives for the procedure were discussed and the patient comprehended these risks. Once an appropriate time out was taken, the patient had the defibrillator pads placed in the anterior and posterior position. The patient then underwent sedation by the anesthesia service. Once an appropriate level of sedation was achieved, the patient received a single biphasic, synchronized 150J shock with prompt conversion to sinus rhythm. No apparent complications.  Jeter Tomey,MD 7:54 PM

## 2014-11-20 NOTE — ED Provider Notes (Signed)
CSN: [id number]     Arrival date & time 11/20/14  1203 History   First MD Initiated Contact with Patient 11/20/14 1232     Chief Complaint  Patient presents with  . Chest Pain    HPI  Patient presents with concern of lightheadedness, palpitations. Symptoms began seemingly earlier today, though exact time of onset is unclear. Patient has had prior episode, though not as sustained as today's. She denies headache, nausea, vomiting, visual changes, syncope. No clear precipitant. Since onset no clear alleviating or exacerbating factors. Patient has hospital physician several weeks ago, with change in lisinopril, metoprolol dosing. No medication changes within the past week, however. No current fever, chills, weight loss, weight gain, swelling.   Past Medical History  Diagnosis Date  . Hypertension   . Osteopenia   . Atrial fibrillation   . Seborrheic keratosis 06/2014    of hand.    Past Surgical History  Procedure Laterality Date  . Umbilical hernia repair  ? 2013  . Cardioversion  04/18/2011    Procedure: CARDIOVERSION;  Surgeon: Leonie Man;  Location: [facility] OR;  Service: Cardiovascular;  Laterality: N/A;  . Lexiscan myoview  04/14/2011    No scintigraphic evidence of inducible myocardial ischemia, EKG negative for ischemia, patient developed Atrial Fibrillation with rapid ventricular response during stress and persisted in the recovery period, abnormal myocardial perfusion study  . 2d echocardiogram  03/29/2011    EF 55-65%, normal  . Colonoscopy N/A 11/03/2014    Procedure: COLONOSCOPY;  Surgeon: Ladene Artist, MD;  Location: [facility] ENDOSCOPY;  Service: Endoscopy;  Laterality: N/A;   Family History  Problem Relation Age of Onset  . Arthritis    . Heart failure Father     CHF  . Heart failure Mother     CHF  . Macular degeneration Mother    History  Substance Use Topics  . Smoking status: Never Smoker   . Smokeless tobacco: Never Used  . Alcohol Use: No   OB History     No data available     Review of Systems  Constitutional:       Per HPI, otherwise negative  HENT:       Per HPI, otherwise negative  Respiratory:       Per HPI, otherwise negative  Cardiovascular:       Per HPI, otherwise negative  Gastrointestinal: Negative for vomiting.  Endocrine:       Negative aside from HPI  Genitourinary:       Neg aside from HPI   Musculoskeletal:       Per HPI, otherwise negative  Skin: Negative.   Neurological: Negative for syncope.      Allergies  Ciprofloxacin  Home Medications   Prior to Admission medications   Medication Sig Start Date End Date Taking? Authorizing Provider  dronedarone (MULTAQ) 400 MG tablet Take 1 tablet (400 mg total) by mouth 2 (two) times daily with a meal. 09/24/14   Leonie Man, MD  lisinopril (PRINIVIL,ZESTRIL) 10 MG tablet 1 po qd 11/12/14   Rosalita Chessman, DO  metoprolol tartrate (LOPRESSOR) 25 MG tablet Take 0.5 tablets (12.5 mg total) by mouth 2 (two) times daily. 09/24/14   Leonie Man, MD  nitroGLYCERIN (NITROSTAT) 0.4 MG SL tablet Place 1 tablet (0.4 mg total) under the tongue every 5 (five) minutes as needed. For chest pain 09/19/13   Leonie Man, MD  Vitamin D, Ergocalciferol, (DRISDOL) 50000 UNITS CAPS capsule Take 50,000 Units by  mouth every 7 (seven) days. Wednesday    Historical Provider, MD  XARELTO 15 MG TABS tablet Take 1 tablet by mouth daily. 09/24/14   Historical Provider, MD   BP 139/103 mmHg  Pulse 54  Temp(Src) 98 F (36.7 C) (Oral)  Resp 19  Ht 5' (1.524 m)  Wt 134 lb 12.8 oz (61.145 kg)  BMI 26.33 kg/m2  SpO2 99% Physical Exam  Constitutional: She is oriented to person, place, and time. She appears well-developed and well-nourished. No distress.  HENT:  Head: Normocephalic and atraumatic.  Eyes: Conjunctivae and EOM are normal.  Cardiovascular: Regular rhythm.  Tachycardia present.   Pulmonary/Chest: Effort normal and breath sounds normal. No stridor. No respiratory distress.   Abdominal: She exhibits no distension.  Musculoskeletal: She exhibits no edema.  Neurological: She is alert and oriented to person, place, and time. No cranial nerve deficit.  Skin: Skin is warm and dry.  Psychiatric: She has a normal mood and affect.  Nursing note and vitals reviewed.   ED Course  Procedures (including critical care time) Labs Review Labs Reviewed  BASIC METABOLIC PANEL - Abnormal; Notable for the following:    Sodium 134 (*)    Glucose, Bld 110 (*)    BUN 24 (*)    Creatinine, Ser 1.50 (*)    GFR calc non Af Amer 31 (*)    GFR calc Af Amer 35 (*)    All other components within normal limits  CBC  I-STAT TROPOININ, ED    Imaging Review Dg Chest 2 View  11/20/2014   CLINICAL DATA:  Tachycardia. History of atrial fibrillation. Chest pain.  EXAM: CHEST  2 VIEW  COMPARISON:  03/28/2011 and 03/27/2011  FINDINGS: Again noted are prominent interstitial lung markings particularly along the periphery. There may be progression of disease in the upper lungs compared to the prior examinations. Heart size is normal. Atherosclerotic calcifications at the aortic arch. No large pleural effusions. No acute bone abnormality.  IMPRESSION: Prominent lung markings are compatible with chronic interstitial lung disease. Concern for progression of disease in the upper lungs.  No large areas of airspace disease or consolidation.   Electronically Signed   By: Markus Daft M.D.   On: 11/20/2014 13:17     EKG Interpretation   Date/Time:  Friday November 20 2014 12:24:28 EDT Ventricular Rate:  126 PR Interval:    QRS Duration: 86 QT Interval:  296 QTC Calculation: 428 R Axis:   65 Text Interpretation:  Atrial flutter with variable A-V block Nonspecific  ST and T wave abnormality Abnormal ECG afib versus a flutter ST-t wave  abnormality Abnormal ekg Confirmed by Carmin Muskrat  MD ([id number]) on  11/20/2014 12:33:35 PM       2:16 PM Following initial labetalol HR remains >120  Update:  Following the second dose of intravenous labetalol, patient heart rate remained greater than 120, but blood pressure decreased to 408 systolic.  Given the patient's intolerance of additional beta blocker therapy, beyond her home medication, calcium channel blocker continuous, will be started to control A. fib, rapid ventricular response.   MDM  Patient with a history of paroxysmal atrial fibrillation, appropriately anticoagulated, on oral agents for rhythm and rate control now presents with atrial fibrillation, rapid ventricular response. Initial attempts to control rate were unsuccessful, patient's elevated creatinine was addressed with IV fluids, but with no substantial reduction in her arrhythmia. Patient was started on IV diltiazem, and after discussion with our cardiology colleagues, she was admitted for  further evaluation and management.  CRITICAL CARE Performed by: Carmin Muskrat Total critical care time: 45 Critical care time was exclusive of separately billable procedures and treating other patients. Critical care was necessary to treat or prevent imminent or life-threatening deterioration. Critical care was time spent personally by me on the following activities: development of treatment plan with patient and/or surrogate as well as nursing, discussions with consultants, evaluation of patient's response to treatment, examination of patient, obtaining history from patient or surrogate, ordering and performing treatments and interventions, ordering and review of laboratory studies, ordering and review of radiographic studies, pulse oximetry and re-evaluation of patient's condition.   Late addendum: Following hours of titrated calcium channel blocker, without cardioversion, cardiology service evaluated the patient, performed elective cardioversion. This was well tolerated, and the patient was discharged.   Carmin Muskrat, MD 11/21/14 1556

## 2014-11-20 NOTE — Anesthesia Preprocedure Evaluation (Addendum)
Anesthesia Evaluation  Patient identified by MRN, date of birth, ID band Patient awake    Reviewed: Allergy & Precautions, H&P , NPO status , Patient's Chart, lab work & pertinent test results, reviewed documented beta blocker date and time   Airway Mallampati: I  TM Distance: >3 FB Neck ROM: Full    Dental no notable dental hx. (+) Teeth Intact, Dental Advisory Given   Pulmonary neg pulmonary ROS,  breath sounds clear to auscultation  Pulmonary exam normal       Cardiovascular hypertension, Pt. on medications and Pt. on home beta blockers + dysrhythmias Atrial Fibrillation Rhythm:Irregular Rate:Tachycardia - Systolic murmurs    Neuro/Psych negative neurological ROS  negative psych ROS   GI/Hepatic negative GI ROS, Neg liver ROS,   Endo/Other  negative endocrine ROS  Renal/GU Renal disease  negative genitourinary   Musculoskeletal   Abdominal   Peds  Hematology negative hematology ROS (+)   Anesthesia Other Findings   Reproductive/Obstetrics negative OB ROS                           Anesthesia Physical Anesthesia Plan  ASA: III  Anesthesia Plan: General   Post-op Pain Management:    Induction: Intravenous  Airway Management Planned: Mask  Additional Equipment:   Intra-op Plan:   Post-operative Plan:   Informed Consent: I have reviewed the patients History and Physical, chart, labs and discussed the procedure including the risks, benefits and alternatives for the proposed anesthesia with the patient or authorized representative who has indicated his/her understanding and acceptance.   Dental advisory given  Plan Discussed with: CRNA  Anesthesia Plan Comments:         Anesthesia Quick Evaluation

## 2014-11-20 NOTE — Transfer of Care (Signed)
Immediate Anesthesia Transfer of Care Note  Patient: Allison Sellers  Procedure(s) Performed: Procedure(s): CARDIOVERSION (N/A)  Patient Location: ED  Anesthesia Type:General  Level of Consciousness: awake, alert  and oriented  Airway & Oxygen Therapy: Patient connected to nasal cannula oxygen  Post-op Assessment: Report given to RN and Post -op Vital signs reviewed and stable  Post vital signs: Reviewed and stable  Last Vitals:  Filed Vitals:   11/20/14 1900  BP: 122/72  Pulse: 45  Temp:   Resp: 21    Complications: No apparent anesthesia complications

## 2014-11-20 NOTE — ED Notes (Signed)
Cardiology at bedside.

## 2014-11-20 NOTE — ED Notes (Signed)
MD Bensimon at bedside with CRNA, pt on zoll, on monitor

## 2014-11-20 NOTE — ED Notes (Signed)
CRNA at bedside, preparing for sedation

## 2014-11-20 NOTE — Anesthesia Postprocedure Evaluation (Signed)
  Anesthesia Post-op Note  Patient: Allison Sellers  Procedure(s) Performed: Procedure(s): CARDIOVERSION (N/A)  Patient Location: PACU  Anesthesia Type:General  Level of Consciousness: awake and alert   Airway and Oxygen Therapy: Patient Spontanous Breathing  Post-op Pain: none  Post-op Assessment: Post-op Vital signs reviewed, Patient's Cardiovascular Status Stable and Respiratory Function Stable  Post-op Vital Signs: Reviewed  Filed Vitals:   11/20/14 1945  BP: 102/59  Pulse: 73  Temp:   Resp: 28    Complications: No apparent anesthesia complications

## 2014-11-23 ENCOUNTER — Encounter (HOSPITAL_COMMUNITY): Payer: Self-pay | Admitting: Internal Medicine

## 2014-11-23 DIAGNOSIS — N183 Chronic kidney disease, stage 3 unspecified: Secondary | ICD-10-CM | POA: Insufficient documentation

## 2014-11-23 DIAGNOSIS — I4821 Permanent atrial fibrillation: Secondary | ICD-10-CM | POA: Insufficient documentation

## 2014-11-23 DIAGNOSIS — I5032 Chronic diastolic (congestive) heart failure: Secondary | ICD-10-CM | POA: Insufficient documentation

## 2014-11-23 NOTE — Progress Notes (Addendum)
Cardiology Office Note Date:  11/24/2014  Patient ID:  Sellers, Allison 05-28-1927, MRN [id number] PCP:  Garnet Koyanagi, DO  Cardiologist:  Ellyn Hack   Chief Complaint: follow-up atrial fib  History of Present Illness: Allison Sellers is a 81 y.o. female with history of HTN, PAF (on Xarelto, Multaq), chronic diastolic CHF, CKD stage III, interstitial lung disease noted on remote CT in 02/2011 (prior to antiarrhythmic rx of afib) who presents for post-hospital follow-up. She was recently seen in the ER 11/20/14 with first episode of breakthrough atrial fib.   Regarding recent history, metoprolol was reduced 08/2014 due to fatigue. She did have recent interruption between 6/5-6/13 in Xarelto due to diverticular bleed but was back on it when she went back into atrial fib the evening prior to ER visit (6/24). The decision was made to proceed with DCCV in the ER which was successful. CBC was normal. BMET showed Cr 1.5 (baseline appears somewhere between 1.2-1.4). TSH normal 11/12/14.   She says she has felt great since the cardioversion - better than she has for a while. No CP, SOB, palpitations, weakness, falls, or recurrent bleeding. Her daughter does note that she's had a sporadic cough since before the afib started. [name] says this tends to occur mostly in the morning upon waking, like phlegm in her throat. No hemoptysis. Has h/o seasonal allergies. CXR on 11/20/14 did show chronic interstitial lung disease with concern for progression of disease in the upper lungs. POx 98% in clinic on RA. No fever or WBC recently. No changes in weight.   Past Medical History  Diagnosis Date  . Hypertension   . Osteopenia   . PAF (paroxysmal atrial fibrillation)   . Seborrheic keratosis 06/2014    of hand.   . Chronic diastolic CHF (congestive heart failure)   . CKD (chronic kidney disease), stage III   . Diverticular disease   . Interstitial lung disease     a. Noted on CT scan 02/2011 before initiation of  any antiarrhythmic meds.    Past Surgical History  Procedure Laterality Date  . Umbilical hernia repair  ? 2013  . Cardioversion  04/18/2011    Procedure: CARDIOVERSION;  Surgeon: Leonie Man;  Location: [facility] OR;  Service: Cardiovascular;  Laterality: N/A;  . Lexiscan myoview  04/14/2011    No scintigraphic evidence of inducible myocardial ischemia, EKG negative for ischemia, patient developed Atrial Fibrillation with rapid ventricular response during stress and persisted in the recovery period, abnormal myocardial perfusion study  . 2d echocardiogram  03/29/2011    EF 55-65%, normal  . Colonoscopy N/A 11/03/2014    Procedure: COLONOSCOPY;  Surgeon: Ladene Artist, MD;  Location: [facility] ENDOSCOPY;  Service: Endoscopy;  Laterality: N/A;  . Cardioversion N/A 11/20/2014    Procedure: CARDIOVERSION;  Surgeon: Jolaine Artist, MD;  Location: [facility] OR;  Service: Cardiovascular;  Laterality: N/A;    Current Outpatient Prescriptions  Medication Sig Dispense Refill  . acetaminophen (TYLENOL) 500 MG tablet Take 500 mg by mouth every 6 (six) hours as needed for headache.    . dronedarone (MULTAQ) 400 MG tablet Take 1 tablet (400 mg total) by mouth 2 (two) times daily with a meal. 180 tablet 3  . lisinopril (PRINIVIL,ZESTRIL) 10 MG tablet 1 po qd 90 tablet 3  . metoprolol tartrate (LOPRESSOR) 25 MG tablet Take 0.5 tablets (12.5 mg total) by mouth 2 (two) times daily. 90 tablet 3  . nitroGLYCERIN (NITROSTAT) 0.4 MG SL tablet Place 1  tablet (0.4 mg total) under the tongue every 5 (five) minutes as needed. For chest pain 25 tablet 6  . Vitamin D, Ergocalciferol, (DRISDOL) 50000 UNITS CAPS capsule Take 50,000 Units by mouth every 7 (seven) days. Wednesday    . XARELTO 15 MG TABS tablet Take 15 mg by mouth daily.     . [DISCONTINUED] digoxin (LANOXIN) 0.125 MG tablet Take 125 mcg by mouth daily.       No current facility-administered medications for this visit.    Allergies:   Aspirin and Ciprofloxacin    Social History:  The patient  reports that she has never smoked. She has never used smokeless tobacco. She reports that she does not drink alcohol or use illicit drugs.   Family History:  The patient's family history includes Arthritis in an other family member; Heart failure in her father and mother; Macular degeneration in her mother.   ROS:  Please see the history of present illness.  All other systems are reviewed and otherwise negative.   PHYSICAL EXAM:  VS:  BP 118/64 mmHg  Pulse 62  Ht 5\' 1"  (1.549 m)  Wt 134 lb (60.782 kg)  BMI 25.33 kg/m2  SpO2 98% BMI: Body mass index is 25.33 kg/(m^2). Well nourished, well developed WF, in no acute distress HEENT: normocephalic, atraumatic Neck: no JVD, carotid bruits or masses Cardiac:  normal S1, S2; RRR; no murmurs, rubs, or gallops Lungs:  Somewhat coarse but no wheezing, rhonchi or rales Abd: soft, nontender, no hepatomegaly, + BS MS: no deformity or atrophy Ext: no edema Skin: warm and dry, no rash Neuro:  moves all extremities spontaneously, no focal abnormalities noted, follows commands Psych: euthymic mood, full affect   EKG:  Done today shows NSR 62bpm, no acute ST-T changes.  Recent Labs: 11/12/2014: ALT 9; Magnesium 2.1; TSH 1.91 11/20/2014: BUN 24*; Creatinine, Ser 1.50*; Hemoglobin 13.8; Platelets 386; Potassium 5.0; Sodium 134*  04/21/2014: Cholesterol 127; HDL 52.80; LDL Cholesterol 55; Total CHOL/HDL Ratio 2; Triglycerides 97.0; VLDL 19.4   Estimated Creatinine Clearance: 22.9 mL/min (by C-G formula based on Cr of 1.5).   Wt Readings from Last 3 Encounters:  11/24/14 134 lb (60.782 kg)  11/20/14 134 lb 12.8 oz (61.145 kg)  11/12/14 133 lb 12.8 oz (60.691 kg)     Other studies reviewed: Additional studies/records reviewed today include: summarized above  ASSESSMENT AND PLAN:  1. Paroxysmal atrial fib - maintaining NSR post-DCCV without clinical recurrence. No recurrent bleeding on Xarelto. 2. Essential  hypertension - controlled. 3. Chronic diastolic CHF - appears compensated. No orthopnea, LEE, weight changes. 4. CKD stage III - recent BUN/Cr just above baseline. Will recheck today to make sure trending back towards stability. Daughter also has questions about whether potassium is still on the higher side and this will help [name] check. 5. Cough - by history sounds like post nasal drip, but recent CXR showed possible progression of interstitial lung disease. Per review of chart she had interstitial lung disease noted on a CT scan in 02/2011 before any initiation of antiarrhythmic therapy so this does not appear related to Multaq. Multaq was added 03/2011. I have asked the patient and her daughter to discuss further workup of cough with primary care doctor, which may require referral to pulmonology. O2 sat is 98% in clinic today. Addendum: d/w Dr. Ellyn Hack who recommends to continue Multaq.  Disposition: F/u with Dr. Ellyn Hack in 6 months.  Current medicines are reviewed at length with the patient today.  The patient did  not have any concerns regarding medicines.  Raechel Ache PA-C 11/24/2014 10:52 AM     [facility] [place] [address] [phone] (office)  [phone] (fax)

## 2014-11-24 ENCOUNTER — Encounter: Payer: Self-pay | Admitting: Physician Assistant

## 2014-11-24 ENCOUNTER — Ambulatory Visit (INDEPENDENT_AMBULATORY_CARE_PROVIDER_SITE_OTHER): Payer: Medicare Other | Admitting: Physician Assistant

## 2014-11-24 VITALS — BP 118/64 | HR 62 | Ht 61.0 in | Wt 134.0 lb

## 2014-11-24 DIAGNOSIS — N183 Chronic kidney disease, stage 3 unspecified: Secondary | ICD-10-CM

## 2014-11-24 DIAGNOSIS — I480 Paroxysmal atrial fibrillation: Secondary | ICD-10-CM

## 2014-11-24 DIAGNOSIS — I10 Essential (primary) hypertension: Secondary | ICD-10-CM

## 2014-11-24 DIAGNOSIS — I5032 Chronic diastolic (congestive) heart failure: Secondary | ICD-10-CM

## 2014-11-24 DIAGNOSIS — R05 Cough: Secondary | ICD-10-CM

## 2014-11-24 DIAGNOSIS — R059 Cough, unspecified: Secondary | ICD-10-CM

## 2014-11-24 LAB — BASIC METABOLIC PANEL
BUN: 25 mg/dL — ABNORMAL HIGH (ref 6–23)
CALCIUM: 9.6 mg/dL (ref 8.4–10.5)
CO2: 26 mEq/L (ref 19–32)
Chloride: 102 mEq/L (ref 96–112)
Creatinine, Ser: 1.45 mg/dL — ABNORMAL HIGH (ref 0.40–1.20)
GFR: 36.43 mL/min — ABNORMAL LOW (ref >60.00)
Glucose, Bld: 105 mg/dL — ABNORMAL HIGH (ref 70–99)
Potassium: 5.1 mEq/L (ref 3.5–5.1)
SODIUM: 135 meq/L (ref 135–145)

## 2014-11-24 NOTE — Progress Notes (Signed)
Quick Note:  Please call patient. Sodium is normal. I do notice that her potassium is still on the very upper end of normal and Cr remains a bit above where it usually falls (her baseline appears 1.1-1.4). At this time I would decrease lisinopril further to 5mg  daily and repeat BMET in about 7-10 days.  She should follow BP at home and call if running >130/80 on the lower dose of lisinopril. Inza Mikrut PA-C  ______

## 2014-11-24 NOTE — Patient Instructions (Signed)
Medication Instructions:  None  Labwork: BMET today  Testing/Procedures: None  Follow-Up: Your physician wants you to follow-up in: 6 months with Dr. Ellyn Hack. You will receive a reminder letter in the mail two months in advance. If you don't receive a letter, please call our office to schedule the follow-up appointment.   Any Other Special Instructions Will Be Listed Below (If Applicable).  Your physician recommends that you follow up with your Primary Care Physician if your cough does not improve.

## 2014-11-26 ENCOUNTER — Other Ambulatory Visit: Payer: Self-pay | Admitting: *Deleted

## 2014-11-26 ENCOUNTER — Telehealth: Payer: Self-pay | Admitting: Physician Assistant

## 2014-11-26 DIAGNOSIS — I10 Essential (primary) hypertension: Secondary | ICD-10-CM

## 2014-11-26 NOTE — Telephone Encounter (Signed)
Daughter, Manuela Schwartz calling back.  Scheduled app for her mom to get Lab work on 7/7. Placed order for BMET. Also advised she need to call Dr. Etter Sjogren for an appointment regarding her lungs. She will wait until next labs are done to make an appointment with Dr. Etter Sjogren. Also will send labs to her nephrologist Dr. Neta Ehlers at [facility]

## 2014-11-26 NOTE — Telephone Encounter (Signed)
New problem   Pt came in on 6.28.16 and saw Danya Dunn and had labs drawn, per daughter calling back today she received a call from nurse Pam and was told to come in and have those labs repeated from 6.28.16. There are no orders in the system but i did schedule an appt for 7.7.16.

## 2014-12-03 ENCOUNTER — Other Ambulatory Visit (INDEPENDENT_AMBULATORY_CARE_PROVIDER_SITE_OTHER): Payer: Medicare Other | Admitting: *Deleted

## 2014-12-03 DIAGNOSIS — I10 Essential (primary) hypertension: Secondary | ICD-10-CM

## 2014-12-03 LAB — BASIC METABOLIC PANEL
BUN: 22 mg/dL (ref 6–23)
CHLORIDE: 99 meq/L (ref 96–112)
CO2: 26 meq/L (ref 19–32)
CREATININE: 1.26 mg/dL — AB (ref 0.40–1.20)
Calcium: 9.4 mg/dL (ref 8.4–10.5)
GFR: 42.83 mL/min — ABNORMAL LOW (ref >60.00)
Glucose, Bld: 84 mg/dL (ref 70–99)
Potassium: 4.7 mEq/L (ref 3.5–5.1)
Sodium: 132 mEq/L — ABNORMAL LOW (ref 135–145)

## 2014-12-25 ENCOUNTER — Ambulatory Visit (INDEPENDENT_AMBULATORY_CARE_PROVIDER_SITE_OTHER): Payer: Medicare Other | Admitting: Family Medicine

## 2014-12-25 ENCOUNTER — Encounter: Payer: Self-pay | Admitting: Family Medicine

## 2014-12-25 VITALS — BP 118/78 | HR 54 | Temp 98.9°F | Ht 60.0 in | Wt 134.0 lb

## 2014-12-25 DIAGNOSIS — R300 Dysuria: Secondary | ICD-10-CM

## 2014-12-25 DIAGNOSIS — N390 Urinary tract infection, site not specified: Secondary | ICD-10-CM | POA: Diagnosis not present

## 2014-12-25 LAB — POCT URINALYSIS DIPSTICK
Bilirubin, UA: NEGATIVE
Blood, UA: NEGATIVE
GLUCOSE UA: NEGATIVE
Ketones, UA: NEGATIVE
NITRITE UA: POSITIVE
Spec Grav, UA: >=1.03
UROBILINOGEN UA: 2
pH, UA: 6

## 2014-12-25 MED ORDER — NITROFURANTOIN MONOHYD MACRO 100 MG PO CAPS: 100.0000 mg | ORAL_CAPSULE | Freq: Two times a day (BID) | ORAL | Status: DC

## 2014-12-25 NOTE — Progress Notes (Signed)
Pre visit review using our clinic review tool, if applicable. No additional management support is needed unless otherwise documented below in the visit note. 

## 2014-12-25 NOTE — Progress Notes (Signed)
  JYN:WGNFAO Lowne, DO Chief Complaint  Patient presents with  . Hospitalization Follow-up    Atrial Fib  . Urinary Tract Infection    follow up    Current Issues:  Presents with 2-3 days of dysuria Associated symptoms include:  dysuria and urinary frequency  There is a previous history of of similar symptoms. Sexually active:  No   No concern for STI.  Prior to Admission medications   Medication Sig Start Date End Date Taking? Authorizing Provider  acetaminophen (TYLENOL) 500 MG tablet Take 500 mg by mouth every 6 (six) hours as needed for headache.   Yes Historical Provider, MD  dronedarone (MULTAQ) 400 MG tablet Take 1 tablet (400 mg total) by mouth 2 (two) times daily with a meal. 09/24/14  Yes Leonie Man, MD  lisinopril (PRINIVIL,ZESTRIL) 10 MG tablet Take 5 mg by mouth daily. 11/04/14  Yes Historical Provider, MD  metoprolol tartrate (LOPRESSOR) 25 MG tablet Take 0.5 tablets (12.5 mg total) by mouth 2 (two) times daily. 09/24/14  Yes Leonie Man, MD  nitroGLYCERIN (NITROSTAT) 0.4 MG SL tablet Place 1 tablet (0.4 mg total) under the tongue every 5 (five) minutes as needed. For chest pain 09/19/13  Yes Leonie Man, MD  Vitamin D, Ergocalciferol, (DRISDOL) 50000 UNITS CAPS capsule Take 50,000 Units by mouth every 7 (seven) days. Wednesday   Yes Historical Provider, MD  XARELTO 15 MG TABS tablet Take 15 mg by mouth daily.  09/24/14  Yes Historical Provider, MD  nitrofurantoin, macrocrystal-monohydrate, (MACROBID) 100 MG capsule Take 1 capsule (100 mg total) by mouth 2 (two) times daily. 12/25/14   Rosalita Chessman, DO    Review of Systems:Review of Systems - Negative except dysuria and frequency  PE:  BP 118/78 mmHg  Pulse 54  Temp(Src) 98.9 F (37.2 C) (Oral)  Ht 5' (1.524 m)  Wt 134 lb (60.782 kg)  BMI 26.17 kg/m2  SpO2 98% Constitutional- no fever, chills Heart- S1S2 Lungs--clear Back- no flank pain Abdomen--soft , nt   Results for orders placed or performed in  visit on 12/25/14  POCT Urinalysis Dipstick  Result Value Ref Range   Color, UA Yellow    Clarity, UA Cloudy    Glucose, UA Neg    Bilirubin, UA Neg    Ketones, UA Neg    Spec Grav, UA >=1.030    Blood, UA Neg    pH, UA 6.0    Protein, UA Small    Urobilinogen, UA 2.0    Nitrite, UA Positive    Leukocytes, UA moderate (2+) (A) Negative    Assessment and Plan:  1. Dysuria   - POCT Urinalysis Dipstick - Urine Culture - nitrofurantoin, macrocrystal-monohydrate, (MACROBID) 100 MG capsule; Take 1 capsule (100 mg total) by mouth 2 (two) times daily.  Dispense: 14 capsule; Refill: 0  2. Urinary tract infection without hematuria, site unspecified   - nitrofurantoin, macrocrystal-monohydrate, (MACROBID) 100 MG capsule; Take 1 capsule (100 mg total) by mouth 2 (two) times daily.  Dispense: 14 capsule; Refill: 0 - POCT urinalysis dipstick; Future Pharmacy downstairs was called to make sure she could take macrobid with multaq

## 2014-12-25 NOTE — Patient Instructions (Signed)

## 2014-12-28 LAB — URINE CULTURE: Colony Count: >=100000

## 2015-01-11 LAB — POCT URINALYSIS DIPSTICK
GLUCOSE UA: NEGATIVE
Leukocytes, UA: NEGATIVE
NITRITE UA: NEGATIVE
PH UA: 5.5
RBC UA: NEGATIVE
UROBILINOGEN UA: 2

## 2015-01-11 NOTE — Addendum Note (Signed)
Addended by: Tasia Catchings on: 01/11/2015 12:20 PM   Modules accepted: Orders

## 2015-03-30 ENCOUNTER — Ambulatory Visit: Payer: PRIVATE HEALTH INSURANCE | Admitting: Family Medicine

## 2015-05-10 ENCOUNTER — Telehealth: Payer: Self-pay | Admitting: Behavioral Health

## 2015-05-10 ENCOUNTER — Encounter: Payer: Self-pay | Admitting: Behavioral Health

## 2015-05-10 NOTE — Telephone Encounter (Signed)
Unable to reach patient at time of Pre-Visit Call.  Left message for patient to return call when available.    

## 2015-05-10 NOTE — Addendum Note (Signed)
Addended by: Kathlen Brunswick on: 05/10/2015 05:35 PM   Modules accepted: Orders, Medications

## 2015-05-10 NOTE — Telephone Encounter (Signed)
Pre-Visit Call completed with patient and chart updated.   Pre-Visit Info documented in Specialty Comments under [employer].    

## 2015-05-11 ENCOUNTER — Encounter: Payer: Self-pay | Admitting: Family Medicine

## 2015-05-11 ENCOUNTER — Ambulatory Visit (INDEPENDENT_AMBULATORY_CARE_PROVIDER_SITE_OTHER): Payer: Medicare Other | Admitting: Family Medicine

## 2015-05-11 VITALS — BP 128/70 | HR 62 | Temp 97.9°F | Ht 60.0 in | Wt 136.4 lb

## 2015-05-11 DIAGNOSIS — I480 Paroxysmal atrial fibrillation: Secondary | ICD-10-CM | POA: Diagnosis not present

## 2015-05-11 DIAGNOSIS — I10 Essential (primary) hypertension: Secondary | ICD-10-CM

## 2015-05-11 DIAGNOSIS — Z0000 Encounter for general adult medical examination without abnormal findings: Secondary | ICD-10-CM | POA: Diagnosis not present

## 2015-05-11 DIAGNOSIS — Z23 Encounter for immunization: Secondary | ICD-10-CM

## 2015-05-11 DIAGNOSIS — M810 Age-related osteoporosis without current pathological fracture: Secondary | ICD-10-CM | POA: Diagnosis not present

## 2015-05-11 LAB — CBC WITH DIFFERENTIAL/PLATELET
BASOS ABS: 0.1 10*3/uL (ref 0.0–0.1)
Basophils Relative: 0.5 % (ref 0.0–3.0)
Eosinophils Absolute: 0.3 10*3/uL (ref 0.0–0.7)
Eosinophils Relative: 3.2 % (ref 0.0–5.0)
HEMATOCRIT: 43.1 % (ref 36.0–46.0)
HEMOGLOBIN: 14.5 g/dL (ref 12.0–15.0)
LYMPHS PCT: 15.5 % (ref 12.0–46.0)
Lymphs Abs: 1.6 10*3/uL (ref 0.7–4.0)
MCHC: 33.6 g/dL (ref 30.0–36.0)
MCV: 92.8 fl (ref 78.0–100.0)
MONOS PCT: 8.1 % (ref 3.0–12.0)
Monocytes Absolute: 0.8 10*3/uL (ref 0.1–1.0)
NEUTROS ABS: 7.6 10*3/uL (ref 1.4–7.7)
Neutrophils Relative %: 72.7 % (ref 43.0–77.0)
PLATELETS: 377 10*3/uL (ref 150.0–400.0)
RBC: 4.65 Mil/uL (ref 3.87–5.11)
RDW: 13.9 % (ref 11.5–15.5)
WBC: 10.5 10*3/uL (ref 4.0–10.5)

## 2015-05-11 LAB — LIPID PANEL
CHOL/HDL RATIO: 2
Cholesterol: 128 mg/dL (ref 0–200)
HDL: 56.1 mg/dL (ref >39.00)
LDL CALC: 52 mg/dL (ref 0–99)
NONHDL: 71.83
TRIGLYCERIDES: 97 mg/dL (ref 0.0–149.0)
VLDL: 19.4 mg/dL (ref 0.0–40.0)

## 2015-05-11 LAB — COMPREHENSIVE METABOLIC PANEL
ALBUMIN: 4 g/dL (ref 3.5–5.2)
ALT: 9 U/L (ref 0–35)
AST: 14 U/L (ref 0–37)
Alkaline Phosphatase: 67 U/L (ref 39–117)
BILIRUBIN TOTAL: 0.9 mg/dL (ref 0.2–1.2)
BUN: 27 mg/dL — ABNORMAL HIGH (ref 6–23)
CALCIUM: 9.4 mg/dL (ref 8.4–10.5)
CHLORIDE: 102 meq/L (ref 96–112)
CO2: 27 meq/L (ref 19–32)
Creatinine, Ser: 1.46 mg/dL — ABNORMAL HIGH (ref 0.40–1.20)
GFR: 36.1 mL/min — AB (ref >60.00)
Glucose, Bld: 96 mg/dL (ref 70–99)
Potassium: 5.1 mEq/L (ref 3.5–5.1)
Sodium: 136 mEq/L (ref 135–145)
Total Protein: 7.2 g/dL (ref 6.0–8.3)

## 2015-05-11 NOTE — Addendum Note (Signed)
Addended by: Bunnie Domino on: 05/11/2015 11:47 AM   Modules accepted: Orders

## 2015-05-11 NOTE — Progress Notes (Signed)
Pre visit review using our clinic review tool, if applicable. No additional management support is needed unless otherwise documented below in the visit note. 

## 2015-05-11 NOTE — Patient Instructions (Signed)
Preventive Care for Adults, Female A healthy lifestyle and preventive care can promote health and wellness. Preventive health guidelines for women include the following key practices.  A routine yearly physical is a good way to check with your health care provider about your health and preventive screening. It is a chance to share any concerns and updates on your health and to receive a thorough exam.  Visit your dentist for a routine exam and preventive care every 6 months. Brush your teeth twice a day and floss once a day. Good oral hygiene prevents tooth decay and gum disease.  The frequency of eye exams is based on your age, health, family medical history, use of contact lenses, and other factors. Follow your health care provider's recommendations for frequency of eye exams.  Eat a healthy diet. Foods like vegetables, fruits, whole grains, low-fat dairy products, and lean protein foods contain the nutrients you need without too many calories. Decrease your intake of foods high in solid fats, added sugars, and salt. Eat the right amount of calories for you.Get information about a proper diet from your health care provider, if necessary.  Regular physical exercise is one of the most important things you can do for your health. Most adults should get at least 150 minutes of moderate-intensity exercise (any activity that increases your heart rate and causes you to sweat) each week. In addition, most adults need muscle-strengthening exercises on 2 or more days a week.  Maintain a healthy weight. The body mass index (BMI) is a screening tool to identify possible weight problems. It provides an estimate of body fat based on height and weight. Your health care provider can find your BMI and can help you achieve or maintain a healthy weight.For adults 20 years and older:  A BMI below 18.5 is considered underweight.  A BMI of 18.5 to 24.9 is normal.  A BMI of 25 to 29.9 is considered overweight.  A  BMI of 30 and above is considered obese.  Maintain normal blood lipids and cholesterol levels by exercising and minimizing your intake of saturated fat. Eat a balanced diet with plenty of fruit and vegetables. Blood tests for lipids and cholesterol should begin at age 45 and be repeated every 5 years. If your lipid or cholesterol levels are high, you are over 50, or you are at high risk for heart disease, you may need your cholesterol levels checked more frequently.Ongoing high lipid and cholesterol levels should be treated with medicines if diet and exercise are not working.  If you smoke, find out from your health care provider how to quit. If you do not use tobacco, do not start.  Lung cancer screening is recommended for adults aged 45-80 years who are at high risk for developing lung cancer because of a history of smoking. A yearly low-dose CT scan of the lungs is recommended for people who have at least a 30-pack-year history of smoking and are a current smoker or have quit within the past 15 years. A pack year of smoking is smoking an average of 1 pack of cigarettes a day for 1 year (for example: 1 pack a day for 30 years or 2 packs a day for 15 years). Yearly screening should continue until the smoker has stopped smoking for at least 15 years. Yearly screening should be stopped for people who develop a health problem that would prevent them from having lung cancer treatment.  If you are pregnant, do not drink alcohol. If you are  breastfeeding, be very cautious about drinking alcohol. If you are not pregnant and choose to drink alcohol, do not have more than 1 drink per day. One drink is considered to be 12 ounces (355 mL) of beer, 5 ounces (148 mL) of wine, or 1.5 ounces (44 mL) of liquor.  Avoid use of street drugs. Do not share needles with anyone. Ask for help if you need support or instructions about stopping the use of drugs.  High blood pressure causes heart disease and increases the risk  of stroke. Your blood pressure should be checked at least every 1 to 2 years. Ongoing high blood pressure should be treated with medicines if weight loss and exercise do not work.  If you are 55-79 years old, ask your health care provider if you should take aspirin to prevent strokes.  Diabetes screening is done by taking a blood sample to check your blood glucose level after you have not eaten for a certain period of time (fasting). If you are not overweight and you do not have risk factors for diabetes, you should be screened once every 3 years starting at age 45. If you are overweight or obese and you are 40-70 years of age, you should be screened for diabetes every year as part of your cardiovascular risk assessment.  Breast cancer screening is essential preventive care for women. You should practice "breast self-awareness." This means understanding the normal appearance and feel of your breasts and may include breast self-examination. Any changes detected, no matter how small, should be reported to a health care provider. Women in their 20s and 30s should have a clinical breast exam (CBE) by a health care provider as part of a regular health exam every 1 to 3 years. After age 40, women should have a CBE every year. Starting at age 40, women should consider having a mammogram (breast X-ray test) every year. Women who have a family history of breast cancer should talk to their health care provider about genetic screening. Women at a high risk of breast cancer should talk to their health care providers about having an MRI and a mammogram every year.  Breast cancer gene (BRCA)-related cancer risk assessment is recommended for women who have family members with BRCA-related cancers. BRCA-related cancers include breast, ovarian, tubal, and peritoneal cancers. Having family members with these cancers may be associated with an increased risk for harmful changes (mutations) in the breast cancer genes BRCA1 and  BRCA2. Results of the assessment will determine the need for genetic counseling and BRCA1 and BRCA2 testing.  Your health care provider may recommend that you be screened regularly for cancer of the pelvic organs (ovaries, uterus, and vagina). This screening involves a pelvic examination, including checking for microscopic changes to the surface of your cervix (Pap test). You may be encouraged to have this screening done every 3 years, beginning at age 21.  For women ages 30-65, health care providers may recommend pelvic exams and Pap testing every 3 years, or they may recommend the Pap and pelvic exam, combined with testing for human papilloma virus (HPV), every 5 years. Some types of HPV increase your risk of cervical cancer. Testing for HPV may also be done on women of any age with unclear Pap test results.  Other health care providers may not recommend any screening for nonpregnant women who are considered low risk for pelvic cancer and who do not have symptoms. Ask your health care provider if a screening pelvic exam is right for   you.  If you have had past treatment for cervical cancer or a condition that could lead to cancer, you need Pap tests and screening for cancer for at least 20 years after your treatment. If Pap tests have been discontinued, your risk factors (such as having a new sexual partner) need to be reassessed to determine if screening should resume. Some women have medical problems that increase the chance of getting cervical cancer. In these cases, your health care provider may recommend more frequent screening and Pap tests.  Colorectal cancer can be detected and often prevented. Most routine colorectal cancer screening begins at the age of 50 years and continues through age 75 years. However, your health care provider may recommend screening at an earlier age if you have risk factors for colon cancer. On a yearly basis, your health care provider may provide home test kits to check  for hidden blood in the stool. Use of a small camera at the end of a tube, to directly examine the colon (sigmoidoscopy or colonoscopy), can detect the earliest forms of colorectal cancer. Talk to your health care provider about this at age 50, when routine screening begins. Direct exam of the colon should be repeated every 5-10 years through age 75 years, unless early forms of precancerous polyps or small growths are found.  People who are at an increased risk for hepatitis B should be screened for this virus. You are considered at high risk for hepatitis B if:  You were born in a country where hepatitis B occurs often. Talk with your health care provider about which countries are considered high risk.  Your parents were born in a high-risk country and you have not received a shot to protect against hepatitis B (hepatitis B vaccine).  You have HIV or AIDS.  You use needles to inject street drugs.  You live with, or have sex with, someone who has hepatitis B.  You get hemodialysis treatment.  You take certain medicines for conditions like cancer, organ transplantation, and autoimmune conditions.  Hepatitis C blood testing is recommended for all people born from 1945 through 1965 and any individual with known risks for hepatitis C.  Practice safe sex. Use condoms and avoid high-risk sexual practices to reduce the spread of sexually transmitted infections (STIs). STIs include gonorrhea, chlamydia, syphilis, trichomonas, herpes, HPV, and human immunodeficiency virus (HIV). Herpes, HIV, and HPV are viral illnesses that have no cure. They can result in disability, cancer, and death.  You should be screened for sexually transmitted illnesses (STIs) including gonorrhea and chlamydia if:  You are sexually active and are younger than 24 years.  You are older than 24 years and your health care provider tells you that you are at risk for this type of infection.  Your sexual activity has changed  since you were last screened and you are at an increased risk for chlamydia or gonorrhea. Ask your health care provider if you are at risk.  If you are at risk of being infected with HIV, it is recommended that you take a prescription medicine daily to prevent HIV infection. This is called preexposure prophylaxis (PrEP). You are considered at risk if:  You are sexually active and do not regularly use condoms or know the HIV status of your partner(s).  You take drugs by injection.  You are sexually active with a partner who has HIV.  Talk with your health care provider about whether you are at high risk of being infected with HIV. If   you choose to begin PrEP, you should first be tested for HIV. You should then be tested every 3 months for as long as you are taking PrEP.  Osteoporosis is a disease in which the bones lose minerals and strength with aging. This can result in serious bone fractures or breaks. The risk of osteoporosis can be identified using a bone density scan. Women ages 67 years and over and women at risk for fractures or osteoporosis should discuss screening with their health care providers. Ask your health care provider whether you should take a calcium supplement or vitamin D to reduce the rate of osteoporosis.  Menopause can be associated with physical symptoms and risks. Hormone replacement therapy is available to decrease symptoms and risks. You should talk to your health care provider about whether hormone replacement therapy is right for you.  Use sunscreen. Apply sunscreen liberally and repeatedly throughout the day. You should seek shade when your shadow is shorter than you. Protect yourself by wearing long sleeves, pants, a wide-brimmed hat, and sunglasses year round, whenever you are outdoors.  Once a month, do a whole body skin exam, using a mirror to look at the skin on your back. Tell your health care provider of new moles, moles that have irregular borders, moles that  are larger than a pencil eraser, or moles that have changed in shape or color.  Stay current with required vaccines (immunizations).  Influenza vaccine. All adults should be immunized every year.  Tetanus, diphtheria, and acellular pertussis (Td, Tdap) vaccine. Pregnant women should receive 1 dose of Tdap vaccine during each pregnancy. The dose should be obtained regardless of the length of time since the last dose. Immunization is preferred during the 27th-36th week of gestation. An adult who has not previously received Tdap or who does not know her vaccine status should receive 1 dose of Tdap. This initial dose should be followed by tetanus and diphtheria toxoids (Td) booster doses every 10 years. Adults with an unknown or incomplete history of completing a 3-dose immunization series with Td-containing vaccines should begin or complete a primary immunization series including a Tdap dose. Adults should receive a Td booster every 10 years.  Varicella vaccine. An adult without evidence of immunity to varicella should receive 2 doses or a second dose if she has previously received 1 dose. Pregnant females who do not have evidence of immunity should receive the first dose after pregnancy. This first dose should be obtained before leaving the health care facility. The second dose should be obtained 4-8 weeks after the first dose.  Human papillomavirus (HPV) vaccine. Females aged 13-26 years who have not received the vaccine previously should obtain the 3-dose series. The vaccine is not recommended for use in pregnant females. However, pregnancy testing is not needed before receiving a dose. If a female is found to be pregnant after receiving a dose, no treatment is needed. In that case, the remaining doses should be delayed until after the pregnancy. Immunization is recommended for any person with an immunocompromised condition through the age of 61 years if she did not get any or all doses earlier. During the  3-dose series, the second dose should be obtained 4-8 weeks after the first dose. The third dose should be obtained 24 weeks after the first dose and 16 weeks after the second dose.  Zoster vaccine. One dose is recommended for adults aged 30 years or older unless certain conditions are present.  Measles, mumps, and rubella (MMR) vaccine. Adults born  before 1957 generally are considered immune to measles and mumps. Adults born in 1957 or later should have 1 or more doses of MMR vaccine unless there is a contraindication to the vaccine or there is laboratory evidence of immunity to each of the three diseases. A routine second dose of MMR vaccine should be obtained at least 28 days after the first dose for students attending postsecondary schools, health care workers, or international travelers. People who received inactivated measles vaccine or an unknown type of measles vaccine during 1963-1967 should receive 2 doses of MMR vaccine. People who received inactivated mumps vaccine or an unknown type of mumps vaccine before 1979 and are at high risk for mumps infection should consider immunization with 2 doses of MMR vaccine. For females of childbearing age, rubella immunity should be determined. If there is no evidence of immunity, females who are not pregnant should be vaccinated. If there is no evidence of immunity, females who are pregnant should delay immunization until after pregnancy. Unvaccinated health care workers born before 1957 who lack laboratory evidence of measles, mumps, or rubella immunity or laboratory confirmation of disease should consider measles and mumps immunization with 2 doses of MMR vaccine or rubella immunization with 1 dose of MMR vaccine.  Pneumococcal 13-valent conjugate (PCV13) vaccine. When indicated, a person who is uncertain of his immunization history and has no record of immunization should receive the PCV13 vaccine. All adults 65 years of age and older should receive this  vaccine. An adult aged 19 years or older who has certain medical conditions and has not been previously immunized should receive 1 dose of PCV13 vaccine. This PCV13 should be followed with a dose of pneumococcal polysaccharide (PPSV23) vaccine. Adults who are at high risk for pneumococcal disease should obtain the PPSV23 vaccine at least 8 weeks after the dose of PCV13 vaccine. Adults older than 79 years of age who have normal immune system function should obtain the PPSV23 vaccine dose at least 1 year after the dose of PCV13 vaccine.  Pneumococcal polysaccharide (PPSV23) vaccine. When PCV13 is also indicated, PCV13 should be obtained first. All adults aged 65 years and older should be immunized. An adult younger than age 65 years who has certain medical conditions should be immunized. Any person who resides in a nursing home or long-term care facility should be immunized. An adult smoker should be immunized. People with an immunocompromised condition and certain other conditions should receive both PCV13 and PPSV23 vaccines. People with human immunodeficiency virus (HIV) infection should be immunized as soon as possible after diagnosis. Immunization during chemotherapy or radiation therapy should be avoided. Routine use of PPSV23 vaccine is not recommended for American Indians, [place] Natives, or people younger than 65 years unless there are medical conditions that require PPSV23 vaccine. When indicated, people who have unknown immunization and have no record of immunization should receive PPSV23 vaccine. One-time revaccination 5 years after the first dose of PPSV23 is recommended for people aged 19-64 years who have chronic kidney failure, nephrotic syndrome, asplenia, or immunocompromised conditions. People who received 1-2 doses of PPSV23 before age 65 years should receive another dose of PPSV23 vaccine at age 65 years or later if at least 5 years have passed since the previous dose. Doses of PPSV23 are not  needed for people immunized with PPSV23 at or after age 65 years.  Meningococcal vaccine. Adults with asplenia or persistent complement component deficiencies should receive 2 doses of quadrivalent meningococcal conjugate (MenACWY-D) vaccine. The doses should be obtained   at least 2 months apart. Microbiologists working with certain meningococcal bacteria, [place] recruits, people at risk during an outbreak, and people who travel to or live in countries with a high rate of meningitis should be immunized. A first-year college student up through age 34 years who is living in a residence hall should receive a dose if she did not receive a dose on or after her 16th birthday. Adults who have certain high-risk conditions should receive one or more doses of vaccine.  Hepatitis A vaccine. Adults who wish to be protected from this disease, have certain high-risk conditions, work with hepatitis A-infected animals, work in hepatitis A research labs, or travel to or work in countries with a high rate of hepatitis A should be immunized. Adults who were previously unvaccinated and who anticipate close contact with an international adoptee during the first 60 days after arrival in the [place] from a country with a high rate of hepatitis A should be immunized.  Hepatitis B vaccine. Adults who wish to be protected from this disease, have certain high-risk conditions, may be exposed to blood or other infectious body fluids, are household contacts or sex partners of hepatitis B positive people, are clients or workers in certain care facilities, or travel to or work in countries with a high rate of hepatitis B should be immunized.  Haemophilus influenzae type b (Hib) vaccine. A previously unvaccinated person with asplenia or sickle cell disease or having a scheduled splenectomy should receive 1 dose of Hib vaccine. Regardless of previous immunization, a recipient of a hematopoietic stem cell transplant should receive a  3-dose series 6-12 months after her successful transplant. Hib vaccine is not recommended for adults with HIV infection. Preventive Services / Frequency Ages 35 to 4 years  Blood pressure check.** / Every 3-5 years.  Lipid and cholesterol check.** / Every 5 years beginning at age 60.  Clinical breast exam.** / Every 3 years for women in their 71s and 10s.  BRCA-related cancer risk assessment.** / For women who have family members with a BRCA-related cancer (breast, ovarian, tubal, or peritoneal cancers).  Pap test.** / Every 2 years from ages 76 through 26. Every 3 years starting at age 61 through age 76 or 93 with a history of 3 consecutive normal Pap tests.  HPV screening.** / Every 3 years from ages 37 through ages 60 to 51 with a history of 3 consecutive normal Pap tests.  Hepatitis C blood test.** / For any individual with known risks for hepatitis C.  Skin self-exam. / Monthly.  Influenza vaccine. / Every year.  Tetanus, diphtheria, and acellular pertussis (Tdap, Td) vaccine.** / Consult your health care provider. Pregnant women should receive 1 dose of Tdap vaccine during each pregnancy. 1 dose of Td every 10 years.  Varicella vaccine.** / Consult your health care provider. Pregnant females who do not have evidence of immunity should receive the first dose after pregnancy.  HPV vaccine. / 3 doses over 6 months, if 93 and younger. The vaccine is not recommended for use in pregnant females. However, pregnancy testing is not needed before receiving a dose.  Measles, mumps, rubella (MMR) vaccine.** / You need at least 1 dose of MMR if you were born in 1957 or later. You may also need a 2nd dose. For females of childbearing age, rubella immunity should be determined. If there is no evidence of immunity, females who are not pregnant should be vaccinated. If there is no evidence of immunity, females who are  pregnant should delay immunization until after pregnancy.  Pneumococcal  13-valent conjugate (PCV13) vaccine.** / Consult your health care provider.  Pneumococcal polysaccharide (PPSV23) vaccine.** / 1 to 2 doses if you smoke cigarettes or if you have certain conditions.  Meningococcal vaccine.** / 1 dose if you are age 68 to 8 years and a Market researcher living in a residence hall, or have one of several medical conditions, you need to get vaccinated against meningococcal disease. You may also need additional booster doses.  Hepatitis A vaccine.** / Consult your health care provider.  Hepatitis B vaccine.** / Consult your health care provider.  Haemophilus influenzae type b (Hib) vaccine.** / Consult your health care provider. Ages 7 to 53 years  Blood pressure check.** / Every year.  Lipid and cholesterol check.** / Every 5 years beginning at age 25 years.  Lung cancer screening. / Every year if you are aged 11-80 years and have a 30-pack-year history of smoking and currently smoke or have quit within the past 15 years. Yearly screening is stopped once you have quit smoking for at least 15 years or develop a health problem that would prevent you from having lung cancer treatment.  Clinical breast exam.** / Every year after age 48 years.  BRCA-related cancer risk assessment.** / For women who have family members with a BRCA-related cancer (breast, ovarian, tubal, or peritoneal cancers).  Mammogram.** / Every year beginning at age 41 years and continuing for as long as you are in good health. Consult with your health care provider.  Pap test.** / Every 3 years starting at age 65 years through age 37 or 70 years with a history of 3 consecutive normal Pap tests.  HPV screening.** / Every 3 years from ages 72 years through ages 60 to 40 years with a history of 3 consecutive normal Pap tests.  Fecal occult blood test (FOBT) of stool. / Every year beginning at age 21 years and continuing until age 5 years. You may not need to do this test if you get  a colonoscopy every 10 years.  Flexible sigmoidoscopy or colonoscopy.** / Every 5 years for a flexible sigmoidoscopy or every 10 years for a colonoscopy beginning at age 35 years and continuing until age 48 years.  Hepatitis C blood test.** / For all people born from [birth year] through 1965 and any individual with known risks for hepatitis C.  Skin self-exam. / Monthly.  Influenza vaccine. / Every year.  Tetanus, diphtheria, and acellular pertussis (Tdap/Td) vaccine.** / Consult your health care provider. Pregnant women should receive 1 dose of Tdap vaccine during each pregnancy. 1 dose of Td every 10 years.  Varicella vaccine.** / Consult your health care provider. Pregnant females who do not have evidence of immunity should receive the first dose after pregnancy.  Zoster vaccine.** / 1 dose for adults aged 30 years or older.  Measles, mumps, rubella (MMR) vaccine.** / You need at least 1 dose of MMR if you were born in 1957 or later. You may also need a second dose. For females of childbearing age, rubella immunity should be determined. If there is no evidence of immunity, females who are not pregnant should be vaccinated. If there is no evidence of immunity, females who are pregnant should delay immunization until after pregnancy.  Pneumococcal 13-valent conjugate (PCV13) vaccine.** / Consult your health care provider.  Pneumococcal polysaccharide (PPSV23) vaccine.** / 1 to 2 doses if you smoke cigarettes or if you have certain conditions.  Meningococcal vaccine.** /  Consult your health care provider.  Hepatitis A vaccine.** / Consult your health care provider.  Hepatitis B vaccine.** / Consult your health care provider.  Haemophilus influenzae type b (Hib) vaccine.** / Consult your health care provider. Ages 64 years and over  Blood pressure check.** / Every year.  Lipid and cholesterol check.** / Every 5 years beginning at age 23 years.  Lung cancer screening. / Every year if you  are aged 16-80 years and have a 30-pack-year history of smoking and currently smoke or have quit within the past 15 years. Yearly screening is stopped once you have quit smoking for at least 15 years or develop a health problem that would prevent you from having lung cancer treatment.  Clinical breast exam.** / Every year after age 74 years.  BRCA-related cancer risk assessment.** / For women who have family members with a BRCA-related cancer (breast, ovarian, tubal, or peritoneal cancers).  Mammogram.** / Every year beginning at age 44 years and continuing for as long as you are in good health. Consult with your health care provider.  Pap test.** / Every 3 years starting at age 58 years through age 22 or 39 years with 3 consecutive normal Pap tests. Testing can be stopped between 65 and 70 years with 3 consecutive normal Pap tests and no abnormal Pap or HPV tests in the past 10 years.  HPV screening.** / Every 3 years from ages 64 years through ages 70 or 61 years with a history of 3 consecutive normal Pap tests. Testing can be stopped between 65 and 70 years with 3 consecutive normal Pap tests and no abnormal Pap or HPV tests in the past 10 years.  Fecal occult blood test (FOBT) of stool. / Every year beginning at age 40 years and continuing until age 27 years. You may not need to do this test if you get a colonoscopy every 10 years.  Flexible sigmoidoscopy or colonoscopy.** / Every 5 years for a flexible sigmoidoscopy or every 10 years for a colonoscopy beginning at age 7 years and continuing until age 32 years.  Hepatitis C blood test.** / For all people born from [birth year] through 1963 and any individual with known risks for hepatitis C.  Osteoporosis screening.** / A one-time screening for women ages 30 years and over and women at risk for fractures or osteoporosis.  Skin self-exam. / Monthly.  Influenza vaccine. / Every year.  Tetanus, diphtheria, and acellular pertussis (Tdap/Td)  vaccine.** / 1 dose of Td every 10 years.  Varicella vaccine.** / Consult your health care provider.  Zoster vaccine.** / 1 dose for adults aged 35 years or older.  Pneumococcal 13-valent conjugate (PCV13) vaccine.** / Consult your health care provider.  Pneumococcal polysaccharide (PPSV23) vaccine.** / 1 dose for all adults aged 46 years and older.  Meningococcal vaccine.** / Consult your health care provider.  Hepatitis A vaccine.** / Consult your health care provider.  Hepatitis B vaccine.** / Consult your health care provider.  Haemophilus influenzae type b (Hib) vaccine.** / Consult your health care provider. ** Family history and personal history of risk and conditions may change your health care provider's recommendations.   This information is not intended to replace advice given to you by your health care provider. Make sure you discuss any questions you have with your health care provider.   Document Released: 07/11/2001 Document Revised: 06/05/2014 Document Reviewed: 10/10/2010 Elsevier Interactive Patient Education [employer].

## 2015-05-11 NOTE — Progress Notes (Signed)
Subjective:   Allison Sellers is a 81 y.o. female who presents for Medicare Annual (Subsequent) preventive examination.  Review of Systems:   Review of Systems  Constitutional: Negative for activity change, appetite change and fatigue.  HENT: Negative for hearing loss-- no worse, + hearing aids, congestion, tinnitus and ear discharge.   Eyes: Negative for visual disturbance (see optho q1y -- vision corrected to 20/20 with glasses).  Respiratory: Negative for cough, chest tightness and shortness of breath.   Cardiovascular: Negative for chest pain, palpitations and leg swelling.  Gastrointestinal: Negative for abdominal pain, diarrhea, constipation and abdominal distention.  Genitourinary: Negative for urgency, frequency, decreased urine volume and difficulty urinating.  Musculoskeletal: Negative for back pain, arthralgias and gait problem.  Skin: Negative for color change, pallor and rash.  Neurological: Negative for dizziness, light-headedness, numbness and headaches.  Hematological: Negative for adenopathy. Does not bruise/bleed easily.  Psychiatric/Behavioral: Negative for suicidal ideas, confusion, sleep disturbance, self-injury, dysphoric mood, decreased concentration and agitation.  Pt is able to read and write and can do all ADLs No risk for falling No abuse/ violence in home           Objective:     Vitals: BP 128/70 mmHg  Pulse 62  Temp(Src) 97.9 F (36.6 C) (Oral)  Ht 5' (1.524 m)  Wt 136 lb 6.4 oz (61.871 kg)  BMI 26.64 kg/m2  SpO2 98% BP 128/70 mmHg  Pulse 62  Temp(Src) 97.9 F (36.6 C) (Oral)  Ht 5' (1.524 m)  Wt 136 lb 6.4 oz (61.871 kg)  BMI 26.64 kg/m2  SpO2 98% General appearance: alert, cooperative, appears stated age and no distress Head: Normocephalic, without obvious abnormality, atraumatic Eyes: negative findings: conjunctivae and sclerae normal and pupils equal, round, reactive to light and accomodation Ears: normal TM's and external ear  canals both ears Nose: Nares normal. Septum midline. Mucosa normal. No drainage or sinus tenderness. Throat: lips, mucosa, and tongue normal; teeth and gums normal Neck: no adenopathy, no carotid bruit, no JVD, supple, symmetrical, trachea midline and thyroid not enlarged, symmetric, no tenderness/mass/nodules Back: symmetric, no curvature. ROM normal. No CVA tenderness. Lungs: clear to auscultation bilaterally Breasts: normal appearance, no masses or tenderness Heart: S1, S2 normal Abdomen: soft, non-tender; bowel sounds normal; no masses,  no organomegaly Pelvic: not indicated; post-menopausal, no abnormal Pap smears in past Extremities: extremities normal, atraumatic, no cyanosis or edema Pulses: 2+ and symmetric Skin: Skin color, texture, turgor normal. No rashes or lesions Lymph nodes: Cervical, supraclavicular, and axillary nodes normal. Neurologic: Alert and oriented X 3, normal strength and tone. Normal symmetric reflexes. Normal coordination and gait Psych- no depression, no anxiety Tobacco History  Smoking status  . Never Smoker   Smokeless tobacco  . Never Used     Counseling given: Not Answered   Past Medical History  Diagnosis Date  . Hypertension   . Osteopenia   . PAF (paroxysmal atrial fibrillation) ([place])   . Seborrheic keratosis 06/2014    of hand.   . Chronic diastolic CHF (congestive heart failure) ([place])   . CKD (chronic kidney disease), stage III   . Diverticular disease   . Interstitial lung disease ([place])     a. Noted on CT scan 02/2011 before initiation of any antiarrhythmic meds.   Past Surgical History  Procedure Laterality Date  . Umbilical hernia repair  ? 2013  . Cardioversion  04/18/2011    Procedure: CARDIOVERSION;  Surgeon: Leonie Man;  Location: [facility] OR;  Service:  Cardiovascular;  Laterality: N/A;  . Lexiscan myoview  04/14/2011    No scintigraphic evidence of inducible myocardial ischemia, EKG negative for ischemia, patient developed  Atrial Fibrillation with rapid ventricular response during stress and persisted in the recovery period, abnormal myocardial perfusion study  . 2d echocardiogram  03/29/2011    EF 55-65%, normal  . Colonoscopy N/A 11/03/2014    Procedure: COLONOSCOPY;  Surgeon: Ladene Artist, MD;  Location: [facility] ENDOSCOPY;  Service: Endoscopy;  Laterality: N/A;  . Cardioversion N/A 11/20/2014    Procedure: CARDIOVERSION;  Surgeon: Jolaine Artist, MD;  Location: [facility] OR;  Service: Cardiovascular;  Laterality: N/A;   Family History  Problem Relation Age of Onset  . Arthritis    . Heart failure Father     CHF  . Heart failure Mother     CHF  . Macular degeneration Mother    History  Sexual Activity  . Sexual Activity: Not Currently    Outpatient Encounter Prescriptions as of 05/11/2015  Medication Sig  . dronedarone (MULTAQ) 400 MG tablet Take 1 tablet (400 mg total) by mouth 2 (two) times daily with a meal.  . lisinopril (PRINIVIL,ZESTRIL) 5 MG tablet Take 1 tablet by mouth daily.  . metoprolol tartrate (LOPRESSOR) 25 MG tablet Take 0.5 tablets (12.5 mg total) by mouth 2 (two) times daily.  . Vitamin D, Ergocalciferol, (DRISDOL) 50000 UNITS CAPS capsule Take 50,000 Units by mouth every 7 (seven) days. Wednesday  . XARELTO 15 MG TABS tablet Take 15 mg by mouth daily.   . [DISCONTINUED] lisinopril (PRINIVIL,ZESTRIL) 10 MG tablet Take 5 mg by mouth daily.  . nitroGLYCERIN (NITROSTAT) 0.4 MG SL tablet Place 1 tablet (0.4 mg total) under the tongue every 5 (five) minutes as needed. For chest pain (Patient not taking: Reported on 05/10/2015)   No facility-administered encounter medications on file as of 05/11/2015.    Activities of Daily Living In your present state of health, do you have any difficulty performing the following activities: 05/11/2015 11/01/2014  Hearing? N -  Vision? N -  Difficulty concentrating or making decisions? N -  Walking or climbing stairs? N -  Dressing or bathing? N -  Doing  errands, shopping? N N    Patient Care Team: Rosalita Chessman, DO as PCP - General Tracie Harrier, MD as Consulting Physician (Nephrology) Amy Kalman Drape, MD as Referring Physician (Audiology) Leonie Man, MD as Consulting Physician (Cardiology) Devra Dopp, MD as Referring Physician (Dermatology)    Assessment:   cpe Exercise Activities and Dietary recommendations---     Goals    None     Fall Risk Fall Risk  05/11/2015 12/25/2014 11/13/2013 05/27/2012  Falls in the past year? No No No No   Depression Screen PHQ 2/9 Scores 05/11/2015 12/25/2014 11/13/2013 05/27/2012  PHQ - 2 Score 0 0 0 1     Cognitive Testing mmse 30/30  Immunization History  Administered Date(s) Administered  . Influenza Split 03/27/2011  . Influenza Whole 03/23/2010  . Influenza, High Dose Seasonal PF 02/17/2014  . Influenza,inj,Quad PF,36+ Mos 03/24/2013  . Influenza-Unspecified 01/28/2015  . Pneumococcal Polysaccharide-23 12/02/1997  . Td 12/26/2007   Screening Tests Health Maintenance  Topic Date Due  . ZOSTAVAX  05/10/2016 (Originally 04/20/1989)  . PNA vac Low Risk Adult (2 of 2 - PCV13) 05/10/2016 (Originally 12/03/1998)  . INFLUENZA VACCINE  12/28/2015  . TETANUS/TDAP  12/25/2017  . DEXA SCAN  Completed      Plan:  see AVS During the course of the visit the patient was educated and counseled about the following appropriate screening and preventive services:   Vaccines to include Pneumoccal, Influenza, Hepatitis B, Td, Zostavax, HCV  Electrocardiogram  Cardiovascular Disease  Colorectal cancer screening  Bone density screening  Diabetes screening  Glaucoma screening  Mammography/PAP  Nutrition counseling   Patient Instructions (the written plan) was given to the patient.  1. Essential hypertension stable - Comp Met (CMET) - CBC with Differential/Platelet - Lipid panel - POCT urinalysis dipstick  2. Paroxysmal atrial fibrillation ([place]) Per  cardiology - Comp Met (CMET) - CBC with Differential/Platelet - Lipid panel - POCT urinalysis dipstick  3. Osteoporosis   - DG Bone Density; Future  Garnet Koyanagi, DO  05/11/2015

## 2015-05-26 ENCOUNTER — Encounter: Payer: Self-pay | Admitting: Cardiology

## 2015-05-26 ENCOUNTER — Ambulatory Visit (INDEPENDENT_AMBULATORY_CARE_PROVIDER_SITE_OTHER): Payer: Medicare Other | Admitting: Cardiology

## 2015-05-26 VITALS — BP 140/70 | HR 68 | Ht 60.0 in | Wt 135.0 lb

## 2015-05-26 DIAGNOSIS — I5032 Chronic diastolic (congestive) heart failure: Secondary | ICD-10-CM

## 2015-05-26 DIAGNOSIS — I480 Paroxysmal atrial fibrillation: Secondary | ICD-10-CM | POA: Diagnosis not present

## 2015-05-26 DIAGNOSIS — I10 Essential (primary) hypertension: Secondary | ICD-10-CM | POA: Diagnosis not present

## 2015-05-26 MED ORDER — RIVAROXABAN 15 MG PO TABS: 15.0000 mg | ORAL_TABLET | Freq: Every day | ORAL | Status: AC

## 2015-05-26 MED ORDER — DRONEDARONE HCL 400 MG PO TABS: 400.0000 mg | ORAL_TABLET | Freq: Two times a day (BID) | ORAL | Status: DC

## 2015-05-26 MED ORDER — XARELTO 15 MG PO TABS: 15.0000 mg | ORAL_TABLET | Freq: Every day | ORAL | Status: DC

## 2015-05-26 MED ORDER — METOPROLOL TARTRATE 25 MG PO TABS: 12.5000 mg | ORAL_TABLET | Freq: Two times a day (BID) | ORAL | Status: DC

## 2015-05-26 MED ORDER — LISINOPRIL 5 MG PO TABS: 5.0000 mg | ORAL_TABLET | Freq: Every day | ORAL | Status: DC

## 2015-05-26 NOTE — Progress Notes (Signed)
PCP: Garnet Koyanagi, DO  Dr. Yol[facility] - [phone]  Clinic Note: Chief Complaint  Patient presents with  . 6 MONTHS  . Shortness of Breath  . Atrial Fibrillation    HPI: Allison Sellers is a 81 y.o. female with history of HTN, PAF (on Xarelto, Multaq), chronic diastolic CHF, CKD stage III, interstitial lung disease noted on remote CT in 02/2011 (prior to antiarrhythmic rx of afib) who presents for post-hospital follow-up. She was recently seen in the ER 11/20/14 with first episode of breakthrough atrial fib.  She presents today for ~6 month f/u. I saw her in April 2016 - BB dose reduced due to fatigue. Diverticular bleed in early June 2016.  Allison Sellers was last seen in June 2016 by Melina Copa for Hospital f/u - doing well. Recent Hospitalizations: 11/20/14: 1st Episode of breakthrough Afib -- DCCV in ER.    Studies Reviewed: none  Interval History: Allison Sellers presents today really doing relatively well without any significant symptoms. She recently visited a 1 her other daughters on and there was some discussion that she was having some exertional dyspnea. As it turns out, the daughter spends more time with her suggested that this is really baseline and no worse than it had been. She denies any significant exertional dyspnea but she overdoes it. There was some question of whether this could be related to her in residual lung disease that I not sure whether this has been treated.   She denies any recurrence of A. Fib symptoms: rapid or irregular heartbeat/palpitations.  No chest pain  with rest or exertion. No PND, orthopnea or edema.  No palpitations, lightheadedness, dizziness, weakness or syncope/near syncope. No TIA/amaurosis fugax symptoms. No claudication.  ROS: A comprehensive was performed. Review of Systems  Constitutional: Negative for malaise/fatigue.  HENT: Negative for nosebleeds.   Respiratory: Negative for cough.   Gastrointestinal: Negative for  blood in stool and melena.  Genitourinary: Negative for hematuria.  Musculoskeletal: Negative.   Neurological: Negative for weakness.  Psychiatric/Behavioral: Negative.   All other systems reviewed and are negative.  Past Medical History  Diagnosis Date  . Hypertension   . Osteopenia   . PAF (paroxysmal atrial fibrillation) ([place])   . Seborrheic keratosis 06/2014    of hand.   . Chronic diastolic CHF (congestive heart failure) ([place])   . CKD (chronic kidney disease), stage III   . Diverticular disease   . Interstitial lung disease ([place])     a. Noted on CT scan 02/2011 before initiation of any antiarrhythmic meds.    Past Surgical History  Procedure Laterality Date  . Umbilical hernia repair  ? 2013  . Cardioversion  04/18/2011    Procedure: CARDIOVERSION;  Surgeon: Leonie Man;  Location: [facility] OR;  Service: Cardiovascular;  Laterality: N/A;  . Lexiscan myoview  04/14/2011    No scintigraphic evidence of inducible myocardial ischemia, EKG negative for ischemia, patient developed Atrial Fibrillation with rapid ventricular response during stress and persisted in the recovery period, abnormal myocardial perfusion study  . 2d echocardiogram  03/29/2011    EF 55-65%, normal  . Colonoscopy N/A 11/03/2014    Procedure: COLONOSCOPY;  Surgeon: Ladene Artist, MD;  Location: [facility] ENDOSCOPY;  Service: Endoscopy;  Laterality: N/A;  . Cardioversion N/A 11/20/2014    Procedure: CARDIOVERSION;  Surgeon: Jolaine Artist, MD;  Location: [facility] OR;  Service: Cardiovascular;  Laterality: N/A;    Prior to Admission medications   Medication Sig Start Date End Date  Taking? Authorizing Provider  dronedarone (MULTAQ) 400 MG tablet Take 1 tablet (400 mg total) by mouth 2 (two) times daily with a meal. 09/24/14   Leonie Man, MD  lisinopril (PRINIVIL,ZESTRIL) 5 MG tablet Take 1 tablet by mouth daily. 04/29/15   Historical Provider, MD  metoprolol tartrate (LOPRESSOR) 25 MG tablet Take 0.5 tablets (12.5 mg  total) by mouth 2 (two) times daily. 09/24/14   Leonie Man, MD  nitroGLYCERIN (NITROSTAT) 0.4 MG SL tablet Place 1 tablet (0.4 mg total) under the tongue every 5 (five) minutes as needed. For chest pain Patient not taking: Reported on 05/10/2015 09/19/13   Leonie Man, MD  Vitamin D, Ergocalciferol, (DRISDOL) 50000 UNITS CAPS capsule Take 50,000 Units by mouth every 7 (seven) days. Wednesday    Historical Provider, MD  XARELTO 15 MG TABS tablet Take 15 mg by mouth daily.  09/24/14   Historical Provider, MD   Allergies  Allergen Reactions  . Aspirin Other (See Comments)    Cannot have due to Xarelto and previous bleed  . Ciprofloxacin Other (See Comments)    Cant be given with her multaq    Social History   Social History  . Marital Status: Widowed    Spouse Name: N/A  . Number of Children: N/A  . Years of Education: N/A   Social History Main Topics  . Smoking status: Never Smoker   . Smokeless tobacco: Never Used  . Alcohol Use: No  . Drug Use: No  . Sexual Activity: Not Currently   Other Topics Concern  . None   Social History Narrative   She is a widowed mother of 54, grandmother of 84, great grandmother of 1. She is usually very active and likes to walk .    recently.    She quit smoking 50 years ago. She denies any alcohol.    She says in order to try to make up for the fact she has not been doing much exercise she has been walking up and down the stairs at the house and staying as active as possible, just doing whatever activity she can do.     Family History  Problem Relation Age of Onset  . Arthritis    . Heart failure Father     CHF  . Heart failure Mother     CHF  . Macular degeneration Mother     Wt Readings from Last 3 Encounters:  05/26/15 135 lb (61.236 kg)  05/11/15 136 lb 6.4 oz (61.871 kg)  12/25/14 134 lb (60.782 kg)    PHYSICAL EXAM BP 140/70 mmHg  Pulse 68  Ht 5' (1.524 m)  Wt 135 lb (61.236 kg)  BMI 26.37 kg/m2 General appearance:  alert, cooperative, appears stated age, no distress and otherwise healthy-appearing. Normal mood and affect. Neck: no adenopathy, no carotid bruit and no JVD Lungs: clear to auscultation bilaterally, normal percussion bilaterally and non-labored Heart: regular rate and rhythm, S1 & S2 normal, no click, rub or gallop; soft 1/6 at [facility] Abdomen: soft, non-tender; bowel sounds normal; no masses,  no organomegaly; no HJR Extremities: extremities normal, atraumatic, no cyanosis, or edema  Pulses: 2+ and symmetric; Skin: mobility and turgor normal Neurologic: Mental status: Alert, oriented, thought content appropriate Cranial nerves: normal (II-XII grossly intact)    Adult ECG Report  Rate: 68 ;  Rhythm: normal sinus rhythm; normal axis and intervals lesions.  Narrative Interpretation: normal EKG.  Other studies Reviewed: Additional studies/ records that were reviewed  today include:  Recent Labs:   Lab Results  Component Value Date   CHOL 128 05/11/2015   HDL 56.10 05/11/2015   LDLCALC 52 05/11/2015   TRIG 97.0 05/11/2015   CHOLHDL 2 05/11/2015   Lab Results  Component Value Date   CREATININE 1.46* 05/11/2015  -- being followed by [facility].  ASSESSMENT / PLAN: Problem List Items Addressed This Visit    Paroxysmal atrial fibrillation - controlled with Multaq and metoprolol (Chronic)   Relevant Medications   lisinopril (PRINIVIL,ZESTRIL) 5 MG tablet   metoprolol tartrate (LOPRESSOR) 25 MG tablet   dronedarone (MULTAQ) 400 MG tablet   Rivaroxaban (XARELTO) 15 MG TABS tablet   Other Relevant Orders   EKG 12-Lead (Completed)   PAF (paroxysmal atrial fibrillation) (HCC) (Chronic)    As far as I can tell, she has not had any further recurrence of A. Fib. She remains on Multaq for rhythm control as well as beta blocker for rate control. Some exertional dyspnea may be related to beta blocker, however is also related to interstitial lung disease.  She is on Xarelto  for anticoagulation.no bleeding issues.      Relevant Medications   lisinopril (PRINIVIL,ZESTRIL) 5 MG tablet   metoprolol tartrate (LOPRESSOR) 25 MG tablet   dronedarone (MULTAQ) 400 MG tablet   Rivaroxaban (XARELTO) 15 MG TABS tablet   Essential hypertension - Primary (Chronic)    Borderline control today. I'm reluctant to make any significant changes, we'll consider increasing lisinopril to 10 mg and one for her labile creatinine levels. -- Will defer to her nephrologist.      Relevant Medications   lisinopril (PRINIVIL,ZESTRIL) 5 MG tablet   metoprolol tartrate (LOPRESSOR) 25 MG tablet   dronedarone (MULTAQ) 400 MG tablet   Rivaroxaban (XARELTO) 15 MG TABS tablet   Other Relevant Orders   EKG 12-Lead (Completed)   Chronic diastolic CHF (congestive heart failure) (HCC) (Chronic)    No active heart failure symptoms. She is on a beta blocker and ACE inhibitor. Not requiring any diuretics. She is only symptomatic when she goes into A. Fib RVR.      Relevant Medications   lisinopril (PRINIVIL,ZESTRIL) 5 MG tablet   metoprolol tartrate (LOPRESSOR) 25 MG tablet   dronedarone (MULTAQ) 400 MG tablet   Rivaroxaban (XARELTO) 15 MG TABS tablet   Other Relevant Orders   EKG 12-Lead (Completed)      Current medicines are reviewed at length with the patient today. (+/- concerns) none The following changes have been made: none Studies Ordered:   Orders Placed This Encounter  Procedures  . EKG 12-Lead   since followup in May 2017 with Dr. Ellyn Hack.    Leonie Man, M.D., M.S. Interventional Cardiologist   Pager # [phone]

## 2015-05-26 NOTE — Patient Instructions (Signed)
NO CHANGE IN CURRENT MEDICATIONS   Your physician wants you to follow-up in May 2017 with Dr Ellyn Hack. You will receive a reminder letter in the mail two months in advance. If you don't receive a letter, please call our office to schedule the follow-up appointment.  If you need a refill on your cardiac medications before your next appointment, please call your pharmacy.

## 2015-05-27 ENCOUNTER — Encounter: Payer: Self-pay | Admitting: Cardiology

## 2015-05-27 NOTE — Assessment & Plan Note (Signed)
Borderline control today. I'm reluctant to make any significant changes, we'll consider increasing lisinopril to 10 mg and one for her labile creatinine levels. -- Will defer to her nephrologist.

## 2015-05-27 NOTE — Assessment & Plan Note (Signed)
No active heart failure symptoms. She is on a beta blocker and ACE inhibitor. Not requiring any diuretics. She is only symptomatic when she goes into A. Fib RVR.

## 2015-05-27 NOTE — Assessment & Plan Note (Signed)
As far as I can tell, she has not had any further recurrence of A. Fib. She remains on Multaq for rhythm control as well as beta blocker for rate control. Some exertional dyspnea may be related to beta blocker, however is also related to interstitial lung disease.  She is on Xarelto for anticoagulation.no bleeding issues.

## 2015-06-28 ENCOUNTER — Encounter (HOSPITAL_COMMUNITY): Payer: Self-pay | Admitting: Nurse Practitioner

## 2015-06-28 ENCOUNTER — Ambulatory Visit (HOSPITAL_COMMUNITY)
Admission: RE | Admit: 2015-06-28 | Discharge: 2015-06-28 | Disposition: A | Discharge status: Home / Self Care | Payer: Medicare Other | Source: Ambulatory Visit | Attending: Nurse Practitioner | Admitting: Nurse Practitioner

## 2015-06-28 ENCOUNTER — Telehealth: Payer: Self-pay | Admitting: Cardiology

## 2015-06-28 VITALS — BP 128/78 | HR 107 | Ht 60.0 in | Wt 135.4 lb

## 2015-06-28 DIAGNOSIS — I481 Persistent atrial fibrillation: Secondary | ICD-10-CM | POA: Insufficient documentation

## 2015-06-28 DIAGNOSIS — I480 Paroxysmal atrial fibrillation: Secondary | ICD-10-CM

## 2015-06-28 NOTE — Telephone Encounter (Signed)
Pt c/o BP issue: STAT if pt c/o blurred vision, one-sided weakness or slurred speech  1. What are your last 5 BP readings?  This morning:  135/94   Hr 32  2. Are you having any other symptoms (ex. Dizziness, headache, blurred vision, passed out)? No  3. What is your BP issue? High BP per daughter calling   Daughter spoke to PA over the weekend and was told to call office this morning to speak to nurse for pt to possibly come in and get an ekg and adjust her meds.

## 2015-06-28 NOTE — Telephone Encounter (Signed)
Returned call. Pt c/o weakness, fast HR over weekend. Daughter had contacted PA on-call over weekend who recommended doubling metoprolol from 12.5mg  BID to 25mg  BID & go to ED if worse symptoms.  Daughter notes they were instructed to monitor pt's BP in context of increased metoprolol dose as well as HR changes.  Pt's rate has been 88-135 when checked over weekend.  This AM, BP 135/94, HR 132.  Pt currently denies SOB, increased fatigue or weakness.   Informed daughter of options, A Fib clinic visit scheduled this afternoon. Daughter given instruction and directions for clinic. Aware if pt develops worse symptoms, still appropriate to defer to ED. If unsure she is welcome to call.

## 2015-06-28 NOTE — Progress Notes (Signed)
Patient ID: Allison Sellers, female   DOB: 08/23/1926, 82 y.o.   MRN: [id number]     Primary Care Physician: Garnet Koyanagi, DO Referring Physician:[facility] triage   Allison Sellers is a 82 y.o. female with a h/o PAF on multaq and metoprolol, with persistent afib since Friday at which time v rates were 125 bpm range.  The last time she had breakthrough afib was last June at which time she required cardioversion, 3 previous cardioversions prior to this. She does have interstitial lung disease which would  make her not  an ideal candidate for amiodarone use. She does have CKD which kidney function, whichj would significantly limit dose of tiksoyn to the effect it may not be effective. She is not a ablation candidate due to age. Also not a flecainide candidate due to renal function. She reports that she does not feel bad in afib and for most part since increase of metoprolol to 25 mg bid over the weekend, she has decent rate control and  BP is OK with higher dose . She is on lower dose of xarelto at 15 mg with cr cl calc at 31.0 ml/min.  Today, she denies symptoms of palpitations, chest pain, shortness of breath, orthopnea, PND, lower extremity edema, dizziness, presyncope, syncope, or neurologic sequela. The patient is tolerating medications without difficulties and is otherwise without complaint today.   Past Medical History  Diagnosis Date  . Hypertension   . Osteopenia   . PAF (paroxysmal atrial fibrillation) ([place])   . Seborrheic keratosis 06/2014    of hand.   . Chronic diastolic CHF (congestive heart failure) ([place])   . CKD (chronic kidney disease), stage III   . Diverticular disease   . Interstitial lung disease ([place])     a. Noted on CT scan 02/2011 before initiation of any antiarrhythmic meds.   Past Surgical History  Procedure Laterality Date  . Umbilical hernia repair  ? 2013  . Cardioversion  04/18/2011    Procedure: CARDIOVERSION;  Surgeon: Leonie Man;  Location: [facility] OR;  Service:  Cardiovascular;  Laterality: N/A;  . Lexiscan myoview  04/14/2011    No scintigraphic evidence of inducible myocardial ischemia, EKG negative for ischemia, patient developed Atrial Fibrillation with rapid ventricular response during stress and persisted in the recovery period, abnormal myocardial perfusion study  . 2d echocardiogram  03/29/2011    EF 55-65%, normal  . Colonoscopy N/A 11/03/2014    Procedure: COLONOSCOPY;  Surgeon: Ladene Artist, MD;  Location: [facility] ENDOSCOPY;  Service: Endoscopy;  Laterality: N/A;  . Cardioversion N/A 11/20/2014    Procedure: CARDIOVERSION;  Surgeon: Jolaine Artist, MD;  Location: [facility] OR;  Service: Cardiovascular;  Laterality: N/A;    Current Outpatient Prescriptions  Medication Sig Dispense Refill  . dronedarone (MULTAQ) 400 MG tablet Take 1 tablet (400 mg total) by mouth 2 (two) times daily with a meal. 180 tablet 3  . lisinopril (PRINIVIL,ZESTRIL) 5 MG tablet Take 1 tablet (5 mg total) by mouth daily. 90 tablet 3  . metoprolol tartrate (LOPRESSOR) 25 MG tablet Take 0.5 tablets (12.5 mg total) by mouth 2 (two) times daily. (Patient taking differently: Take 25 mg by mouth daily. ) 90 tablet 3  . nitroGLYCERIN (NITROSTAT) 0.4 MG SL tablet Place 1 tablet (0.4 mg total) under the tongue every 5 (five) minutes as needed. For chest pain 25 tablet 6  . Rivaroxaban (XARELTO) 15 MG TABS tablet Take 1 tablet (15 mg total) by mouth daily. 90 tablet 3  .  Vitamin D, Ergocalciferol, (DRISDOL) 50000 UNITS CAPS capsule Take 50,000 Units by mouth every 7 (seven) days. Wednesday    . [DISCONTINUED] digoxin (LANOXIN) 0.125 MG tablet Take 125 mcg by mouth daily.       No current facility-administered medications for this encounter.    Allergies  Allergen Reactions  . Aspirin Other (See Comments)    Cannot have due to Xarelto and previous bleed  . Ciprofloxacin Other (See Comments)    Cant be given with her multaq    Social History   Social History  . Marital Status:  Widowed    Spouse Name: N/A  . Number of Children: N/A  . Years of Education: N/A   Occupational History  . Not on file.   Social History Main Topics  . Smoking status: Never Smoker   . Smokeless tobacco: Never Used  . Alcohol Use: No  . Drug Use: No  . Sexual Activity: Not Currently   Other Topics Concern  . Not on file   Social History Narrative   She is a widowed mother of 73, grandmother of 33, great grandmother of 1. She is usually very active and likes to walk .    recently.    She quit smoking 50 years ago. She denies any alcohol.    She says in order to try to make up for the fact she has not been doing much exercise she has been walking up and down the stairs at the house and staying as active as possible, just doing whatever activity she can do.     Family History  Problem Relation Age of Onset  . Arthritis    . Heart failure Father     CHF  . Heart failure Mother     CHF  . Macular degeneration Mother     ROS- All systems are reviewed and negative except as per the HPI above  Physical Exam: Filed Vitals:   06/28/15 1509  BP: 128/78  Pulse: 107  Height: 5' (1.524 m)  Weight: 135 lb 6.4 oz (61.417 kg)    GEN- The patient is well appearing, alert and oriented x 3 today.   Head- normocephalic, atraumatic Eyes-  Sclera clear, conjunctiva pink Ears- hearing intact Oropharynx- clear Neck- supple, no JVP Lymph- no cervical lymphadenopathy Lungs- Clear to ausculation bilaterally, normal work of breathing Heart- Regular rate and rhythm, no murmurs, rubs or gallops, PMI not laterally displaced GI- soft, NT, ND, + BS Extremities- no clubbing, cyanosis, or edema MS- no significant deformity or atrophy Skin- no rash or lesion Psych- euthymic mood, full affect Neuro- strength and sensation are intact  EKG- afib at 107 bpm, qrs int 86 bpm, qtc 427 ms.  Epic records reviewed  Assessment and Plan: 1. Persisitent afib since 1/28.  Continue multaq and  metoprolol 25 mg bid Will give her a few more days on this regime and see if she will spontaneously convert. She will return Thursday and will discuss cardioversion if she wishes  Do not miss xarelto doses She may at some point be looking at  rate control, especially if she continues to be  minimally symptomatic  Butch Penny C. Irish Piech, [facility] [address] [place], [place] [address] [phone]

## 2015-06-30 ENCOUNTER — Ambulatory Visit (HOSPITAL_COMMUNITY)
Admission: RE | Admit: 2015-06-30 | Discharge: 2015-06-30 | Disposition: A | Discharge status: Home / Self Care | Payer: Medicare Other | Source: Ambulatory Visit | Attending: Nurse Practitioner | Admitting: Nurse Practitioner

## 2015-06-30 VITALS — BP 122/74 | HR 99 | Ht 60.0 in | Wt 135.0 lb

## 2015-06-30 DIAGNOSIS — Z886 Allergy status to analgesic agent status: Secondary | ICD-10-CM | POA: Insufficient documentation

## 2015-06-30 DIAGNOSIS — I130 Hypertensive heart and chronic kidney disease with heart failure and stage 1 through stage 4 chronic kidney disease, or unspecified chronic kidney disease: Secondary | ICD-10-CM | POA: Diagnosis not present

## 2015-06-30 DIAGNOSIS — Z7902 Long term (current) use of antithrombotics/antiplatelets: Secondary | ICD-10-CM | POA: Diagnosis not present

## 2015-06-30 DIAGNOSIS — Z79899 Other long term (current) drug therapy: Secondary | ICD-10-CM | POA: Insufficient documentation

## 2015-06-30 DIAGNOSIS — I480 Paroxysmal atrial fibrillation: Secondary | ICD-10-CM | POA: Diagnosis not present

## 2015-06-30 DIAGNOSIS — Z8249 Family history of ischemic heart disease and other diseases of the circulatory system: Secondary | ICD-10-CM | POA: Insufficient documentation

## 2015-06-30 DIAGNOSIS — N183 Chronic kidney disease, stage 3 (moderate): Secondary | ICD-10-CM | POA: Diagnosis not present

## 2015-06-30 DIAGNOSIS — I481 Persistent atrial fibrillation: Secondary | ICD-10-CM | POA: Diagnosis present

## 2015-06-30 DIAGNOSIS — I5032 Chronic diastolic (congestive) heart failure: Secondary | ICD-10-CM | POA: Diagnosis not present

## 2015-06-30 DIAGNOSIS — Z87891 Personal history of nicotine dependence: Secondary | ICD-10-CM | POA: Insufficient documentation

## 2015-06-30 DIAGNOSIS — Z881 Allergy status to other antibiotic agents status: Secondary | ICD-10-CM | POA: Insufficient documentation

## 2015-06-30 DIAGNOSIS — J849 Interstitial pulmonary disease, unspecified: Secondary | ICD-10-CM | POA: Diagnosis not present

## 2015-06-30 LAB — BASIC METABOLIC PANEL
ANION GAP: 8 (ref 5–15)
BUN: 34 mg/dL — ABNORMAL HIGH (ref 6–20)
CHLORIDE: 102 mmol/L (ref 101–111)
CO2: 25 mmol/L (ref 22–32)
CREATININE: 1.79 mg/dL — AB (ref 0.44–1.00)
Calcium: 9.1 mg/dL (ref 8.9–10.3)
GFR calc non Af Amer: 25 mL/min — ABNORMAL LOW (ref >60)
GFR, EST AFRICAN AMERICAN: 28 mL/min — AB (ref >60)
Glucose, Bld: 113 mg/dL — ABNORMAL HIGH (ref 65–99)
POTASSIUM: 5 mmol/L (ref 3.5–5.1)
SODIUM: 135 mmol/L (ref 135–145)

## 2015-06-30 LAB — CBC
HCT: 42.1 % (ref 36.0–46.0)
HEMOGLOBIN: 14.7 g/dL (ref 12.0–15.0)
MCH: 31.8 pg (ref 26.0–34.0)
MCHC: 34.9 g/dL (ref 30.0–36.0)
MCV: 91.1 fL (ref 78.0–100.0)
Platelets: 382 10*3/uL (ref 150–400)
RBC: 4.62 MIL/uL (ref 3.87–5.11)
RDW: 13.3 % (ref 11.5–15.5)
WBC: 10.6 10*3/uL — AB (ref 4.0–10.5)

## 2015-06-30 NOTE — Patient Instructions (Signed)
Cardioversion scheduled for Thursday, February 16th  - Arrive at the [facility] and go to admitting at [facility] not eat or drink anything after midnight the night prior to your procedure.  - Take all your medication with a sip of water prior to arrival.  - You will not be able to drive home after your procedure.

## 2015-07-01 ENCOUNTER — Encounter (HOSPITAL_COMMUNITY): Payer: Self-pay | Admitting: Nurse Practitioner

## 2015-07-01 NOTE — Progress Notes (Signed)
Patient ID: Allison Sellers, female   DOB: 1926/06/09, 82 y.o.   MRN: [id number]     Primary Care Physician: Garnet Koyanagi, DO Referring Physician:[facility] triage   Allison Sellers is a 82 y.o. female with a h/o PAF on multaq and metoprolol, with persistent afib  For 7-10 days at which time v rates were 125 bpm range.  The last time she had breakthrough afib was last June at which time she required cardioversion, 3 previous cardioversions prior to this. She does have interstitial lung disease which would  make her not  an ideal candidate for amiodarone use. She does have CKD which kidney function, which would significantly limit dose of tiksoyn to the effect it may not be effective. She is not a ablation candidate due to age. Also not a flecainide candidate due to renal function. She reports that she does not feel bad in afib and for most part since increase of metoprolol to 25 mg bid,  she has decent rate control and  BP is OK with higher dose . She is on lower dose of xarelto at 15 mg with cr cl calc at 31.0 ml/min.  She returns today with afib rate controlled. Discussed with Dr. Ellyn Hack and he would like to see if she can be returned to [place], so after  conversation with the pt and daughter, they would like to pursue cardioversion. She thinks she may have missed one dose of xarelto around January 7th, otherwise no other known missed doses. Unfortunately, due to above paragraph, she is not a good candidate for other antiarrhythmics, so will be left on multaq.  Today, she denies symptoms of palpitations, chest pain, shortness of breath, orthopnea, PND, lower extremity edema, dizziness, presyncope, syncope, or neurologic sequela. The patient is tolerating medications without difficulties and is otherwise without complaint today.   Past Medical History  Diagnosis Date  . Hypertension   . Osteopenia   . PAF (paroxysmal atrial fibrillation) ([place])   . Seborrheic keratosis 06/2014    of hand.   . Chronic  diastolic CHF (congestive heart failure) ([place])   . CKD (chronic kidney disease), stage III   . Diverticular disease   . Interstitial lung disease ([place])     a. Noted on CT scan 02/2011 before initiation of any antiarrhythmic meds.   Past Surgical History  Procedure Laterality Date  . Umbilical hernia repair  ? 2013  . Cardioversion  04/18/2011    Procedure: CARDIOVERSION;  Surgeon: Leonie Man;  Location: [facility] OR;  Service: Cardiovascular;  Laterality: N/A;  . Lexiscan myoview  04/14/2011    No scintigraphic evidence of inducible myocardial ischemia, EKG negative for ischemia, patient developed Atrial Fibrillation with rapid ventricular response during stress and persisted in the recovery period, abnormal myocardial perfusion study  . 2d echocardiogram  03/29/2011    EF 55-65%, normal  . Colonoscopy N/A 11/03/2014    Procedure: COLONOSCOPY;  Surgeon: Ladene Artist, MD;  Location: [facility] ENDOSCOPY;  Service: Endoscopy;  Laterality: N/A;  . Cardioversion N/A 11/20/2014    Procedure: CARDIOVERSION;  Surgeon: Jolaine Artist, MD;  Location: [facility] OR;  Service: Cardiovascular;  Laterality: N/A;    Current Outpatient Prescriptions  Medication Sig Dispense Refill  . dronedarone (MULTAQ) 400 MG tablet Take 1 tablet (400 mg total) by mouth 2 (two) times daily with a meal. 180 tablet 3  . lisinopril (PRINIVIL,ZESTRIL) 5 MG tablet Take 1 tablet (5 mg total) by mouth daily. 90 tablet 3  . metoprolol  tartrate (LOPRESSOR) 25 MG tablet Take 0.5 tablets (12.5 mg total) by mouth 2 (two) times daily. (Patient taking differently: Take 25 mg by mouth daily. ) 90 tablet 3  . nitroGLYCERIN (NITROSTAT) 0.4 MG SL tablet Place 1 tablet (0.4 mg total) under the tongue every 5 (five) minutes as needed. For chest pain 25 tablet 6  . Rivaroxaban (XARELTO) 15 MG TABS tablet Take 1 tablet (15 mg total) by mouth daily. 90 tablet 3  . Vitamin D, Ergocalciferol, (DRISDOL) 50000 UNITS CAPS capsule Take 50,000 Units by mouth  every 7 (seven) days. Wednesday    . [DISCONTINUED] digoxin (LANOXIN) 0.125 MG tablet Take 125 mcg by mouth daily.       No current facility-administered medications for this encounter.    Allergies  Allergen Reactions  . Aspirin Other (See Comments)    Cannot have due to Xarelto and previous bleed  . Ciprofloxacin Other (See Comments)    Cant be given with her multaq    Social History   Social History  . Marital Status: Widowed    Spouse Name: N/A  . Number of Children: N/A  . Years of Education: N/A   Occupational History  . Not on file.   Social History Main Topics  . Smoking status: Never Smoker   . Smokeless tobacco: Never Used  . Alcohol Use: No  . Drug Use: No  . Sexual Activity: Not Currently   Other Topics Concern  . Not on file   Social History Narrative   She is a widowed mother of 55, grandmother of 72, great grandmother of 1. She is usually very active and likes to walk .    recently.    She quit smoking 50 years ago. She denies any alcohol.    She says in order to try to make up for the fact she has not been doing much exercise she has been walking up and down the stairs at the house and staying as active as possible, just doing whatever activity she can do.     Family History  Problem Relation Age of Onset  . Arthritis    . Heart failure Father     CHF  . Heart failure Mother     CHF  . Macular degeneration Mother     ROS- All systems are reviewed and negative except as per the HPI above  Physical Exam: Filed Vitals:   06/30/15 1458  BP: 122/74  Pulse: 99  Height: 5' (1.524 m)  Weight: 135 lb (61.236 kg)    GEN- The patient is well appearing, alert and oriented x 3 today.   Head- normocephalic, atraumatic Eyes-  Sclera clear, conjunctiva pink Ears- hearing intact Oropharynx- clear Neck- supple, no JVP Lymph- no cervical lymphadenopathy Lungs- Clear to ausculation bilaterally, normal work of breathing Heart- Regular rate and rhythm,  no murmurs, rubs or gallops, PMI not laterally displaced GI- soft, NT, ND, + BS Extremities- no clubbing, cyanosis, or edema MS- no significant deformity or atrophy Skin- no rash or lesion Psych- euthymic mood, full affect Neuro- strength and sensation are intact  EKG- afib at 107 bpm, qrs int 86 bpm, qtc 427 ms.  Epic records reviewed  Assessment and Plan:  1. Persisitent afib since 1/28.  Continue multaq and metoprolol 25 mg bid Will schedule for cardioversion, 2/16, scheduled out due to transportation issues with family,  and to assure no missed doses x 3 weeks( missed a dose she thinks around 1/7) and not wanting a  TEE. Do not miss xarelto doses going forward   F/u in afib one week after cardioversion  Butch Penny C. Carroll, [facility] [address] [place], [place] [address] [phone]

## 2015-07-01 NOTE — Progress Notes (Signed)
Lm

## 2015-07-02 ENCOUNTER — Other Ambulatory Visit (HOSPITAL_COMMUNITY): Payer: Self-pay | Admitting: *Deleted

## 2015-07-13 ENCOUNTER — Ambulatory Visit (HOSPITAL_COMMUNITY)
Admission: RE | Admit: 2015-07-13 | Discharge: 2015-07-13 | Disposition: A | Discharge status: Home / Self Care | Payer: Medicare Other | Source: Ambulatory Visit | Attending: Nurse Practitioner | Admitting: Nurse Practitioner

## 2015-07-13 DIAGNOSIS — I481 Persistent atrial fibrillation: Secondary | ICD-10-CM

## 2015-07-13 DIAGNOSIS — I4891 Unspecified atrial fibrillation: Secondary | ICD-10-CM | POA: Insufficient documentation

## 2015-07-13 NOTE — Progress Notes (Addendum)
Pt felt to be back in normal rhythm - wanted EKG before scheduled cardioversion on 2/16. Roderic Palau NP to review EKG  Ekg shows SR with PAC's, pr int 144 ms, qrs int 70 ms, qtc 442 ms. She feels well although, I feel by her HR/BP readings at home, she is in and out of afib. Continue multaq for now and metoprolol. States that afib is not really bothering her. Keep f/u appointment 2/22.

## 2015-07-15 ENCOUNTER — Encounter (HOSPITAL_COMMUNITY): Admission: RE | Payer: Self-pay | Source: Ambulatory Visit

## 2015-07-15 ENCOUNTER — Ambulatory Visit (HOSPITAL_COMMUNITY): Admission: RE | Admit: 2015-07-15 | Payer: Medicare Other | Source: Ambulatory Visit | Admitting: Internal Medicine

## 2015-07-15 SURGERY — CARDIOVERSION
Anesthesia: Monitor Anesthesia Care

## 2015-07-21 ENCOUNTER — Encounter (HOSPITAL_COMMUNITY): Payer: Self-pay | Admitting: Nurse Practitioner

## 2015-07-21 ENCOUNTER — Ambulatory Visit (HOSPITAL_COMMUNITY)
Admission: RE | Admit: 2015-07-21 | Discharge: 2015-07-21 | Disposition: A | Discharge status: Home / Self Care | Payer: Medicare Other | Source: Ambulatory Visit | Attending: Nurse Practitioner | Admitting: Nurse Practitioner

## 2015-07-21 VITALS — BP 134/98 | HR 122 | Ht 60.0 in | Wt 134.2 lb

## 2015-07-21 DIAGNOSIS — I129 Hypertensive chronic kidney disease with stage 1 through stage 4 chronic kidney disease, or unspecified chronic kidney disease: Secondary | ICD-10-CM | POA: Insufficient documentation

## 2015-07-21 DIAGNOSIS — Z79899 Other long term (current) drug therapy: Secondary | ICD-10-CM | POA: Diagnosis not present

## 2015-07-21 DIAGNOSIS — Z87891 Personal history of nicotine dependence: Secondary | ICD-10-CM | POA: Diagnosis not present

## 2015-07-21 DIAGNOSIS — Z8249 Family history of ischemic heart disease and other diseases of the circulatory system: Secondary | ICD-10-CM | POA: Diagnosis not present

## 2015-07-21 DIAGNOSIS — Z7902 Long term (current) use of antithrombotics/antiplatelets: Secondary | ICD-10-CM | POA: Diagnosis not present

## 2015-07-21 DIAGNOSIS — Z886 Allergy status to analgesic agent status: Secondary | ICD-10-CM | POA: Insufficient documentation

## 2015-07-21 DIAGNOSIS — J849 Interstitial pulmonary disease, unspecified: Secondary | ICD-10-CM | POA: Insufficient documentation

## 2015-07-21 DIAGNOSIS — I4891 Unspecified atrial fibrillation: Secondary | ICD-10-CM | POA: Diagnosis present

## 2015-07-21 DIAGNOSIS — N183 Chronic kidney disease, stage 3 (moderate): Secondary | ICD-10-CM | POA: Diagnosis not present

## 2015-07-21 MED ORDER — DILTIAZEM HCL ER COATED BEADS 120 MG PO CP24: 120.0000 mg | ORAL_CAPSULE | Freq: Every day | ORAL | Status: DC

## 2015-07-21 NOTE — Patient Instructions (Signed)
Your physician has recommended you make the following change in your medication:  1)Stop Multaq 2)Cardizem (Diltiazem) 120mg  once a day

## 2015-07-21 NOTE — Progress Notes (Signed)
Patient ID: Allison Sellers, female   DOB: 10/23/1926, 82 y.o.   MRN: [id number]     Primary Care Physician: Garnet Koyanagi, DO Referring Physician:[facility] triage   Allison Sellers is a 82 y.o. female with a h/o PAF on multaq and metoprolol, with persistent afib  For 7-10 days at which time v rates were 125 bpm range.  The last time she had breakthrough afib was last June at which time she required cardioversion, 3 previous cardioversions prior to this. She does have interstitial lung disease which would  make her not  an ideal candidate for amiodarone use. She does have CKD, which would significantly limit dose of tiksoyn to the effect it may not be effective. She is not a ablation candidate due to age. Also not a flecainide/sotalol candidate due to renal function. She reports that she does not feel bad in afib and for most part since increase of metoprolol to 25 mg bid,  she has decent rate control and  BP is OK with higher dose . She is on lower dose of xarelto at 15 mg with cr cl calc at 31.0 ml/min.  She returns today with afib rate controlled. Discussed with Dr. Ellyn Hack and he would like to see if she can be returned to [place], so after  conversation with the pt and daughter, they would like to pursue cardioversion. She thinks she may have missed one dose of xarelto around January 7th, otherwise no other known missed doses. Unfortunately, due to above paragraph, she is not a good candidate for other antiarrhythmics, so will be left on multaq.  Called the office right before cardioversion and found to be in SR, so DCCV cancelled. She now returns 2/22 and is back in afib with v rate of 122. She is unaware of afib and most part feels well and the irregular heart beat does not bother her.I think since she is having so much afib, mostly asymptomatic, and not a good candidate for other antiarrythmic's, that multaq should be stopped and will attempt better rate control. She and family are in agreement.She  continues on xarelto.  Today, she denies symptoms of palpitations, chest pain, shortness of breath, orthopnea, PND, lower extremity edema, dizziness, presyncope, syncope, or neurologic sequela. The patient is tolerating medications without difficulties and is otherwise without complaint today.   Past Medical History  Diagnosis Date  . Hypertension   . Osteopenia   . PAF (paroxysmal atrial fibrillation) ([place])   . Seborrheic keratosis 06/2014    of hand.   . Chronic diastolic CHF (congestive heart failure) ([place])   . CKD (chronic kidney disease), stage III   . Diverticular disease   . Interstitial lung disease ([place])     a. Noted on CT scan 02/2011 before initiation of any antiarrhythmic meds.   Past Surgical History  Procedure Laterality Date  . Umbilical hernia repair  ? 2013  . Cardioversion  04/18/2011    Procedure: CARDIOVERSION;  Surgeon: Leonie Man;  Location: [facility] OR;  Service: Cardiovascular;  Laterality: N/A;  . Lexiscan myoview  04/14/2011    No scintigraphic evidence of inducible myocardial ischemia, EKG negative for ischemia, patient developed Atrial Fibrillation with rapid ventricular response during stress and persisted in the recovery period, abnormal myocardial perfusion study  . 2d echocardiogram  03/29/2011    EF 55-65%, normal  . Colonoscopy N/A 11/03/2014    Procedure: COLONOSCOPY;  Surgeon: Ladene Artist, MD;  Location: [facility] ENDOSCOPY;  Service: Endoscopy;  Laterality: N/A;  .  Cardioversion N/A 11/20/2014    Procedure: CARDIOVERSION;  Surgeon: Jolaine Artist, MD;  Location: [facility] OR;  Service: Cardiovascular;  Laterality: N/A;    Current Outpatient Prescriptions  Medication Sig Dispense Refill  . lisinopril (PRINIVIL,ZESTRIL) 5 MG tablet Take 1 tablet (5 mg total) by mouth daily. 90 tablet 3  . metoprolol tartrate (LOPRESSOR) 25 MG tablet Take 0.5 tablets (12.5 mg total) by mouth 2 (two) times daily. (Patient taking differently: Take 25 mg by mouth 2 (two) times  daily. ) 90 tablet 3  . nitroGLYCERIN (NITROSTAT) 0.4 MG SL tablet Place 1 tablet (0.4 mg total) under the tongue every 5 (five) minutes as needed. For chest pain 25 tablet 6  . Rivaroxaban (XARELTO) 15 MG TABS tablet Take 1 tablet (15 mg total) by mouth daily. 90 tablet 3  . Vitamin D, Ergocalciferol, (DRISDOL) 50000 UNITS CAPS capsule Take 50,000 Units by mouth every 7 (seven) days. Wednesday    . diltiazem (CARDIZEM CD) 120 MG 24 hr capsule Take 1 capsule (120 mg total) by mouth daily. 30 capsule 3  . [DISCONTINUED] digoxin (LANOXIN) 0.125 MG tablet Take 125 mcg by mouth daily.       No current facility-administered medications for this encounter.    Allergies  Allergen Reactions  . Aspirin Other (See Comments)    Cannot have due to Xarelto and previous bleed  . Ciprofloxacin Other (See Comments)    Cant be given with her multaq    Social History   Social History  . Marital Status: Widowed    Spouse Name: N/A  . Number of Children: N/A  . Years of Education: N/A   Occupational History  . Not on file.   Social History Main Topics  . Smoking status: Never Smoker   . Smokeless tobacco: Never Used  . Alcohol Use: No  . Drug Use: No  . Sexual Activity: Not Currently   Other Topics Concern  . Not on file   Social History Narrative   She is a widowed mother of 87, grandmother of 10, great grandmother of 1. She is usually very active and likes to walk .    recently.    She quit smoking 50 years ago. She denies any alcohol.    She says in order to try to make up for the fact she has not been doing much exercise she has been walking up and down the stairs at the house and staying as active as possible, just doing whatever activity she can do.     Family History  Problem Relation Age of Onset  . Arthritis    . Heart failure Father     CHF  . Heart failure Mother     CHF  . Macular degeneration Mother     ROS- All systems are reviewed and negative except as per the HPI  above  Physical Exam: Filed Vitals:   07/21/15 1000  BP: 134/98  Pulse: 122  Height: 5' (1.524 m)  Weight: 134 lb 3.2 oz (60.873 kg)    GEN- The patient is well appearing, alert and oriented x 3 today.   Head- normocephalic, atraumatic Eyes-  Sclera clear, conjunctiva pink Ears- hearing intact Oropharynx- clear Neck- supple, no JVP Lymph- no cervical lymphadenopathy Lungs- Clear to ausculation bilaterally, normal work of breathing Heart- Rapid, irregular rate and rhythm, no murmurs, rubs or gallops, PMI not laterally displaced GI- soft, NT, ND, + BS Extremities- no clubbing, cyanosis, or edema MS- no significant deformity or atrophy  Skin- no rash or lesion Psych- euthymic mood, full affect Neuro- strength and sensation are intact  EKG- afib at 122 bpm, qrs int 82 bpm, qtc 393 ms.  Epic records reviewed  Assessment and Plan:  1. Afib, paroxysmal/persistent mixed, mostly asymptomatic Pt is not a good candidate for other antiarrhythmics as discussed in HPI Will stop multaq due to frequency of afib and not maintaining SR Add cardizem 120 mg qd and aim for rate control Continue metoprolol 25 mg bid Continue xareltro Watch for low BP with cardizem and report to office if occurs   F/u in afib one week   Butch Penny C. Ahmyah Gidley, [facility] [address] [place], [place] [address] [phone]

## 2015-07-28 ENCOUNTER — Encounter (HOSPITAL_COMMUNITY): Payer: Self-pay | Admitting: Nurse Practitioner

## 2015-07-28 ENCOUNTER — Ambulatory Visit (HOSPITAL_COMMUNITY)
Admission: RE | Admit: 2015-07-28 | Discharge: 2015-07-28 | Disposition: A | Discharge status: Home / Self Care | Payer: Medicare Other | Source: Ambulatory Visit | Attending: Nurse Practitioner | Admitting: Nurse Practitioner

## 2015-07-28 VITALS — BP 128/70 | HR 122 | Ht 60.0 in | Wt 136.6 lb

## 2015-07-28 DIAGNOSIS — N183 Chronic kidney disease, stage 3 (moderate): Secondary | ICD-10-CM | POA: Insufficient documentation

## 2015-07-28 DIAGNOSIS — Z881 Allergy status to other antibiotic agents status: Secondary | ICD-10-CM | POA: Diagnosis not present

## 2015-07-28 DIAGNOSIS — J849 Interstitial pulmonary disease, unspecified: Secondary | ICD-10-CM | POA: Diagnosis not present

## 2015-07-28 DIAGNOSIS — I481 Persistent atrial fibrillation: Secondary | ICD-10-CM | POA: Diagnosis not present

## 2015-07-28 DIAGNOSIS — M8580 Other specified disorders of bone density and structure, unspecified site: Secondary | ICD-10-CM | POA: Diagnosis not present

## 2015-07-28 DIAGNOSIS — Z87891 Personal history of nicotine dependence: Secondary | ICD-10-CM | POA: Insufficient documentation

## 2015-07-28 DIAGNOSIS — I130 Hypertensive heart and chronic kidney disease with heart failure and stage 1 through stage 4 chronic kidney disease, or unspecified chronic kidney disease: Secondary | ICD-10-CM | POA: Insufficient documentation

## 2015-07-28 DIAGNOSIS — Z79899 Other long term (current) drug therapy: Secondary | ICD-10-CM | POA: Diagnosis not present

## 2015-07-28 DIAGNOSIS — Z8249 Family history of ischemic heart disease and other diseases of the circulatory system: Secondary | ICD-10-CM | POA: Diagnosis not present

## 2015-07-28 DIAGNOSIS — I5032 Chronic diastolic (congestive) heart failure: Secondary | ICD-10-CM | POA: Diagnosis not present

## 2015-07-28 DIAGNOSIS — I480 Paroxysmal atrial fibrillation: Secondary | ICD-10-CM | POA: Diagnosis present

## 2015-07-28 DIAGNOSIS — Z7902 Long term (current) use of antithrombotics/antiplatelets: Secondary | ICD-10-CM | POA: Insufficient documentation

## 2015-07-28 DIAGNOSIS — Z886 Allergy status to analgesic agent status: Secondary | ICD-10-CM | POA: Insufficient documentation

## 2015-07-28 LAB — BASIC METABOLIC PANEL
Anion gap: 9 (ref 5–15)
BUN: 17 mg/dL (ref 6–20)
CHLORIDE: 107 mmol/L (ref 101–111)
CO2: 23 mmol/L (ref 22–32)
CREATININE: 1.32 mg/dL — AB (ref 0.44–1.00)
Calcium: 9.3 mg/dL (ref 8.9–10.3)
GFR calc Af Amer: 41 mL/min — ABNORMAL LOW (ref >60)
GFR calc non Af Amer: 35 mL/min — ABNORMAL LOW (ref >60)
GLUCOSE: 94 mg/dL (ref 65–99)
Potassium: 5.1 mmol/L (ref 3.5–5.1)
Sodium: 139 mmol/L (ref 135–145)

## 2015-07-28 NOTE — Progress Notes (Signed)
Patient ID: Allison Sellers, female   DOB: 18-Sep-1926, 82 y.o.   MRN: [id number]     Primary Care Physician: Garnet Koyanagi, DO Referring Physician:[facility] triage   Allison Sellers is a 82 y.o. female with a h/o PAF on multaq and metoprolol, with persistent afib  for 7-10 days at which time v rates were 125 bpm range.  The last time she had breakthrough afib was last June at which time she required cardioversion, 3 previous cardioversions prior to this. She does have interstitial lung disease which would  make her not  an ideal candidate for amiodarone use. She does have CKD, which would significantly limit dose of tiksoyn to the effect it may not be effective. She is not a ablation candidate due to age. Also not a flecainide/sotalol candidate due to renal function. She reports that she does not feel bad in afib and for most part since increase of metoprolol to 25 mg bid,  she has decent rate control and  BP is OK with higher dose . She is on lower dose of xarelto at 15 mg with cr cl calc at 31.0 ml/min.  She returns today with afib rate controlled. Discussed with Dr. Ellyn Hack and he would like to see if she can be returned to [place], so after  conversation with the pt and daughter, they would like to pursue cardioversion. She thinks she may have missed one dose of xarelto around January 7th, otherwise no other known missed doses. Unfortunately, due to above paragraph, she is not a good candidate for other antiarrhythmics, so will be left on multaq.  Called the office right before cardioversion and found to be in SR, so DCCV cancelled. She now returns 2/22 and is back in afib with v rate of 122. She is unaware of afib and most part feels well and the irregular heart beat does not bother her.I think since she is having so much afib, mostly asymptomatic, and not a good candidate for other antiarrythmic's, that multaq should be stopped and will attempt better rate control. She and family are in agreement.She  continues on xarelto.  Seen back in afib office 3/1. Last visit cardizem 120 mg qd was added and  multaq was stopped to aim for rate control. She continues to feel well. Does not notice afib. Able to do her house hold activities and occasionally will feel the need to rest for about 10 mins if she gets tired. No heart failure symptoms. She brings in her numbers form home and for the most part are 100 or less with some readings 65-70. Her heart rate is elevated here but she says that she is nervous on her visits.   Today, she denies symptoms of palpitations, chest pain, shortness of breath, orthopnea, PND, lower extremity edema, dizziness, presyncope, syncope, or neurologic sequela. The patient is tolerating medications without difficulties and is otherwise without complaint today.   Past Medical History  Diagnosis Date  . Hypertension   . Osteopenia   . PAF (paroxysmal atrial fibrillation) ([place])   . Seborrheic keratosis 06/2014    of hand.   . Chronic diastolic CHF (congestive heart failure) ([place])   . CKD (chronic kidney disease), stage III   . Diverticular disease   . Interstitial lung disease ([place])     a. Noted on CT scan 02/2011 before initiation of any antiarrhythmic meds.   Past Surgical History  Procedure Laterality Date  . Umbilical hernia repair  ? 2013  . Cardioversion  04/18/2011  Procedure: CARDIOVERSION;  Surgeon: Leonie Man;  Location: [facility] OR;  Service: Cardiovascular;  Laterality: N/A;  . Lexiscan myoview  04/14/2011    No scintigraphic evidence of inducible myocardial ischemia, EKG negative for ischemia, patient developed Atrial Fibrillation with rapid ventricular response during stress and persisted in the recovery period, abnormal myocardial perfusion study  . 2d echocardiogram  03/29/2011    EF 55-65%, normal  . Colonoscopy N/A 11/03/2014    Procedure: COLONOSCOPY;  Surgeon: Ladene Artist, MD;  Location: [facility] ENDOSCOPY;  Service: Endoscopy;  Laterality: N/A;  .  Cardioversion N/A 11/20/2014    Procedure: CARDIOVERSION;  Surgeon: Jolaine Artist, MD;  Location: [facility] OR;  Service: Cardiovascular;  Laterality: N/A;    Current Outpatient Prescriptions  Medication Sig Dispense Refill  . diltiazem (CARDIZEM CD) 120 MG 24 hr capsule Take 1 capsule (120 mg total) by mouth daily. 30 capsule 3  . lisinopril (PRINIVIL,ZESTRIL) 5 MG tablet Take 1 tablet (5 mg total) by mouth daily. 90 tablet 3  . metoprolol tartrate (LOPRESSOR) 25 MG tablet Take 0.5 tablets (12.5 mg total) by mouth 2 (two) times daily. (Patient taking differently: Take 25 mg by mouth 2 (two) times daily. ) 90 tablet 3  . nitroGLYCERIN (NITROSTAT) 0.4 MG SL tablet Place 1 tablet (0.4 mg total) under the tongue every 5 (five) minutes as needed. For chest pain 25 tablet 6  . Rivaroxaban (XARELTO) 15 MG TABS tablet Take 1 tablet (15 mg total) by mouth daily. 90 tablet 3  . Vitamin D, Ergocalciferol, (DRISDOL) 50000 UNITS CAPS capsule Take 50,000 Units by mouth every 7 (seven) days. Wednesday    . [DISCONTINUED] digoxin (LANOXIN) 0.125 MG tablet Take 125 mcg by mouth daily.       No current facility-administered medications for this encounter.    Allergies  Allergen Reactions  . Aspirin Other (See Comments)    Cannot have due to Xarelto and previous bleed  . Ciprofloxacin Other (See Comments)    Cant be given with her multaq    Social History   Social History  . Marital Status: Widowed    Spouse Name: N/A  . Number of Children: N/A  . Years of Education: N/A   Occupational History  . Not on file.   Social History Main Topics  . Smoking status: Never Smoker   . Smokeless tobacco: Never Used  . Alcohol Use: No  . Drug Use: No  . Sexual Activity: Not Currently   Other Topics Concern  . Not on file   Social History Narrative   She is a widowed mother of 65, grandmother of 21, great grandmother of 1. She is usually very active and likes to walk .    recently.    She quit smoking 50  years ago. She denies any alcohol.    She says in order to try to make up for the fact she has not been doing much exercise she has been walking up and down the stairs at the house and staying as active as possible, just doing whatever activity she can do.     Family History  Problem Relation Age of Onset  . Arthritis    . Heart failure Father     CHF  . Heart failure Mother     CHF  . Macular degeneration Mother     ROS- All systems are reviewed and negative except as per the HPI above  Physical Exam: Filed Vitals:   07/28/15 1008  BP: 128/70  Pulse: 122  Height: 5' (1.524 m)  Weight: 136 lb 9.6 oz (61.961 kg)    GEN- The patient is well appearing, alert and oriented x 3 today.   Head- normocephalic, atraumatic Eyes-  Sclera clear, conjunctiva pink Ears- hearing intact Oropharynx- clear Neck- supple, no JVP Lymph- no cervical lymphadenopathy Lungs- Clear to ausculation bilaterally, normal work of breathing Heart- Rapid, irregular rate and rhythm, no murmurs, rubs or gallops, PMI not laterally displaced GI- soft, NT, ND, + BS Extremities- no clubbing, cyanosis, or edema MS- no significant deformity or atrophy Skin- no rash or lesion Psych- euthymic mood, full affect Neuro- strength and sensation are intact  EKG- afib at 122 bpm, qrs int 82 bpm, qtc 387 ms.  Epic records reviewed  Assessment and Plan:  1. Afib, paroxysmal/persistent mixed,  asymptomatic Pt is not a good candidate for other antiarrhythmics as discussed in HPI Off multaq due to frequency of afib and not maintaining SR Continue cardizem 120 mg qd and aim for rate control Continue metoprolol 25 mg bid Continue xareltro BP ok with addition of cardizem 48 hour monitor to assess rate control at home bmet  Once monitor reviewed will call and discuss necessary treatment with pt.   Geroge Baseman Carroll, [facility] [address] [place], [place] [address] [phone]

## 2015-08-02 ENCOUNTER — Ambulatory Visit (INDEPENDENT_AMBULATORY_CARE_PROVIDER_SITE_OTHER): Payer: Medicare Other

## 2015-08-02 DIAGNOSIS — I480 Paroxysmal atrial fibrillation: Secondary | ICD-10-CM

## 2015-08-18 ENCOUNTER — Encounter (HOSPITAL_COMMUNITY): Payer: Self-pay | Admitting: Nurse Practitioner

## 2015-08-18 ENCOUNTER — Other Ambulatory Visit (HOSPITAL_COMMUNITY): Payer: Self-pay | Admitting: *Deleted

## 2015-08-18 ENCOUNTER — Ambulatory Visit (HOSPITAL_COMMUNITY)
Admission: RE | Admit: 2015-08-18 | Discharge: 2015-08-18 | Disposition: A | Discharge status: Home / Self Care | Payer: Medicare Other | Source: Ambulatory Visit | Attending: Nurse Practitioner | Admitting: Nurse Practitioner

## 2015-08-18 VITALS — BP 120/74 | HR 149 | Ht 60.0 in | Wt 136.2 lb

## 2015-08-18 DIAGNOSIS — I4891 Unspecified atrial fibrillation: Secondary | ICD-10-CM | POA: Diagnosis present

## 2015-08-18 MED ORDER — NITROGLYCERIN 0.4 MG SL SUBL: 0.4000 mg | SUBLINGUAL_TABLET | SUBLINGUAL | Status: AC | PRN

## 2015-08-18 MED ORDER — METOPROLOL TARTRATE 25 MG PO TABS: 25.0000 mg | ORAL_TABLET | Freq: Two times a day (BID) | ORAL | Status: DC

## 2015-08-18 MED ORDER — DILTIAZEM HCL ER COATED BEADS 120 MG PO CP24: 120.0000 mg | ORAL_CAPSULE | Freq: Two times a day (BID) | ORAL | Status: DC

## 2015-08-18 MED ORDER — LISINOPRIL 5 MG PO TABS: 2.5000 mg | ORAL_TABLET | Freq: Every day | ORAL | Status: AC

## 2015-08-18 NOTE — Progress Notes (Signed)
Patient ID: Allison Sellers, female   DOB: 12-27-1926, 82 y.o.   MRN: [id number]      Primary Care Physician: Garnet Koyanagi, DO Referring Physician:[facility] triage   Allison Sellers is a 82 y.o. female with a h/o PAF on multaq and metoprolol, with persistent afib  for 7-10 days at which time v rates were 125 bpm range.  The last time she had breakthrough afib was last June at which time she required cardioversion, 3 previous cardioversions prior to this. She does have interstitial lung disease which would  make her not  an ideal candidate for amiodarone use. She does have CKD, which would significantly limit dose of tiksoyn to the effect it may not be effective. She is not a ablation candidate due to age. Also not a flecainide/sotalol candidate due to renal function. She reports that she does not feel bad in afib and for most part since increase of metoprolol to 25 mg bid,  she has decent rate control and  BP is OK with higher dose . She is on lower dose of xarelto at 15 mg with cr cl calc at 31.0 ml/min.  She returns today with afib rate controlled. Discussed with Dr. Ellyn Hack and he would like to see if she can be returned to [place], so after  conversation with the pt and daughter, they would like to pursue cardioversion. She thinks she may have missed one dose of xarelto around January 7th, otherwise no other known missed doses. Unfortunately, due to above paragraph, she is not a good candidate for other antiarrhythmics, so will be left on multaq.  Called the office right before cardioversion and found to be in SR, so DCCV cancelled. She now returns 2/22 and is back in afib with v rate of 122. She is unaware of afib and most part feels well and the irregular heart beat does not bother her.I think since she is having so much afib, mostly asymptomatic, and not a good candidate for other antiarrythmic's, that multaq should be stopped and will attempt better rate control. She and family are in agreement.She  continues on xarelto.  Seen back in afib office 3/1. Last visit cardizem 120 mg qd was added and  multaq was stopped to aim for rate control. She continues to feel well. Does not notice afib. Able to do her house hold activities and occasionally will feel the need to rest for about 10 mins if she gets tired. No heart failure symptoms. She brings in her numbers form home and for the most part are 100 or less with some readings 65-70. Her heart rate is elevated here but she says that she is nervous on her visits.   Placed a monitor on pt to check for rate control and it showed afib with RVR with rates up to 190. I discussed with Dr. Rayann Heman and she is not a candidate for any rhythm control due to CKD, and amiodarone not an option due to pulmonary fibrosis. She is still tolerating this very well. Has rvr today, but is unaware. Not showing any signs of fluid overload. She only feels winded if she has to climb stairs. She shows me her HR/BP readings from home  and her heart rate for the most part in less that 100, with 2 readings of 124 and 132. I thinks she has sufficient BP to try to add another dose of cardizem 120 mg in the pm. To help with this, I will cut the lisinopril dose in half  for now.  Today, she denies symptoms of palpitations, chest pain, shortness of breath, orthopnea, PND, lower extremity edema, dizziness, presyncope, syncope, or neurologic sequela. The patient is tolerating medications without difficulties and is otherwise without complaint today.   Past Medical History  Diagnosis Date  . Hypertension   . Osteopenia   . PAF (paroxysmal atrial fibrillation) ([place])   . Seborrheic keratosis 06/2014    of hand.   . Chronic diastolic CHF (congestive heart failure) ([place])   . CKD (chronic kidney disease), stage III   . Diverticular disease   . Interstitial lung disease ([place])     a. Noted on CT scan 02/2011 before initiation of any antiarrhythmic meds.   Past Surgical History  Procedure  Laterality Date  . Umbilical hernia repair  ? 2013  . Cardioversion  04/18/2011    Procedure: CARDIOVERSION;  Surgeon: Leonie Man;  Location: [facility] OR;  Service: Cardiovascular;  Laterality: N/A;  . Lexiscan myoview  04/14/2011    No scintigraphic evidence of inducible myocardial ischemia, EKG negative for ischemia, patient developed Atrial Fibrillation with rapid ventricular response during stress and persisted in the recovery period, abnormal myocardial perfusion study  . 2d echocardiogram  03/29/2011    EF 55-65%, normal  . Colonoscopy N/A 11/03/2014    Procedure: COLONOSCOPY;  Surgeon: Ladene Artist, MD;  Location: [facility] ENDOSCOPY;  Service: Endoscopy;  Laterality: N/A;  . Cardioversion N/A 11/20/2014    Procedure: CARDIOVERSION;  Surgeon: Jolaine Artist, MD;  Location: [facility] OR;  Service: Cardiovascular;  Laterality: N/A;    Current Outpatient Prescriptions  Medication Sig Dispense Refill  . diltiazem (CARDIZEM CD) 120 MG 24 hr capsule Take 1 capsule (120 mg total) by mouth 2 (two) times daily. 30 capsule 3  . lisinopril (PRINIVIL,ZESTRIL) 5 MG tablet Take 0.5 tablets (2.5 mg total) by mouth daily. 90 tablet 3  . metoprolol tartrate (LOPRESSOR) 25 MG tablet Take 1 tablet (25 mg total) by mouth 2 (two) times daily. 60 tablet 3  . Rivaroxaban (XARELTO) 15 MG TABS tablet Take 1 tablet (15 mg total) by mouth daily. 90 tablet 3  . Vitamin D, Ergocalciferol, (DRISDOL) 50000 UNITS CAPS capsule Take 50,000 Units by mouth every 7 (seven) days. Wednesday    . nitroGLYCERIN (NITROSTAT) 0.4 MG SL tablet Place 1 tablet (0.4 mg total) under the tongue every 5 (five) minutes as needed. For chest pain 25 tablet 8  . [DISCONTINUED] digoxin (LANOXIN) 0.125 MG tablet Take 125 mcg by mouth daily.       No current facility-administered medications for this encounter.    Allergies  Allergen Reactions  . Aspirin Other (See Comments)    Cannot have due to Xarelto and previous bleed  . Ciprofloxacin Other  (See Comments)    Cant be given with her multaq    Social History   Social History  . Marital Status: Widowed    Spouse Name: N/A  . Number of Children: N/A  . Years of Education: N/A   Occupational History  . Not on file.   Social History Main Topics  . Smoking status: Never Smoker   . Smokeless tobacco: Never Used  . Alcohol Use: No  . Drug Use: No  . Sexual Activity: Not Currently   Other Topics Concern  . Not on file   Social History Narrative   She is a widowed mother of 19, grandmother of 60, great grandmother of 1. She is usually very active and likes to walk .  recently.    She quit smoking 50 years ago. She denies any alcohol.    She says in order to try to make up for the fact she has not been doing much exercise she has been walking up and down the stairs at the house and staying as active as possible, just doing whatever activity she can do.     Family History  Problem Relation Age of Onset  . Arthritis    . Heart failure Father     CHF  . Heart failure Mother     CHF  . Macular degeneration Mother     ROS- All systems are reviewed and negative except as per the HPI above  Physical Exam: Filed Vitals:   08/18/15 1325  BP: 120/74  Pulse: 149  Height: 5' (1.524 m)  Weight: 136 lb 3.2 oz (61.78 kg)    GEN- The patient is well appearing, alert and oriented x 3 today.   Head- normocephalic, atraumatic Eyes-  Sclera clear, conjunctiva pink Ears- hearing intact Oropharynx- clear Neck- supple, no JVP Lymph- no cervical lymphadenopathy Lungs- Clear to ausculation bilaterally, normal work of breathing Heart- Rapid, irregular rate and rhythm, no murmurs, rubs or gallops, PMI not laterally displaced GI- soft, NT, ND, + BS Extremities- no clubbing, cyanosis, or edema MS- no significant deformity or atrophy Skin- no rash or lesion Psych- euthymic mood, full affect Neuro- strength and sensation are intact  EKG- afib at 149 bpm, qrs int 80 ms, qtc 453  ms Epic records reviewed  Assessment and Plan:  1. Afib, paroxysmal/persistent mixed,  asymptomatic Pt is not a  candidate for  antiarrhythmics as discussed in HPI Off multaq due to frequency of afib and not maintaining SR Continue cardizem 120 mg but increase to bid Pt will monitor HR/BP at home Notify office if systolic BP is running less than 100 and is symptomatic Decrease lisinopril to 2.5 mg qd to help insure adequate BP with the addition of cardizem Continue metoprolol tartrate 25 mg bid, but consideration to change to succinate next for better rate control Continue xareltro  Pt asked to bring in BP cuff on next visit  Return one week for EKG, BP check She had a normal TSH in June of last year but will recheck on next visit   Butch Penny C. Gaurav Baldree, [facility] [address] [place], [place] [address] [phone]

## 2015-08-18 NOTE — Patient Instructions (Addendum)
Your physician has recommended you make the following change in your medication:  1) Increase Cardizem to 120mg  twice a day  2) Decrease lisinopril to 2.5mg  once a day

## 2015-08-23 ENCOUNTER — Telehealth: Payer: Self-pay | Admitting: Cardiology

## 2015-08-23 NOTE — Telephone Encounter (Signed)
Pt dtr calling back; Please call back and discuss.

## 2015-08-23 NOTE — Telephone Encounter (Signed)
Left msg to call.

## 2015-08-23 NOTE — Telephone Encounter (Signed)
New Message  Pt daughter request a call back to discuss the appt over in the afib clinic.

## 2015-08-23 NOTE — Telephone Encounter (Signed)
Returned call to patient's daughter Allison Sellers.She stated mother received letter to schedule appointment with [place].Stated mother goes to AFib clinic and was told she did not need appointment with [place].Stated she just wants to make sure Dr.Harding does not need to see her.Message sent to [place] for advice.

## 2015-08-24 NOTE — Telephone Encounter (Signed)
As long as she is stable with Afib - she can continue to f/u with Afib Clinic.  [place]

## 2015-08-24 NOTE — Telephone Encounter (Signed)
Spoke to daughter . Aware to continue with afib clinic- unless something else not dealing afib - ( see Dr Ellyn Hack) Patient has an appointment with AFIB  CLINIC 08/25/15.

## 2015-08-25 ENCOUNTER — Ambulatory Visit (HOSPITAL_COMMUNITY)
Admission: RE | Admit: 2015-08-25 | Discharge: 2015-08-25 | Disposition: A | Discharge status: Home / Self Care | Payer: Medicare Other | Source: Ambulatory Visit | Attending: Nurse Practitioner | Admitting: Nurse Practitioner

## 2015-08-25 ENCOUNTER — Encounter (HOSPITAL_COMMUNITY): Payer: Self-pay | Admitting: Nurse Practitioner

## 2015-08-25 DIAGNOSIS — Z79899 Other long term (current) drug therapy: Secondary | ICD-10-CM | POA: Insufficient documentation

## 2015-08-25 DIAGNOSIS — I480 Paroxysmal atrial fibrillation: Secondary | ICD-10-CM | POA: Diagnosis not present

## 2015-08-25 DIAGNOSIS — I4891 Unspecified atrial fibrillation: Secondary | ICD-10-CM | POA: Diagnosis present

## 2015-08-25 LAB — TSH: TSH: 2.407 u[IU]/mL (ref 0.350–4.500)

## 2015-08-25 MED ORDER — METOPROLOL SUCCINATE ER 25 MG PO TB24: 25.0000 mg | ORAL_TABLET | Freq: Two times a day (BID) | ORAL | Status: DC

## 2015-08-25 NOTE — Patient Instructions (Signed)
Your physician has recommended you make the following change in your medication:  1)Stop Metoprolol Tartrate (lopressor) 2)Start Metoprolol Succinate (toprol) 25mg  twice a day

## 2015-08-25 NOTE — Progress Notes (Addendum)
Pt in for EKG/BP check for recent change of cardizem to BID. Roderic Palau NP to review EKG   Pt in for f/u with recent increase in cardizem to rate control afib. She is in afib at 98 bpm,(140 bpm on last visit) tolerating increase in cardizem. BP ok with increased dose. Today will switch metoprolol tartrate to succinate but keep same dose to see if improves rate control. F/u in 7-10 days.

## 2015-09-08 ENCOUNTER — Ambulatory Visit (HOSPITAL_COMMUNITY)
Admission: RE | Admit: 2015-09-08 | Discharge: 2015-09-08 | Disposition: A | Discharge status: Home / Self Care | Payer: Medicare Other | Source: Ambulatory Visit | Attending: Nurse Practitioner | Admitting: Nurse Practitioner

## 2015-09-08 DIAGNOSIS — I480 Paroxysmal atrial fibrillation: Secondary | ICD-10-CM

## 2015-09-08 DIAGNOSIS — I4891 Unspecified atrial fibrillation: Secondary | ICD-10-CM | POA: Insufficient documentation

## 2015-09-08 DIAGNOSIS — J849 Interstitial pulmonary disease, unspecified: Secondary | ICD-10-CM | POA: Insufficient documentation

## 2015-09-08 DIAGNOSIS — N189 Chronic kidney disease, unspecified: Secondary | ICD-10-CM | POA: Insufficient documentation

## 2015-09-08 NOTE — Progress Notes (Addendum)
Pt here for repeat EKG/BP check following change from metoprolol tartrate to succinate. Roderic Palau NP to review EKG  Pt continues to have afib with rvr, have struggled with rate control although pt feels well unless she has to walk a long distance, then she has to sit down and rest. Her heart rates at home look better controlled , less than 100 but always faster here. She has interstitial  lung disease and CKD which limits use of AAD's. Most recently taken off multaq. I will refer her to have consult with Dr. Rayann Heman for other options to control RVR.

## 2015-09-29 ENCOUNTER — Encounter: Payer: Self-pay | Admitting: Internal Medicine

## 2015-09-29 ENCOUNTER — Ambulatory Visit (INDEPENDENT_AMBULATORY_CARE_PROVIDER_SITE_OTHER): Payer: Medicare Other | Admitting: Internal Medicine

## 2015-09-29 VITALS — BP 124/80 | HR 123 | Ht 60.0 in | Wt 135.6 lb

## 2015-09-29 DIAGNOSIS — I480 Paroxysmal atrial fibrillation: Secondary | ICD-10-CM

## 2015-09-29 MED ORDER — DILTIAZEM HCL ER COATED BEADS 120 MG PO CP24
ORAL_CAPSULE | ORAL | Status: DC
Start: 2015-09-29 — End: 2016-04-07

## 2015-09-29 NOTE — Patient Instructions (Signed)
Medication Instructions:  Your physician has recommended you make the following change in your medication:  1) Increase Cardizem 240 mg in the am and 120mg  in the pm   Labwork: None ordered   Testing/Procedures: Your physician has requested that you have an echocardiogram. Echocardiography is a painless test that uses sound waves to create images of your heart. It provides your doctor with information about the size and shape of your heart and how well your heart's chambers and valves are working. This procedure takes approximately one hour. There are no restrictions for this procedure.    Follow-Up: Your physician recommends that you schedule a follow-up appointment in: 1 week with Allison Palau, NP and 4 weeks with Allison Sellers   Any Other Special Instructions Will Be Listed Below (If Applicable).     If you need a refill on your cardiac medications before your next appointment, please call your pharmacy.

## 2015-09-29 NOTE — Progress Notes (Signed)
Electrophysiology Office Note   Date:  09/29/2015   ID:  Allison Sellers, DOB [birth year], MRN [id number]  PCP:  Ann Held, DO  Cardiologist:  Dr Ellyn Hack Primary Electrophysiologist: Thompson Grayer, MD    Chief Complaint  Patient presents with  . Atrial Fibrillation     History of Present Illness: Allison Sellers is a 82 y.o. female who presents today for electrophysiology evaluation.   She reports having afib for about 5 years.  She initially did very well with multaq.  About a year ago, she began having more afib and the medicine was discontinued.  Over this past year, her afib has become persistent.  She has renal failure and prior fibrotic lung changes and thus her antiarrhythmic options have been limited.  Rate control has also proven difficult.  She has symptoms of SOB and fatigue.  She remains active for her age however.  She continues to drive and enjoys eating out with her friends and visiting great grandchildren.  Today, she denies symptoms of palpitations, chest pain,  orthopnea, PND, lower extremity edema, claudication, dizziness, presyncope, syncope, bleeding, or neurologic sequela. The patient is tolerating medications without difficulties and is otherwise without complaint today.    Past Medical History  Diagnosis Date  . Hypertension   . Osteopenia   . Persistent atrial fibrillation ([place])   . Seborrheic keratosis 06/2014    of hand.   . Chronic diastolic CHF (congestive heart failure) ([place])   . CKD (chronic kidney disease), stage III   . Diverticular disease   . Interstitial lung disease ([place])     a. Noted on CT scan 02/2011 before initiation of any antiarrhythmic meds.   Past Surgical History  Procedure Laterality Date  . Umbilical hernia repair  ? 2013  . Cardioversion  04/18/2011    Procedure: CARDIOVERSION;  Surgeon: Leonie Man;  Location: [facility] OR;  Service: Cardiovascular;  Laterality: N/A;  . Lexiscan myoview  04/14/2011    No scintigraphic  evidence of inducible myocardial ischemia, EKG negative for ischemia, patient developed Atrial Fibrillation with rapid ventricular response during stress and persisted in the recovery period, abnormal myocardial perfusion study  . 2d echocardiogram  03/29/2011    EF 55-65%, normal  . Colonoscopy N/A 11/03/2014    Procedure: COLONOSCOPY;  Surgeon: Ladene Artist, MD;  Location: [facility] ENDOSCOPY;  Service: Endoscopy;  Laterality: N/A;  . Cardioversion N/A 11/20/2014    Procedure: CARDIOVERSION;  Surgeon: Jolaine Artist, MD;  Location: [facility] OR;  Service: Cardiovascular;  Laterality: N/A;     Current Outpatient Prescriptions  Medication Sig Dispense Refill  . diltiazem (CARDIZEM CD) 120 MG 24 hr capsule Take 2 capsules by mouth in the am and 1 capsule by mouth in the pm 90 capsule 6  . lisinopril (PRINIVIL,ZESTRIL) 5 MG tablet Take 0.5 tablets (2.5 mg total) by mouth daily. 90 tablet 3  . metoprolol succinate (TOPROL XL) 25 MG 24 hr tablet Take 1 tablet (25 mg total) by mouth 2 (two) times daily. 60 tablet 3  . nitroGLYCERIN (NITROSTAT) 0.4 MG SL tablet Place 1 tablet (0.4 mg total) under the tongue every 5 (five) minutes as needed. For chest pain 25 tablet 8  . Rivaroxaban (XARELTO) 15 MG TABS tablet Take 1 tablet (15 mg total) by mouth daily. 90 tablet 3  . Vitamin D, Ergocalciferol, (DRISDOL) 50000 UNITS CAPS capsule Take 50,000 Units by mouth every 7 (seven) days. Wednesday    . [DISCONTINUED] digoxin (LANOXIN) 0.125  MG tablet Take 125 mcg by mouth daily.       No current facility-administered medications for this visit.    Allergies:   Aspirin and Ciprofloxacin   Social History:  The patient  reports that she has never smoked. She has never used smokeless tobacco. She reports that she does not drink alcohol or use illicit drugs.   Family History:  The patient's  family history includes Heart failure in her father and mother; Macular degeneration in her mother.    ROS:  Please see the  history of present illness.   All other systems are reviewed and negative.    PHYSICAL EXAM: VS:  BP 124/80 mmHg  Pulse 123  Ht 5' (1.524 m)  Wt 135 lb 9.6 oz (61.508 kg)  BMI 26.48 kg/m2 , BMI Body mass index is 26.48 kg/(m^2). GEN: Well nourished, well developed, in no acute distress HEENT: normal Neck: no JVD, carotid bruits, or masses Cardiac: tachycardic irregular rhythm, no murmurs, rubs, or gallops,no edema  Respiratory:  Few dry rales, normal work of breathing GI: soft, nontender, nondistended, + BS MS: no deformity or atrophy Skin: warm and dry  Neuro:  Strength and sensation are intact Psych: euthymic mood, full affect  EKG:  EKG is ordered today. The ekg ordered today shows afib with RVR (V rates 123 bpm)   Recent Labs: 11/12/2014: Magnesium 2.1 05/11/2015: ALT 9 06/30/2015: Hemoglobin 14.7; Platelets 382 07/28/2015: BUN 17; Creatinine, Ser 1.32*; Potassium 5.1; Sodium 139 08/25/2015: TSH 2.407    Lipid Panel     Component Value Date/Time   CHOL 128 05/11/2015 1138   TRIG 97.0 05/11/2015 1138   HDL 56.10 05/11/2015 1138   CHOLHDL 2 05/11/2015 1138   VLDL 19.4 05/11/2015 1138   LDLCALC 52 05/11/2015 1138     Wt Readings from Last 3 Encounters:  09/29/15 135 lb 9.6 oz (61.508 kg)  08/18/15 136 lb 3.2 oz (61.78 kg)  07/28/15 136 lb 9.6 oz (61.961 kg)      Other studies Reviewed: Additional studies/ records that were reviewed today include: AF clinic notes    ASSESSMENT AND PLAN:  1.  Persistent atrial fibrillation The patient has symptomatic persistent afib.  She has failed medical therapy with multaq.  Given her comorbidities, AAD options are limited.  I do think that we could try flecainide.  I also discussed amiodarone as a palliative option.  Prior to considering these options, I would prefer to continue to uptitrate her rate controlling drugs. Increase diltiazem CD to 240mg  Qam and 120 mg Qpm today.  She can take an additional metoprolol 25mg  on days  that her heart rates are increased. She will return to AF clinic in 1 week.  If not doing better, I would add flecianide 50mg  BID.  Repeat ekg and check peak flecainide level 1 week after initiation. We also discussed pacemaker with AV nodal ablation today.  I would like to reserve this as a last resort. Echo at this time. If EF <50%, genetic AF may also be an option. Continue xarelto  Follow-up with Butch Penny in 1 week for followup on rate control and consideration of low dose flecainide if still having issues. Return to see me  In 4 weeks  Current medicines are reviewed at length with the patient today.   The patient does not have concerns regarding her medicines.  The following changes were made today:  none  Labs/ tests ordered today include:  Orders Placed This Encounter  Procedures  .  EKG 12-Lead  . ECHOCARDIOGRAM COMPLETE     Signed, Thompson Grayer, MD  09/29/2015 9:42 AM     [facility] [address] [place] [address] [phone] (office) [phone] (fax)

## 2015-10-06 ENCOUNTER — Ambulatory Visit (HOSPITAL_COMMUNITY)
Admission: RE | Admit: 2015-10-06 | Discharge: 2015-10-06 | Disposition: A | Discharge status: Home / Self Care | Payer: Medicare Other | Source: Ambulatory Visit | Attending: Nurse Practitioner | Admitting: Nurse Practitioner

## 2015-10-06 ENCOUNTER — Encounter (HOSPITAL_COMMUNITY): Payer: Self-pay | Admitting: Nurse Practitioner

## 2015-10-06 VITALS — BP 122/70 | HR 109 | Ht 60.0 in | Wt 135.2 lb

## 2015-10-06 DIAGNOSIS — I4891 Unspecified atrial fibrillation: Secondary | ICD-10-CM | POA: Diagnosis not present

## 2015-10-06 DIAGNOSIS — I5032 Chronic diastolic (congestive) heart failure: Secondary | ICD-10-CM | POA: Diagnosis not present

## 2015-10-06 DIAGNOSIS — I481 Persistent atrial fibrillation: Secondary | ICD-10-CM | POA: Insufficient documentation

## 2015-10-06 DIAGNOSIS — J849 Interstitial pulmonary disease, unspecified: Secondary | ICD-10-CM | POA: Insufficient documentation

## 2015-10-06 DIAGNOSIS — Z7901 Long term (current) use of anticoagulants: Secondary | ICD-10-CM | POA: Diagnosis not present

## 2015-10-06 DIAGNOSIS — Z87891 Personal history of nicotine dependence: Secondary | ICD-10-CM | POA: Diagnosis not present

## 2015-10-06 DIAGNOSIS — I480 Paroxysmal atrial fibrillation: Secondary | ICD-10-CM | POA: Diagnosis present

## 2015-10-06 DIAGNOSIS — N183 Chronic kidney disease, stage 3 (moderate): Secondary | ICD-10-CM | POA: Diagnosis not present

## 2015-10-06 DIAGNOSIS — I130 Hypertensive heart and chronic kidney disease with heart failure and stage 1 through stage 4 chronic kidney disease, or unspecified chronic kidney disease: Secondary | ICD-10-CM | POA: Diagnosis not present

## 2015-10-06 DIAGNOSIS — Z0189 Encounter for other specified special examinations: Secondary | ICD-10-CM | POA: Diagnosis present

## 2015-10-06 DIAGNOSIS — M8580 Other specified disorders of bone density and structure, unspecified site: Secondary | ICD-10-CM | POA: Diagnosis not present

## 2015-10-06 NOTE — Progress Notes (Signed)
Patient ID: Allison Sellers, female   DOB: 31-Dec-1926, 82 y.o.   MRN: [id number]      Primary Care Physician: Allison Held, DO Referring Physician:[facility]   Allison Sellers is a 82 y.o. female with a h/o PAF on multaq and metoprolol, with persistent afib  for 7-10 days at which time v rates were 125 bpm range.  The last time she had breakthrough afib was last June at which time she required cardioversion, 3 previous cardioversions prior to this. She does have interstitial lung disease which would  make her not  an ideal candidate for amiodarone use. She does have CKD, which would significantly limit dose of tiksoyn to the effect it may not be effective. She is not a ablation candidate due to age. Also not a flecainide/sotalol candidate due to renal function. She reports that she does not feel bad in afib and for most part since increase of metoprolol to 25 mg bid,  she has decent rate control and  BP is OK with higher dose . She is on lower dose of xarelto at 15 mg with cr cl calc at 31.0 ml/min.  She returns today with afib rate controlled. Discussed with Dr. Ellyn Hack and he would like to see if she can be returned to [place], so after  conversation with the pt and daughter, they would like to pursue cardioversion. She thinks she may have missed one dose of xarelto around January 7th, otherwise no other known missed doses. Unfortunately, due to above paragraph, she is not a good candidate for other antiarrhythmics, so will be left on multaq.  Called the office right before cardioversion and found to be in SR, so DCCV cancelled. She now returns 2/22 and is back in afib with v rate of 122. She is unaware of afib and most part feels well and the irregular heart beat does not bother her.I think since she is having so much afib, mostly asymptomatic, and not a good candidate for other antiarrythmic's, that multaq should be stopped and will attempt better rate control. She and family are in  agreement.She continues on xarelto.  Seen back in afib office 3/1. Last visit cardizem 120 mg qd was added and  multaq was stopped to aim for rate control. She continues to feel well. Does not notice afib. Able to do her house hold activities and occasionally will feel the need to rest for about 10 mins if she gets tired. No heart failure symptoms. She brings in her numbers form home and for the most part are 100 or less with some readings 65-70. Her heart rate is elevated here but she says that she is nervous on her visits.   Placed a monitor on pt to check for rate control and it showed afib with RVR with rates up to 190. I discussed with Dr. Rayann Heman and she is not a candidate for any rhythm control due to CKD, and amiodarone not an option due to pulmonary fibrosis. She is still tolerating this very well. Has rvr today, but is unaware. Not showing any signs of fluid overload. She only feels winded if she has to climb stairs. She shows me her HR/BP readings from home  and her heart rate for the most part in less that 100, with 2 readings of 124 and 132. I thinks she has sufficient BP to try to add another dose of cardizem 120 mg in the pm. To help with this, I will cut the lisinopril dose  in half for now.  After several attempts to rate control pt, she continued with afib with rvr. She was give an appointment with Dr. Rayann Heman for options. He wanted to try further rate control, ordered an echo, mentioned consideration use of flecainide but would require EKG and flecainide level within one week. Pt is feeling more short of breath with activities. Other wise BP is holding  with extra rate control meds. HR at home for most part is recorded less than 100 but with walking in room today, HR 140's but then settled down to around 100 with EKG. She is pending echo within one week and would like to make sure EF is normal before starting flecainide, with pt c/o of more shortness of breath. Last option per Dr. Rayann Heman is AV  nodal ablation and PPM.  Today, she denies symptoms of palpitations, chest pain,orthopnea, PND, lower extremity edema, dizziness, presyncope, syncope, or neurologic sequela. Shortness of breath with activities. Weight is stable.The patient is tolerating medications without difficulties and is otherwise without complaint today.   Past Medical History  Diagnosis Date  . Hypertension   . Osteopenia   . Persistent atrial fibrillation ([place])   . Seborrheic keratosis 06/2014    of hand.   . Chronic diastolic CHF (congestive heart failure) ([place])   . CKD (chronic kidney disease), stage III   . Diverticular disease   . Interstitial lung disease ([place])     a. Noted on CT scan 02/2011 before initiation of any antiarrhythmic meds.   Past Surgical History  Procedure Laterality Date  . Umbilical hernia repair  ? 2013  . Cardioversion  04/18/2011    Procedure: CARDIOVERSION;  Surgeon: Leonie Man;  Location: [facility] OR;  Service: Cardiovascular;  Laterality: N/A;  . Lexiscan myoview  04/14/2011    No scintigraphic evidence of inducible myocardial ischemia, EKG negative for ischemia, patient developed Atrial Fibrillation with rapid ventricular response during stress and persisted in the recovery period, abnormal myocardial perfusion study  . 2d echocardiogram  03/29/2011    EF 55-65%, normal  . Colonoscopy N/A 11/03/2014    Procedure: COLONOSCOPY;  Surgeon: Ladene Artist, MD;  Location: [facility] ENDOSCOPY;  Service: Endoscopy;  Laterality: N/A;  . Cardioversion N/A 11/20/2014    Procedure: CARDIOVERSION;  Surgeon: Jolaine Artist, MD;  Location: [facility] OR;  Service: Cardiovascular;  Laterality: N/A;    Current Outpatient Prescriptions  Medication Sig Dispense Refill  . diltiazem (CARDIZEM CD) 120 MG 24 hr capsule Take 2 capsules by mouth in the am and 1 capsule by mouth in the pm 90 capsule 6  . lisinopril (PRINIVIL,ZESTRIL) 5 MG tablet Take 0.5 tablets (2.5 mg total) by mouth daily. 90 tablet 3  .  metoprolol succinate (TOPROL XL) 25 MG 24 hr tablet Take 1 tablet (25 mg total) by mouth 2 (two) times daily. 60 tablet 3  . nitroGLYCERIN (NITROSTAT) 0.4 MG SL tablet Place 1 tablet (0.4 mg total) under the tongue every 5 (five) minutes as needed. For chest pain 25 tablet 8  . Rivaroxaban (XARELTO) 15 MG TABS tablet Take 1 tablet (15 mg total) by mouth daily. 90 tablet 3  . Vitamin D, Ergocalciferol, (DRISDOL) 50000 UNITS CAPS capsule Take 50,000 Units by mouth every 7 (seven) days. Wednesday    . [DISCONTINUED] digoxin (LANOXIN) 0.125 MG tablet Take 125 mcg by mouth daily.       No current facility-administered medications for this encounter.    Allergies  Allergen Reactions  . Aspirin Other (  See Comments)    Cannot have due to Xarelto and previous bleed  . Ciprofloxacin Other (See Comments)    Cant be given with her multaq    Social History   Social History  . Marital Status: Widowed    Spouse Name: N/A  . Number of Children: N/A  . Years of Education: N/A   Occupational History  . Not on file.   Social History Main Topics  . Smoking status: Never Smoker   . Smokeless tobacco: Never Used  . Alcohol Use: No  . Drug Use: No  . Sexual Activity: Not Currently   Other Topics Concern  . Not on file   Social History Narrative   She is a widowed mother of 51, grandmother of 62, great grandmother of 1. She is usually very active and likes to walk .    recently.    She quit smoking 50 years ago. She denies any alcohol.    She says in order to try to make up for the fact she has not been doing much exercise she has been walking up and down the stairs at the house and staying as active as possible, just doing whatever activity she can do.     Family History  Problem Relation Age of Onset  . Arthritis    . Heart failure Father     CHF  . Heart failure Mother     CHF  . Macular degeneration Mother     ROS- All systems are reviewed and negative except as per the HPI  above  Physical Exam: Filed Vitals:   10/06/15 1000  BP: 122/70  Pulse: 109  Height: 5' (1.524 m)  Weight: 135 lb 3.2 oz (61.326 kg)    GEN- The patient is well appearing, alert and oriented x 3 today.   Head- normocephalic, atraumatic Eyes-  Sclera clear, conjunctiva pink Ears- hearing intact Oropharynx- clear Neck- supple, no JVP Lymph- no cervical lymphadenopathy Lungs- Clear to ausculation bilaterally, normal work of breathing Heart- Rapid, irregular rate and rhythm, no murmurs, rubs or gallops, PMI not laterally displaced GI- soft, NT, ND, + BS Extremities- no clubbing, cyanosis, or edema MS- no significant deformity or atrophy Skin- no rash or lesion Psych- euthymic mood, full affect Neuro- strength and sensation are intact  EKG- afib at 149 bpm, qrs int 80 ms, qtc 453 ms Epic records reviewed  Assessment and Plan:  1. Afib, persistent, symptomatic Continue rate control as outlined per Dr. Rayann Heman Pending echo, 5/16, but would want to see EF before consideration of starting flecainide F/u with Dr. Rayann Heman as scheduled 5/31 for review of echo and further treatment   Butch Penny C. Delbert Vu, [facility] [address] [place], [place] [address] [phone]

## 2015-10-12 ENCOUNTER — Other Ambulatory Visit: Payer: Self-pay

## 2015-10-12 ENCOUNTER — Ambulatory Visit (HOSPITAL_COMMUNITY): Payer: Medicare Other | Attending: Cardiology

## 2015-10-12 DIAGNOSIS — Z8249 Family history of ischemic heart disease and other diseases of the circulatory system: Secondary | ICD-10-CM | POA: Insufficient documentation

## 2015-10-12 DIAGNOSIS — I059 Rheumatic mitral valve disease, unspecified: Secondary | ICD-10-CM | POA: Diagnosis not present

## 2015-10-12 DIAGNOSIS — I340 Nonrheumatic mitral (valve) insufficiency: Secondary | ICD-10-CM | POA: Insufficient documentation

## 2015-10-12 DIAGNOSIS — I130 Hypertensive heart and chronic kidney disease with heart failure and stage 1 through stage 4 chronic kidney disease, or unspecified chronic kidney disease: Secondary | ICD-10-CM | POA: Insufficient documentation

## 2015-10-12 DIAGNOSIS — I071 Rheumatic tricuspid insufficiency: Secondary | ICD-10-CM | POA: Insufficient documentation

## 2015-10-12 DIAGNOSIS — I4891 Unspecified atrial fibrillation: Secondary | ICD-10-CM | POA: Diagnosis present

## 2015-10-12 DIAGNOSIS — I509 Heart failure, unspecified: Secondary | ICD-10-CM | POA: Insufficient documentation

## 2015-10-12 DIAGNOSIS — N189 Chronic kidney disease, unspecified: Secondary | ICD-10-CM | POA: Diagnosis not present

## 2015-10-12 DIAGNOSIS — I480 Paroxysmal atrial fibrillation: Secondary | ICD-10-CM | POA: Diagnosis not present

## 2015-10-21 ENCOUNTER — Telehealth: Payer: Self-pay | Admitting: Family Medicine

## 2015-10-21 ENCOUNTER — Other Ambulatory Visit (INDEPENDENT_AMBULATORY_CARE_PROVIDER_SITE_OTHER): Payer: Medicare Other

## 2015-10-21 DIAGNOSIS — R300 Dysuria: Secondary | ICD-10-CM

## 2015-10-21 LAB — POC URINALSYSI DIPSTICK (AUTOMATED)
Bilirubin, UA: NEGATIVE
GLUCOSE UA: NEGATIVE
KETONES UA: NEGATIVE
Nitrite, UA: NEGATIVE
PROTEIN UA: NEGATIVE
Spec Grav, UA: 1.015
Urobilinogen, UA: 2
pH, UA: 6

## 2015-10-21 NOTE — Telephone Encounter (Signed)
Manuela Schwartz - Daughter -[phone]   Called in because she says that her mom has a UTI. She says that it started on Saturday. She would like to know if they could just bring in a sample instead of scheduling an appt?   Please advise

## 2015-10-21 NOTE — Telephone Encounter (Signed)
Yes-- they need sterile cup

## 2015-10-21 NOTE — Telephone Encounter (Signed)
Allison Sellers has been made aware and verbalized understanding, they do not have any cups at home so she will bring the patient by the office in the morning.     KP

## 2015-10-21 NOTE — Telephone Encounter (Signed)
Last seen 05/11/15, Please advise if it ok to schedule a lab apt.      KP

## 2015-10-22 MED ORDER — CEPHALEXIN 500 MG PO CAPS: 500.0000 mg | ORAL_CAPSULE | Freq: Two times a day (BID) | ORAL | Status: DC

## 2015-10-22 NOTE — Telephone Encounter (Signed)
Allison Sellers has been made aware and verbalized understanding. Rx faxed.    KP

## 2015-10-22 NOTE — Telephone Encounter (Signed)
To MD to advise.      KP 

## 2015-10-22 NOTE — Telephone Encounter (Signed)
See ua

## 2015-10-22 NOTE — Telephone Encounter (Signed)
Caller name:[name](daughter) Call back number: [phone]  Pharmacy: [facility] [address] [place] [phone] (Phone) [phone] (Fax)         Reason for call:  Daughter inquiring about UA results, daughter states she's experiencing frequent urinnation and burning and doesn't want her mother to go thru the weekend feeling like this. Please advise

## 2015-10-22 NOTE — Addendum Note (Signed)
Addended by: Ewing Schlein on: 10/22/2015 05:26 PM   Modules accepted: Orders

## 2015-10-22 NOTE — Telephone Encounter (Signed)
Notes Recorded by Ann Held, DO on 10/22/2015 at 5:10 PM Just bacteria- all else neg Keflex 500 mg bid x 7 days - but we will not know for sure it is the right abx until tuesday

## 2015-10-27 ENCOUNTER — Encounter: Payer: Self-pay | Admitting: Internal Medicine

## 2015-10-27 ENCOUNTER — Ambulatory Visit (INDEPENDENT_AMBULATORY_CARE_PROVIDER_SITE_OTHER): Payer: Medicare Other | Admitting: Internal Medicine

## 2015-10-27 VITALS — BP 140/84 | HR 123 | Ht 60.0 in | Wt 132.2 lb

## 2015-10-27 DIAGNOSIS — I4819 Other persistent atrial fibrillation: Secondary | ICD-10-CM

## 2015-10-27 DIAGNOSIS — I481 Persistent atrial fibrillation: Secondary | ICD-10-CM

## 2015-10-27 MED ORDER — METOPROLOL SUCCINATE ER 25 MG PO TB24: ORAL_TABLET | ORAL | Status: DC

## 2015-10-27 NOTE — Patient Instructions (Addendum)
Medication Instructions:  Your physician has recommended you make the following change in your medication:  1) CHANGE the way you take Metoprolol Succinate -- take 50 mg (2 tablets)  in the morning and 25 mg (1 tablet) in the evening.  Labwork: None ordered  Testing/Procedures: None ordered  Follow-Up: Your physician recommends that you schedule a follow-up appointment in: 2 months with Roderic Palau, NP in the AFib clinic.  If you need a refill on your cardiac medications before your next appointment, please call your pharmacy.  Thank you for choosing [facility]!!

## 2015-10-27 NOTE — Progress Notes (Signed)
Electrophysiology Office Note   Date:  10/27/2015   ID:  Allison Sellers, DOB 1927-05-16, MRN [id number]  PCP:  Ann Held, DO  Cardiologist:  Dr Ellyn Hack Primary Electrophysiologist: Thompson Grayer, MD    Chief Complaint  Patient presents with  . Atrial Fibrillation     History of Present Illness: Allison Sellers is a 82 y.o. female who presents today for electrophysiology evaluation.   She reports having afib for about 5 years.  She reports good rate control at home though V rates are elevated today.  Minimal symptoms with AF.  No CHF symptoms.  Severe LA enlargement, EF 45% by recent echo. Today, she denies symptoms of palpitations, chest pain,  orthopnea, PND, lower extremity edema, claudication, dizziness, presyncope, syncope, bleeding, or neurologic sequela. The patient is tolerating medications without difficulties and is otherwise without complaint today.    Past Medical History  Diagnosis Date  . Hypertension   . Osteopenia   . Persistent atrial fibrillation ([place])   . Seborrheic keratosis 06/2014    of hand.   . Chronic diastolic CHF (congestive heart failure) ([place])   . CKD (chronic kidney disease), stage III   . Diverticular disease   . Interstitial lung disease ([place])     a. Noted on CT scan 02/2011 before initiation of any antiarrhythmic meds.   Past Surgical History  Procedure Laterality Date  . Umbilical hernia repair  ? 2013  . Cardioversion  04/18/2011    Procedure: CARDIOVERSION;  Surgeon: Leonie Man;  Location: [facility] OR;  Service: Cardiovascular;  Laterality: N/A;  . Lexiscan myoview  04/14/2011    No scintigraphic evidence of inducible myocardial ischemia, EKG negative for ischemia, patient developed Atrial Fibrillation with rapid ventricular response during stress and persisted in the recovery period, abnormal myocardial perfusion study  . 2d echocardiogram  03/29/2011    EF 55-65%, normal  . Colonoscopy N/A 11/03/2014    Procedure: COLONOSCOPY;   Surgeon: Ladene Artist, MD;  Location: [facility] ENDOSCOPY;  Service: Endoscopy;  Laterality: N/A;  . Cardioversion N/A 11/20/2014    Procedure: CARDIOVERSION;  Surgeon: Jolaine Artist, MD;  [facility]: [facility] OR;  Service: Cardiovascular;  Laterality: N/A;     Current Outpatient Prescriptions  Medication Sig Dispense Refill  . cephALEXin (KEFLEX) 500 MG capsule Take 1 capsule (500 mg total) by mouth 2 (two) times daily. 14 capsule 0  . diltiazem (CARDIZEM CD) 120 MG 24 hr capsule Take 2 capsules by mouth in the am and 1 capsule by mouth in the pm 90 capsule 6  . lisinopril (PRINIVIL,ZESTRIL) 5 MG tablet Take 0.5 tablets (2.5 mg total) by mouth daily. 90 tablet 3  . metoprolol succinate (TOPROL XL) 25 MG 24 hr tablet Take 2 tablets (50 mg total) in the a.m., then take 1 tablet (25 mg total) in the p.m. 90 tablet 6  . nitroGLYCERIN (NITROSTAT) 0.4 MG SL tablet Place 1 tablet (0.4 mg total) under the tongue every 5 (five) minutes as needed. For chest pain 25 tablet 8  . Rivaroxaban (XARELTO) 15 MG TABS tablet Take 1 tablet (15 mg total) by mouth daily. 90 tablet 3  . Vitamin D, Ergocalciferol, (DRISDOL) 50000 UNITS CAPS capsule Take 50,000 Units by mouth every 7 (seven) days. Wednesday    . [DISCONTINUED] digoxin (LANOXIN) 0.125 MG tablet Take 125 mcg by mouth daily.       No current facility-administered medications for this visit.    Allergies:   Aspirin and Ciprofloxacin  Social History:  The patient  reports that she has never smoked. She has never used smokeless tobacco. She reports that she does not drink alcohol or use illicit drugs.   Family History:  The patient's  family history includes Heart failure in her father and mother; Macular degeneration in her mother.    ROS:  Please see the history of present illness.   All other systems are reviewed and negative.    PHYSICAL EXAM: VS:  BP 140/84 mmHg  Pulse 123  Ht 5' (1.524 m)  Wt 132 lb 3.2 oz (59.966 kg)  BMI 25.82 kg/m2 , BMI Body  mass index is 25.82 kg/(m^2). GEN: Well nourished, well developed, in no acute distress HEENT: normal Neck: no JVD, carotid bruits, or masses Cardiac: tachycardic irregular rhythm, no murmurs, rubs, or gallops,no edema  Respiratory:  Few dry rales, normal work of breathing GI: soft, nontender, nondistended, + BS MS: no deformity or atrophy Skin: warm and dry  Neuro:  Strength and sensation are intact Psych: euthymic mood, full affect  EKG:  EKG is ordered today. The ekg ordered today shows afib with RVR (V rates 123 bpm)   Recent Labs: 11/12/2014: Magnesium 2.1 05/11/2015: ALT 9 06/30/2015: Hemoglobin 14.7; Platelets 382 07/28/2015: BUN 17; Creatinine, Ser 1.32*; Potassium 5.1; Sodium 139 08/25/2015: TSH 2.407    Lipid Panel     Component Value Date/Time   CHOL 128 05/11/2015 1138   TRIG 97.0 05/11/2015 1138   HDL 56.10 05/11/2015 1138   CHOLHDL 2 05/11/2015 1138   VLDL 19.4 05/11/2015 1138   LDLCALC 52 05/11/2015 1138     Wt Readings from Last 3 Encounters:  10/27/15 132 lb 3.2 oz (59.966 kg)  10/06/15 135 lb 3.2 oz (61.326 kg)  09/29/15 135 lb 9.6 oz (61.508 kg)      Other studies Reviewed: Additional studies/ records that were reviewed today include: AF clinic notes    ASSESSMENT AND PLAN:  1.  Persistent atrial fibrillation Given severe LA enlargement, I would favor rate control long term She would like to avoid ep procedures including PPM and AV nodal ablation. Given advanced age, a conservative approach is advised Increase toprol to 50mg  qam and 25mg  qpm today.  This can be further uptitrated if needed  Follow-up with Butch Penny in 2 months  Current medicines are reviewed at length with the patient today.   The patient does not have concerns regarding her medicines.  The following changes were made today:  none  Labs/ tests ordered today include:  Orders Placed This Encounter  Procedures  . EKG 12-Lead     Signed, Thompson Grayer, MD  10/27/2015 2:04 PM      [place] [address] [phone] (office) [phone] (fax)

## 2015-12-29 ENCOUNTER — Encounter (HOSPITAL_COMMUNITY): Payer: Self-pay | Admitting: Nurse Practitioner

## 2015-12-29 ENCOUNTER — Ambulatory Visit (HOSPITAL_COMMUNITY)
Admission: RE | Admit: 2015-12-29 | Discharge: 2015-12-29 | Disposition: A | Discharge status: Home / Self Care | Payer: Medicare Other | Source: Ambulatory Visit | Attending: Nurse Practitioner | Admitting: Nurse Practitioner

## 2015-12-29 VITALS — BP 126/82 | HR 108 | Ht 60.0 in | Wt 135.4 lb

## 2015-12-29 DIAGNOSIS — I130 Hypertensive heart and chronic kidney disease with heart failure and stage 1 through stage 4 chronic kidney disease, or unspecified chronic kidney disease: Secondary | ICD-10-CM | POA: Insufficient documentation

## 2015-12-29 DIAGNOSIS — J849 Interstitial pulmonary disease, unspecified: Secondary | ICD-10-CM | POA: Diagnosis not present

## 2015-12-29 DIAGNOSIS — Z8489 Family history of other specified conditions: Secondary | ICD-10-CM | POA: Diagnosis not present

## 2015-12-29 DIAGNOSIS — Z8249 Family history of ischemic heart disease and other diseases of the circulatory system: Secondary | ICD-10-CM | POA: Diagnosis not present

## 2015-12-29 DIAGNOSIS — I481 Persistent atrial fibrillation: Secondary | ICD-10-CM

## 2015-12-29 DIAGNOSIS — N183 Chronic kidney disease, stage 3 (moderate): Secondary | ICD-10-CM | POA: Diagnosis not present

## 2015-12-29 DIAGNOSIS — Z79899 Other long term (current) drug therapy: Secondary | ICD-10-CM | POA: Insufficient documentation

## 2015-12-29 DIAGNOSIS — M8580 Other specified disorders of bone density and structure, unspecified site: Secondary | ICD-10-CM | POA: Diagnosis not present

## 2015-12-29 DIAGNOSIS — Z8261 Family history of arthritis: Secondary | ICD-10-CM | POA: Insufficient documentation

## 2015-12-29 DIAGNOSIS — Z9889 Other specified postprocedural states: Secondary | ICD-10-CM | POA: Insufficient documentation

## 2015-12-29 DIAGNOSIS — I5032 Chronic diastolic (congestive) heart failure: Secondary | ICD-10-CM | POA: Insufficient documentation

## 2015-12-29 DIAGNOSIS — Z7901 Long term (current) use of anticoagulants: Secondary | ICD-10-CM | POA: Diagnosis not present

## 2015-12-29 DIAGNOSIS — Z87891 Personal history of nicotine dependence: Secondary | ICD-10-CM | POA: Diagnosis not present

## 2015-12-29 DIAGNOSIS — I4819 Other persistent atrial fibrillation: Secondary | ICD-10-CM

## 2015-12-29 DIAGNOSIS — I4891 Unspecified atrial fibrillation: Secondary | ICD-10-CM | POA: Diagnosis present

## 2015-12-29 MED ORDER — METOPROLOL SUCCINATE ER 25 MG PO TB24: ORAL_TABLET | ORAL | 2 refills | Status: DC

## 2015-12-29 NOTE — Progress Notes (Signed)
Patient ID: Allison Sellers, female   DOB: Jul 15, 1926, 82 y.o.   MRN: [id number]      Primary Care Physician: Ann Held, DO Referring Physician:[facility]   Allison Sellers is a 82 y.o. female with a h/o PAF on multaq and metoprolol, with persistent afib  for 7-10 days at which time v rates were 125 bpm range.  The last time she had breakthrough afib was last June at which time she required cardioversion, 3 previous cardioversions prior to this. She does have interstitial lung disease which would  make her not  an ideal candidate for amiodarone use. She does have CKD, which would significantly limit dose of tiksoyn to the effect it may not be effective. She is not a ablation candidate due to age. Also not a flecainide/sotalol candidate due to renal function. She reports that she does not feel bad in afib and for most part since increase of metoprolol to 25 mg bid,  she has decent rate control and  BP is OK with higher dose . She is on lower dose of xarelto at 15 mg with cr cl calc at 31.0 ml/min.  She returns today with afib rate controlled. Discussed with Dr. Ellyn Hack and he would like to see if she can be returned to [place], so after  conversation with the pt and daughter, they would like to pursue cardioversion. She thinks she may have missed one dose of xarelto around January 7th, otherwise no other known missed doses. Unfortunately, due to above paragraph, she is not a good candidate for other antiarrhythmics, so will be left on multaq.  Called the office right before cardioversion and found to be in SR, so DCCV cancelled. She now returns 2/22 and is back in afib with v rate of 122. She is unaware of afib and most part feels well and the irregular heart beat does not bother her.I think since she is having so much afib, mostly asymptomatic, and not a good candidate for other antiarrythmic's, that multaq should be stopped and will attempt better rate control. She and family are in  agreement.She continues on xarelto.  Seen back in afib office 3/1. Last visit cardizem 120 mg qd was added and  multaq was stopped to aim for rate control. She continues to feel well. Does not notice afib. Able to do her house hold activities and occasionally will feel the need to rest for about 10 mins if she gets tired. No heart failure symptoms. She brings in her numbers form home and for the most part are 100 or less with some readings 65-70. Her heart rate is elevated here but she says that she is nervous on her visits.   Placed a monitor on pt to check for rate control and it showed afib with RVR with rates up to 190. I discussed with Dr. Rayann Heman and she is not a candidate for any rhythm control due to CKD, and amiodarone not an option due to pulmonary fibrosis. She is still tolerating this very well. Has rvr today, but is unaware. Not showing any signs of fluid overload. She only feels winded if she has to climb stairs. She shows me her HR/BP readings from home  and her heart rate for the most part in less that 100, with 2 readings of 124 and 132. I thinks she has sufficient BP to try to add another dose of cardizem 120 mg in the pm. To help with this, I will cut the lisinopril dose  in half for now.  After several attempts to rate control pt, she continued with afib with rvr. She was give an appointment with Dr. Rayann Heman for options. He wanted to try further rate control, ordered an echo, mentioned consideration use of flecainide but would require EKG and flecainide level within one week. Pt is feeling more short of breath with activities. Other wise BP is holding  with extra rate control meds. HR at home for most part is recorded less than 100 but with walking in room today, HR 140's but then settled down to around 100 with EKG. She is pending echo within one week and would like to make sure EF is normal before starting flecainide, with pt c/o of more shortness of breath. Last option would be per Dr.  Rayann Heman is AV nodal ablation and PPM.  She was last seen by Dr. Rayann Heman in May at which time her v rate was 123. He increased toprol to 50 mg am and 25 mg pm. She has tolerated this and v rate today is 108 bpm. She says that she feels well and does all her activities of daily living without issues. If she brings groceries up the steps, she has to sit and rest for several minutes before she can put them up.  Echo in May showed mild global reduction in LV function, biatrial enlargement, trace MR and TR. Reports v rates at home in the 70's/80's.  Today, she denies symptoms of palpitations, chest pain,orthopnea, PND, lower extremity edema, dizziness, presyncope, syncope, or neurologic sequela. Shortness of breath with activities. Weight is stable.The patient is tolerating medications without difficulties and is otherwise without complaint today.   Past Medical History:  Diagnosis Date  . Chronic diastolic CHF (congestive heart failure) ([place])   . CKD (chronic kidney disease), stage III   . Diverticular disease   . Hypertension   . Interstitial lung disease ([place])    a. Noted on CT scan 02/2011 before initiation of any antiarrhythmic meds.  . Osteopenia   . Persistent atrial fibrillation ([place])   . Seborrheic keratosis 06/2014   of hand.    Past Surgical History:  Procedure Laterality Date  . 2D Echocardiogram  03/29/2011   EF 55-65%, normal  . CARDIOVERSION  04/18/2011   Procedure: CARDIOVERSION;  Surgeon: Leonie Man;  Location: [place] OR;  Service: Cardiovascular;  Laterality: N/A;  . CARDIOVERSION N/A 11/20/2014   Procedure: CARDIOVERSION;  Surgeon: Jolaine Artist, MD;  Location: [place];  Service: Cardiovascular;  Laterality: N/A;  . COLONOSCOPY N/A 11/03/2014   Procedure: COLONOSCOPY;  Surgeon: Ladene Artist, MD;  Location: [facility] ENDOSCOPY;  Service: Endoscopy;  Laterality: N/A;  . Lexiscan Myoview  04/14/2011   No scintigraphic evidence of inducible myocardial ischemia, EKG negative for  ischemia, patient developed Atrial Fibrillation with rapid ventricular response during stress and persisted in the recovery period, abnormal myocardial perfusion study  . UMBILICAL HERNIA REPAIR  ? 2013    Current Outpatient Prescriptions  Medication Sig Dispense Refill  . diltiazem (CARDIZEM CD) 120 MG 24 hr capsule Take 2 capsules by mouth in the am and 1 capsule by mouth in the pm 90 capsule 6  . lisinopril (PRINIVIL,ZESTRIL) 5 MG tablet Take 0.5 tablets (2.5 mg total) by mouth daily. 90 tablet 3  . metoprolol succinate (TOPROL XL) 25 MG 24 hr tablet Take 2 tablets (50 mg total) in the a.m., then take 1 tablet (25 mg total) in the p.m. 270 tablet 2  . nitroGLYCERIN (NITROSTAT)  0.4 MG SL tablet Place 1 tablet (0.4 mg total) under the tongue every 5 (five) minutes as needed. For chest pain 25 tablet 8  . Rivaroxaban (XARELTO) 15 MG TABS tablet Take 1 tablet (15 mg total) by mouth daily. 90 tablet 3  . Vitamin D, Ergocalciferol, (DRISDOL) 50000 UNITS CAPS capsule Take 50,000 Units by mouth every 7 (seven) days. Wednesday     No current facility-administered medications for this encounter.     Allergies  Allergen Reactions  . Aspirin Other (See Comments)    Cannot have due to Xarelto and previous bleed  . Ciprofloxacin Other (See Comments)    Cant be given with her multaq    Social History   Social History  . Marital status: Widowed    Spouse name: N/A  . Number of children: N/A  . Years of education: N/A   Occupational History  . Not on file.   Social History Main Topics  . Smoking status: Never Smoker  . Smokeless tobacco: Never Used  . Alcohol use No  . Drug use: No  . Sexual activity: Not Currently   Other Topics Concern  . Not on file   Social History Narrative   She is a widowed mother of 68, grandmother of 65, great grandmother of 1. She is usually very active and likes to walk .    recently.    She quit smoking 50 years ago. She denies any alcohol.    She says in  order to try to make up for the fact she has not been doing much exercise she has been walking up and down the stairs at the house and staying as active as possible, just doing whatever activity she can do.     Family History  Problem Relation Age of Onset  . Arthritis    . Heart failure Father     CHF  . Heart failure Mother     CHF  . Macular degeneration Mother     ROS- All systems are reviewed and negative except as per the HPI above  Physical Exam: Vitals:   12/29/15 1400  BP: 126/82  Pulse: (!) 108  Weight: 135 lb 6.4 oz (61.4 kg)  Height: 5' (1.524 m)    GEN- The patient is well appearing, alert and oriented x 3 today.   Head- normocephalic, atraumatic Eyes-  Sclera clear, conjunctiva pink Ears- hearing intact Oropharynx- clear Neck- supple, no JVP Lymph- no cervical lymphadenopathy Lungs- Clear to ausculation bilaterally, normal work of breathing Heart- Rapid, irregular rate and rhythm, no murmurs, rubs or gallops, PMI not laterally displaced GI- soft, NT, ND, + BS Extremities- no clubbing, cyanosis, or edema MS- no significant deformity or atrophy Skin- no rash or lesion Psych- euthymic mood, full affect Neuro- strength and sensation are intact  EKG- afib at 108 bpm, qrs int 76 ms, qtc 447 ms Epic records reviewed  Assessment and Plan:  1. Afib,chronic  Continue rate control as outlined per Dr. Rayann Heman For today will leave rate control unchanged but can go up on BB if v rate dictates if BP tolerates Pt will let me know if v rates become consistently over 100  F/u in 3 months   Butch Penny C. Maurisio Ruddy, [facility] [address] [place], [place] [address] [phone]

## 2016-03-29 ENCOUNTER — Encounter (HOSPITAL_COMMUNITY): Payer: Self-pay | Admitting: Nurse Practitioner

## 2016-03-29 ENCOUNTER — Ambulatory Visit (HOSPITAL_COMMUNITY)
Admission: RE | Admit: 2016-03-29 | Discharge: 2016-03-29 | Disposition: A | Discharge status: Home / Self Care | Payer: Medicare Other | Source: Ambulatory Visit | Attending: Nurse Practitioner | Admitting: Nurse Practitioner

## 2016-03-29 VITALS — BP 130/78 | HR 110 | Ht 60.0 in | Wt 134.6 lb

## 2016-03-29 DIAGNOSIS — I482 Chronic atrial fibrillation: Secondary | ICD-10-CM | POA: Diagnosis not present

## 2016-03-29 DIAGNOSIS — Z7901 Long term (current) use of anticoagulants: Secondary | ICD-10-CM | POA: Insufficient documentation

## 2016-03-29 DIAGNOSIS — N183 Chronic kidney disease, stage 3 (moderate): Secondary | ICD-10-CM | POA: Insufficient documentation

## 2016-03-29 DIAGNOSIS — J849 Interstitial pulmonary disease, unspecified: Secondary | ICD-10-CM | POA: Diagnosis not present

## 2016-03-29 DIAGNOSIS — I481 Persistent atrial fibrillation: Secondary | ICD-10-CM | POA: Insufficient documentation

## 2016-03-29 DIAGNOSIS — Z87891 Personal history of nicotine dependence: Secondary | ICD-10-CM | POA: Insufficient documentation

## 2016-03-29 DIAGNOSIS — Z79899 Other long term (current) drug therapy: Secondary | ICD-10-CM | POA: Diagnosis not present

## 2016-03-29 DIAGNOSIS — I130 Hypertensive heart and chronic kidney disease with heart failure and stage 1 through stage 4 chronic kidney disease, or unspecified chronic kidney disease: Secondary | ICD-10-CM | POA: Insufficient documentation

## 2016-03-29 DIAGNOSIS — I4821 Permanent atrial fibrillation: Secondary | ICD-10-CM

## 2016-03-29 DIAGNOSIS — I5032 Chronic diastolic (congestive) heart failure: Secondary | ICD-10-CM | POA: Insufficient documentation

## 2016-03-29 NOTE — Progress Notes (Signed)
Patient ID: Allison Sellers, female   DOB: Jul 15, 1926, 82 y.o.   MRN: [id number]      Primary Care Physician: Ann Held, DO Referring Physician:[facility]   Allison Sellers is a 82 y.o. female with a h/o PAF on multaq and metoprolol, with persistent afib  for 7-10 days at which time v rates were 125 bpm range.  The last time she had breakthrough afib was last June at which time she required cardioversion, 3 previous cardioversions prior to this. She does have interstitial lung disease which would  make her not  an ideal candidate for amiodarone use. She does have CKD, which would significantly limit dose of tiksoyn to the effect it may not be effective. She is not a ablation candidate due to age. Also not a flecainide/sotalol candidate due to renal function. She reports that she does not feel bad in afib and for most part since increase of metoprolol to 25 mg bid,  she has decent rate control and  BP is OK with higher dose . She is on lower dose of xarelto at 15 mg with cr cl calc at 31.0 ml/min.  She returns today with afib rate controlled. Discussed with Dr. Ellyn Hack and he would like to see if she can be returned to [place], so after  conversation with the pt and daughter, they would like to pursue cardioversion. She thinks she may have missed one dose of xarelto around January 7th, otherwise no other known missed doses. Unfortunately, due to above paragraph, she is not a good candidate for other antiarrhythmics, so will be left on multaq.  Called the office right before cardioversion and found to be in SR, so DCCV cancelled. She now returns 2/22 and is back in afib with v rate of 122. She is unaware of afib and most part feels well and the irregular heart beat does not bother her.I think since she is having so much afib, mostly asymptomatic, and not a good candidate for other antiarrythmic's, that multaq should be stopped and will attempt better rate control. She and family are in  agreement.She continues on xarelto.  Seen back in afib office 3/1. Last visit cardizem 120 mg qd was added and  multaq was stopped to aim for rate control. She continues to feel well. Does not notice afib. Able to do her house hold activities and occasionally will feel the need to rest for about 10 mins if she gets tired. No heart failure symptoms. She brings in her numbers form home and for the most part are 100 or less with some readings 65-70. Her heart rate is elevated here but she says that she is nervous on her visits.   Placed a monitor on pt to check for rate control and it showed afib with RVR with rates up to 190. I discussed with Dr. Rayann Heman and she is not a candidate for any rhythm control due to CKD, and amiodarone not an option due to pulmonary fibrosis. She is still tolerating this very well. Has rvr today, but is unaware. Not showing any signs of fluid overload. She only feels winded if she has to climb stairs. She shows me her HR/BP readings from home  and her heart rate for the most part in less that 100, with 2 readings of 124 and 132. I thinks she has sufficient BP to try to add another dose of cardizem 120 mg in the pm. To help with this, I will cut the lisinopril dose  in half for now.  After several attempts to rate control pt, she continued with afib with rvr. She was give an appointment with Dr. Rayann Heman for options. He wanted to try further rate control, ordered an echo, mentioned consideration use of flecainide but would require EKG and flecainide level within one week. Pt is feeling more short of breath with activities. Other wise BP is holding  with extra rate control meds. HR at home for most part is recorded less than 100 but with walking in room today, HR 140's but then settled down to around 100 with EKG. She is pending echo within one week and would like to make sure EF is normal before starting flecainide, with pt c/o of more shortness of breath. Last option would be per Dr.  Rayann Heman is AV nodal ablation and PPM.  She was last seen by Dr. Rayann Heman in May at which time her v rate was 123. He increased toprol to 50 mg am and 25 mg pm. She has tolerated this and v rate today is 108 bpm. She says that she feels well and does all her activities of daily living without issues. If she brings groceries up the steps, she has to sit and rest for several minutes before she can put them up.  Echo in May showed mild global reduction in LV function, biatrial enlargement, trace MR and TR. Reports v rates at home in the 70's/80's.  F/u in afib clinic, 11/1, pt continues to do well with long standing permanent afib. She will be 89 in a few weeks and still drives around her neighborhood, and takes care of her house. She rarely feels the afib. HR is 110 in clinic but pt states that HR when she checks in lower than 100. No bleeding issues.  Today, she denies symptoms of palpitations, chest pain,orthopnea, PND, lower extremity edema, dizziness, presyncope, syncope, or neurologic sequela. Shortness of breath with activities. Weight is stable.The patient is tolerating medications without difficulties and is otherwise without complaint today.   Past Medical History:  Diagnosis Date  . Chronic diastolic CHF (congestive heart failure) ([place])   . CKD (chronic kidney disease), stage III   . Diverticular disease   . Hypertension   . Interstitial lung disease ([place])    a. Noted on CT scan 02/2011 before initiation of any antiarrhythmic meds.  . Osteopenia   . Persistent atrial fibrillation ([place])   . Seborrheic keratosis 06/2014   of hand.    Past Surgical History:  Procedure Laterality Date  . 2D Echocardiogram  03/29/2011   EF 55-65%, normal  . CARDIOVERSION  04/18/2011   Procedure: CARDIOVERSION;  Surgeon: Leonie Man;  Location: [facility];  Service: Cardiovascular;  Laterality: N/A;  . CARDIOVERSION N/A 11/20/2014   Procedure: CARDIOVERSION;  Surgeon: Jolaine Artist, MD;  Location: [facility];   Service: Cardiovascular;  Laterality: N/A;  . COLONOSCOPY N/A 11/03/2014   Procedure: COLONOSCOPY;  Surgeon: Ladene Artist, MD;  Location[facility] END[facility]COPY;  Service: Endoscopy;  Laterality: N/A;  . Lexiscan Myoview  04/14/2011   No scintigraphic evidence of inducible myocardial ischemia, EKG negative for ischemia, patient developed Atrial Fibrillation with rapid ventricular response during stress and persisted in the recovery period, abnormal myocardial perfusion study  . UMBILICAL HERNIA REPAIR  ? 2013    Current Outpatient Prescriptions  Medication Sig Dispense Refill  . diltiazem (CARDIZEM CD) 120 MG 24 hr capsule Take 2 capsules by mouth in the am and 1 capsule by mouth in  the pm 90 capsule 6  . lisinopril (PRINIVIL,ZESTRIL) 5 MG tablet Take 0.5 tablets (2.5 mg total) by mouth daily. 90 tablet 3  . metoprolol succinate (TOPROL XL) 25 MG 24 hr tablet Take 2 tablets (50 mg total) in the a.m., then take 1 tablet (25 mg total) in the p.m. 270 tablet 2  . nitroGLYCERIN (NITROSTAT) 0.4 MG SL tablet Place 1 tablet (0.4 mg total) under the tongue every 5 (five) minutes as needed. For chest pain 25 tablet 8  . Rivaroxaban (XARELTO) 15 MG TABS tablet Take 1 tablet (15 mg total) by mouth daily. 90 tablet 3  . Vitamin D, Ergocalciferol, (DRISDOL) 50000 UNITS CAPS capsule Take 50,000 Units by mouth every 7 (seven) days. Wednesday     No current facility-administered medications for this encounter.     Allergies  Allergen Reactions  . Aspirin Other (See Comments)    Cannot have due to Xarelto and previous bleed  . Ciprofloxacin Other (See Comments)    Cant be given with her multaq    Social History   Social History  . Marital status: Widowed    Spouse name: N/A  . Number of children: N/A  . Years of education: N/A   Occupational History  . Not on file.   Social History Main Topics  . Smoking status: Never Smoker  . Smokeless tobacco: Never Used  . Alcohol use No  . Drug use: No  .  Sexual activity: Not Currently   Other Topics Concern  . Not on file   Social History Narrative   She is a widowed mother of 20, grandmother of 72, great grandmother of 1. She is usually very active and likes to walk .    recently.    She quit smoking 50 years ago. She denies any alcohol.    She says in order to try to make up for the fact she has not been doing much exercise she has been walking up and down the stairs at the house and staying as active as possible, just doing whatever activity she can do.     Family History  Problem Relation Age of Onset  . Arthritis    . Heart failure Father     CHF  . Heart failure Mother     CHF  . Macular degeneration Mother     ROS- All systems are reviewed and negative except as per the HPI above  Physical Exam: Vitals:   03/29/16 0910  BP: 130/78  Pulse: (!) 110  Weight: 134 lb 9.6 oz (61.1 kg)  Height: 5' (1.524 m)    GEN- The patient is well appearing, alert and oriented x 3 today.   Head- normocephalic, atraumatic Eyes-  Sclera clear, conjunctiva pink Ears- hearing intact Oropharynx- clear Neck- supple, no JVP Lymph- no cervical lymphadenopathy Lungs- Clear to ausculation bilaterally, normal work of breathing, few crackles in bases (chronic interstitial  lung disease) Heart- Rapid, irregular rate and rhythm, no murmurs, rubs or gallops, PMI not laterally displaced GI- soft, NT, ND, + BS Extremities- no clubbing, cyanosis, or edema MS- no significant deformity or atrophy Skin- no rash or lesion Psych- euthymic mood, full affect Neuro- strength and sensation are intact  EKG- afib at 108 bpm, qrs int 76 ms, qtc 447 ms Epic records reviewed  Assessment and Plan:  1. Afib,longstanding permanent Tolerating well Continue rate control as outlined per Dr. Rayann Heman Continue xarelto  F/u in 6 weeks  Geroge Baseman. Shantrell Placzek, [place]  [facility] [address] [place], [place] [address] [phone]

## 2016-03-30 ENCOUNTER — Telehealth: Payer: Self-pay | Admitting: Family Medicine

## 2016-03-30 ENCOUNTER — Encounter: Payer: Self-pay | Admitting: Family Medicine

## 2016-03-30 NOTE — Telephone Encounter (Signed)
Mailed letter to patient informing that Dr. Carollee Herter will be out of the office on May 16, 2016 and appointments need to be rescheduled. Informed patient to call our office to reschedule. Appointment cancelled

## 2016-04-05 ENCOUNTER — Emergency Department (HOSPITAL_COMMUNITY): Payer: Medicare Other

## 2016-04-05 ENCOUNTER — Encounter (HOSPITAL_COMMUNITY): Payer: Self-pay | Admitting: *Deleted

## 2016-04-05 ENCOUNTER — Inpatient Hospital Stay (HOSPITAL_COMMUNITY)
Admission: EM | Admit: 2016-04-05 | Discharge: 2016-04-07 | DRG: 309 | Disposition: A | Discharge status: Home Health | Payer: Medicare Other | Attending: Internal Medicine | Admitting: Internal Medicine

## 2016-04-05 DIAGNOSIS — Z881 Allergy status to other antibiotic agents status: Secondary | ICD-10-CM

## 2016-04-05 DIAGNOSIS — I4891 Unspecified atrial fibrillation: Secondary | ICD-10-CM | POA: Diagnosis present

## 2016-04-05 DIAGNOSIS — M8580 Other specified disorders of bone density and structure, unspecified site: Secondary | ICD-10-CM | POA: Diagnosis present

## 2016-04-05 DIAGNOSIS — Z886 Allergy status to analgesic agent status: Secondary | ICD-10-CM | POA: Diagnosis not present

## 2016-04-05 DIAGNOSIS — I4819 Other persistent atrial fibrillation: Secondary | ICD-10-CM

## 2016-04-05 DIAGNOSIS — I480 Paroxysmal atrial fibrillation: Secondary | ICD-10-CM | POA: Diagnosis present

## 2016-04-05 DIAGNOSIS — Z87891 Personal history of nicotine dependence: Secondary | ICD-10-CM | POA: Diagnosis not present

## 2016-04-05 DIAGNOSIS — Z7901 Long term (current) use of anticoagulants: Secondary | ICD-10-CM | POA: Diagnosis not present

## 2016-04-05 DIAGNOSIS — Y92009 Unspecified place in unspecified non-institutional (private) residence as the place of occurrence of the external cause: Secondary | ICD-10-CM

## 2016-04-05 DIAGNOSIS — M549 Dorsalgia, unspecified: Secondary | ICD-10-CM | POA: Diagnosis present

## 2016-04-05 DIAGNOSIS — I130 Hypertensive heart and chronic kidney disease with heart failure and stage 1 through stage 4 chronic kidney disease, or unspecified chronic kidney disease: Secondary | ICD-10-CM | POA: Diagnosis present

## 2016-04-05 DIAGNOSIS — R0902 Hypoxemia: Secondary | ICD-10-CM | POA: Diagnosis present

## 2016-04-05 DIAGNOSIS — Z66 Do not resuscitate: Secondary | ICD-10-CM | POA: Diagnosis present

## 2016-04-05 DIAGNOSIS — Z8249 Family history of ischemic heart disease and other diseases of the circulatory system: Secondary | ICD-10-CM

## 2016-04-05 DIAGNOSIS — I482 Chronic atrial fibrillation: Secondary | ICD-10-CM | POA: Diagnosis present

## 2016-04-05 DIAGNOSIS — S22080A Wedge compression fracture of T11-T12 vertebra, initial encounter for closed fracture: Secondary | ICD-10-CM

## 2016-04-05 DIAGNOSIS — W010XXA Fall on same level from slipping, tripping and stumbling without subsequent striking against object, initial encounter: Secondary | ICD-10-CM | POA: Diagnosis present

## 2016-04-05 DIAGNOSIS — D72829 Elevated white blood cell count, unspecified: Secondary | ICD-10-CM | POA: Diagnosis present

## 2016-04-05 DIAGNOSIS — J8410 Pulmonary fibrosis, unspecified: Secondary | ICD-10-CM | POA: Diagnosis present

## 2016-04-05 DIAGNOSIS — N183 Chronic kidney disease, stage 3 (moderate): Secondary | ICD-10-CM | POA: Diagnosis present

## 2016-04-05 DIAGNOSIS — S22089A Unspecified fracture of T11-T12 vertebra, initial encounter for closed fracture: Secondary | ICD-10-CM | POA: Diagnosis present

## 2016-04-05 DIAGNOSIS — I5032 Chronic diastolic (congestive) heart failure: Secondary | ICD-10-CM | POA: Diagnosis present

## 2016-04-05 DIAGNOSIS — S22009A Unspecified fracture of unspecified thoracic vertebra, initial encounter for closed fracture: Secondary | ICD-10-CM | POA: Diagnosis not present

## 2016-04-05 DIAGNOSIS — E871 Hypo-osmolality and hyponatremia: Secondary | ICD-10-CM | POA: Diagnosis present

## 2016-04-05 DIAGNOSIS — Z79899 Other long term (current) drug therapy: Secondary | ICD-10-CM | POA: Diagnosis not present

## 2016-04-05 DIAGNOSIS — F419 Anxiety disorder, unspecified: Secondary | ICD-10-CM | POA: Diagnosis present

## 2016-04-05 DIAGNOSIS — I481 Persistent atrial fibrillation: Secondary | ICD-10-CM | POA: Diagnosis present

## 2016-04-05 DIAGNOSIS — S22000A Wedge compression fracture of unspecified thoracic vertebra, initial encounter for closed fracture: Secondary | ICD-10-CM

## 2016-04-05 LAB — URINALYSIS, ROUTINE W REFLEX MICROSCOPIC
Bilirubin Urine: NEGATIVE
Glucose, UA: NEGATIVE mg/dL
Hgb urine dipstick: NEGATIVE
KETONES UR: NEGATIVE mg/dL
LEUKOCYTES UA: NEGATIVE
NITRITE: NEGATIVE
PH: 6 (ref 5.0–8.0)
PROTEIN: 100 mg/dL — AB
Specific Gravity, Urine: 1.025 (ref 1.005–1.030)

## 2016-04-05 LAB — CBC WITH DIFFERENTIAL/PLATELET
BASOS ABS: 0 10*3/uL (ref 0.0–0.1)
BASOS PCT: 0 %
Eosinophils Absolute: 0.1 10*3/uL (ref 0.0–0.7)
Eosinophils Relative: 1 %
HEMATOCRIT: 45.7 % (ref 36.0–46.0)
HEMOGLOBIN: 15.5 g/dL — AB (ref 12.0–15.0)
LYMPHS PCT: 10 %
Lymphs Abs: 1.1 10*3/uL (ref 0.7–4.0)
MCH: 32.3 pg (ref 26.0–34.0)
MCHC: 33.9 g/dL (ref 30.0–36.0)
MCV: 95.2 fL (ref 78.0–100.0)
Monocytes Absolute: 1.1 10*3/uL — ABNORMAL HIGH (ref 0.1–1.0)
Monocytes Relative: 9 %
NEUTROS ABS: 9.2 10*3/uL — AB (ref 1.7–7.7)
NEUTROS PCT: 80 %
Platelets: 277 10*3/uL (ref 150–400)
RBC: 4.8 MIL/uL (ref 3.87–5.11)
RDW: 14.1 % (ref 11.5–15.5)
WBC: 11.5 10*3/uL — ABNORMAL HIGH (ref 4.0–10.5)

## 2016-04-05 LAB — BASIC METABOLIC PANEL
ANION GAP: 8 (ref 5–15)
BUN: 21 mg/dL — ABNORMAL HIGH (ref 6–20)
CHLORIDE: 104 mmol/L (ref 101–111)
CO2: 21 mmol/L — AB (ref 22–32)
Calcium: 8.8 mg/dL — ABNORMAL LOW (ref 8.9–10.3)
Creatinine, Ser: 1.19 mg/dL — ABNORMAL HIGH (ref 0.44–1.00)
GFR calc non Af Amer: 40 mL/min — ABNORMAL LOW (ref >60)
GFR, EST AFRICAN AMERICAN: 47 mL/min — AB (ref >60)
Glucose, Bld: 119 mg/dL — ABNORMAL HIGH (ref 65–99)
POTASSIUM: 4.4 mmol/L (ref 3.5–5.1)
Sodium: 133 mmol/L — ABNORMAL LOW (ref 135–145)

## 2016-04-05 LAB — TSH: TSH: 1.93 u[IU]/mL (ref 0.350–4.500)

## 2016-04-05 LAB — URINE MICROSCOPIC-ADD ON
BACTERIA UA: NONE SEEN
RBC / HPF: NONE SEEN RBC/hpf (ref 0–5)

## 2016-04-05 LAB — MAGNESIUM: Magnesium: 2.1 mg/dL (ref 1.7–2.4)

## 2016-04-05 MED ORDER — ACETAMINOPHEN 325 MG PO TABS: 650.0000 mg | ORAL_TABLET | ORAL | Status: DC | PRN

## 2016-04-05 MED ORDER — RIVAROXABAN 15 MG PO TABS
15.0000 mg | ORAL_TABLET | Freq: Every day | ORAL | Status: DC
  Administered 2016-04-05 – 2016-04-06 (×2): 15 mg via ORAL
  Filled 2016-04-05 (×2): qty 1

## 2016-04-05 MED ORDER — ACETAMINOPHEN 325 MG PO TABS
650.0000 mg | ORAL_TABLET | Freq: Once | ORAL | Status: DC
  Filled 2016-04-05: qty 2

## 2016-04-05 MED ORDER — DOCUSATE SODIUM 100 MG PO CAPS: 100.0000 mg | ORAL_CAPSULE | Freq: Two times a day (BID) | ORAL | 0 refills | Status: DC

## 2016-04-05 MED ORDER — ONDANSETRON HCL 4 MG/2ML IJ SOLN: 4.0000 mg | Freq: Four times a day (QID) | INTRAMUSCULAR | Status: DC | PRN

## 2016-04-05 MED ORDER — METOPROLOL SUCCINATE ER 25 MG PO TB24
25.0000 mg | ORAL_TABLET | Freq: Every day | ORAL | Status: DC
  Administered 2016-04-05 – 2016-04-06 (×2): 25 mg via ORAL
  Filled 2016-04-05 (×3): qty 1

## 2016-04-05 MED ORDER — DILTIAZEM HCL 25 MG/5ML IV SOLN
10.0000 mg | Freq: Once | INTRAVENOUS | Status: AC
  Administered 2016-04-05: 10 mg via INTRAVENOUS
  Filled 2016-04-05 (×2): qty 5

## 2016-04-05 MED ORDER — DILTIAZEM HCL ER COATED BEADS 120 MG PO CP24: 120.0000 mg | ORAL_CAPSULE | Freq: Two times a day (BID) | ORAL | Status: DC

## 2016-04-05 MED ORDER — METOPROLOL SUCCINATE ER 50 MG PO TB24
50.0000 mg | ORAL_TABLET | Freq: Every day | ORAL | Status: DC
  Administered 2016-04-06: 50 mg via ORAL
  Filled 2016-04-05: qty 1

## 2016-04-05 MED ORDER — DILTIAZEM HCL-DEXTROSE 100-5 MG/100ML-% IV SOLN (PREMIX): 5.0000 mg/h | INTRAVENOUS | Status: DC

## 2016-04-05 MED ORDER — INFLUENZA VAC SPLIT QUAD 0.5 ML IM SUSY: 0.5000 mL | PREFILLED_SYRINGE | INTRAMUSCULAR | Status: DC | PRN

## 2016-04-05 MED ORDER — OXYCODONE-ACETAMINOPHEN 5-325 MG PO TABS: 1.0000 | ORAL_TABLET | Freq: Four times a day (QID) | ORAL | 0 refills | Status: DC | PRN

## 2016-04-05 MED ORDER — FENTANYL CITRATE (PF) 100 MCG/2ML IJ SOLN
50.0000 ug | Freq: Once | INTRAMUSCULAR | Status: AC
  Administered 2016-04-05: 50 ug via INTRAVENOUS
  Filled 2016-04-05: qty 2

## 2016-04-05 MED ORDER — METOPROLOL SUCCINATE ER 50 MG PO TB24: 50.0000 mg | ORAL_TABLET | Freq: Two times a day (BID) | ORAL | Status: DC

## 2016-04-05 MED ORDER — DILTIAZEM LOAD VIA INFUSION
10.0000 mg | Freq: Once | INTRAVENOUS | Status: AC
  Administered 2016-04-05: 10 mg via INTRAVENOUS
  Filled 2016-04-05: qty 10

## 2016-04-05 MED ORDER — RIVAROXABAN 15 MG PO TABS: 15.0000 mg | ORAL_TABLET | Freq: Every day | ORAL | Status: DC

## 2016-04-05 MED ORDER — DILTIAZEM HCL ER COATED BEADS 240 MG PO CP24: 240.0000 mg | ORAL_CAPSULE | Freq: Every day | ORAL | Status: DC

## 2016-04-05 MED ORDER — TRAMADOL HCL 50 MG PO TABS
50.0000 mg | ORAL_TABLET | Freq: Two times a day (BID) | ORAL | Status: DC
  Administered 2016-04-05 – 2016-04-07 (×4): 50 mg via ORAL
  Filled 2016-04-05 (×4): qty 1

## 2016-04-05 MED ORDER — HYDROCODONE-ACETAMINOPHEN 5-325 MG PO TABS
1.0000 | ORAL_TABLET | Freq: Four times a day (QID) | ORAL | Status: DC | PRN
  Administered 2016-04-05 – 2016-04-07 (×4): 1 via ORAL
  Filled 2016-04-05 (×4): qty 1

## 2016-04-05 MED ORDER — TRAMADOL HCL 50 MG PO TABS
50.0000 mg | ORAL_TABLET | Freq: Four times a day (QID) | ORAL | Status: DC
  Administered 2016-04-05: 50 mg via ORAL
  Filled 2016-04-05: qty 1

## 2016-04-05 MED ORDER — DILTIAZEM HCL ER COATED BEADS 120 MG PO CP24
120.0000 mg | ORAL_CAPSULE | Freq: Every day | ORAL | Status: DC
  Filled 2016-04-05: qty 1

## 2016-04-05 MED ORDER — DILTIAZEM HCL-DEXTROSE 100-5 MG/100ML-% IV SOLN (PREMIX)
5.0000 mg/h | INTRAVENOUS | Status: DC
  Administered 2016-04-05: 5 mg/h via INTRAVENOUS
  Administered 2016-04-06: 2.5 mg/h via INTRAVENOUS
  Filled 2016-04-05 (×2): qty 100

## 2016-04-05 NOTE — ED Provider Notes (Signed)
82 year old female with a history of atrial fib who presented last night for concern of fall and thoracic vertebrae fracture by Dr. Dina Rich, has been awaiting thoracic brace placement. Please see prior note for full history and physical.  Patient placed in TLSO brace and was ready for discharge, however discharge vital signs show HR of 160s.  Patient is asymptomatic.  Denies palpitations, dyspnea, lightheadedness. Blood pressures remain WNL.  She has received her home medications this AM.  EKG shows atrial fibrillation with RVR. She was given a diltiazem bolus, with no effect. She was then given dilt bolus and drip. Cardiology was consulted, and medicine was consulted for admission for further care.  Hx of difficult to control afib. On dilt gtt, HR improved. Admitted to stepdown for further care.    EKG Interpretation  Date/Time:  Wednesday April 05 2016 11:00:31 EST Ventricular Rate:  165 PR Interval:    QRS Duration: 75 QT Interval:  230 QTC Calculation: 381 R Axis:   88 Text Interpretation:  Atrial fibrillation with rapid V-rate Ventricular premature complex Aberrant complex Borderline right axis deviation Repolarization abnormality, prob rate related Since prior ECG, rate has increased Confirmed by [facility] MD, Junie Panning ([id number]) on 04/05/2016 11:27:02 AM Also confirmed by [facility] MD, [place] ([id number]), editor Rolla Plate, Joelene Millin [phone])  on 04/05/2016 11:30:39 AM        CRITICAL CARE:afib RVR Performed by: Alvino Chapel   Total critical care time: 30 minutes  Critical care time was exclusive of separately billable procedures and treating other patients.  Critical care was necessary to treat or prevent imminent or life-threatening deterioration.  Critical care was time spent personally by me on the following activities: development of treatment plan with patient and/or surrogate as well as nursing, discussions with consultants, evaluation of patient's response to treatment,  examination of patient, obtaining history from patient or surrogate, ordering and performing treatments and interventions, ordering and review of laboratory studies, ordering and review of radiographic studies, pulse oximetry and re-evaluation of patient's condition.    Gareth Morgan, MD 04/05/16 2310

## 2016-04-05 NOTE — ED Notes (Signed)
Patient transported to X-ray 

## 2016-04-05 NOTE — ED Provider Notes (Signed)
[facility] DEPT Provider Note   CSN: [id number] Arrival date & time: 04/05/16  0449     History   Chief Complaint Chief Complaint  Patient presents with  . Fall  . Back Pain    HPI MEHLANI MOCERI is a 82 y.o. female.  HPI  This is an 82 year old female with a history of heart failure, atrial fibrillation on Xarelto who presents following a fall. Patient reports that she woke up to go the restroom at 2:00. She tripped and fell. She reports that she fell forwards. She denies hitting her head or loss of consciousness. She denies syncope. She was able to get back in bed on her own; however, upon trying to get out of bed, she had trickling back pain. She reports lower back pain that is worse with movement. She denies any weakness, numbness, tingling of the lower extremities. She denies falling onto her buttock or back during the fall. Currently she rates her pain at 10 out of 10. She has not taken anything for the pain.  Past Medical History:  Diagnosis Date  . Chronic diastolic CHF (congestive heart failure) ([place])   . CKD (chronic kidney disease), stage III   . Diverticular disease   . Hypertension   . Interstitial lung disease ([place])    a. Noted on CT scan 02/2011 before initiation of any antiarrhythmic meds.  . Osteopenia   . Persistent atrial fibrillation ([place])   . Seborrheic keratosis 06/2014   of hand.     Patient Active Problem List   Diagnosis Date Noted  . CKD (chronic kidney disease), stage III   . Chronic diastolic CHF (congestive heart failure) ([place])   . PAF (paroxysmal atrial fibrillation) ([place])   . Hyponatremia 11/16/2014  . Rectal bleeding 11/01/2014  . Hyperkalemia 11/01/2014  . GI bleed 11/01/2014  . Chronic anticoagulation 02/17/2013  . Diastolic dysfunction- grade 2 by echo Oct 2012 02/17/2013  . Persistent atrial fibrillation ([place]) 04/18/2011  . Essential hypertension 12/26/2007  . Disorder of bone and cartilage 12/26/2007    Past Surgical History:   Procedure Laterality Date  . 2D Echocardiogram  03/29/2011   EF 55-65%, normal  . CARDIOVERSION  04/18/2011   Procedure: CARDIOVERSION;  Surgeon: Leonie Man;  Location: [facility] OR;  Service: Cardiovascular;  Laterality: N/A;  . CARDIOVERSION N/A 11/20/2014   Procedure: CARDIOVERSION;  Surgeon: Jolaine Artist, MD;  Location: [facility];  Service: Cardiovascular;  Laterality: N/A;  . COLONOSCOPY N/A 11/03/2014   Procedure: COLONOSCOPY;  Surgeon: Ladene Artist, MD;  Location: [facility] ENDOSCOPY;  Service: Endoscopy;  Laterality: N/A;  . Lexiscan Myoview  04/14/2011   No scintigraphic evidence of inducible myocardial ischemia, EKG negative for ischemia, patient developed Atrial Fibrillation with rapid ventricular response during stress and persisted in the recovery period, abnormal myocardial perfusion study  . UMBILICAL HERNIA REPAIR  ? 2013    OB History    No data available       Home Medications    Prior to Admission medications   Medication Sig Start Date End Date Taking? Authorizing Provider  diltiazem (CARDIZEM CD) 120 MG 24 hr capsule Take 2 capsules by mouth in the am and 1 capsule by mouth in the pm 09/29/15   Thompson Grayer, MD  lisinopril (PRINIVIL,ZESTRIL) 5 MG tablet Take 0.5 tablets (2.5 mg total) by mouth daily. 08/18/15   Sherran Needs, NP  metoprolol succinate (TOPROL XL) 25 MG 24 hr tablet Take 2 tablets (50 mg total) in the  a.m., then take 1 tablet (25 mg total) in the p.m. 12/29/15   Sherran Needs, NP  nitroGLYCERIN (NITROSTAT) 0.4 MG SL tablet Place 1 tablet (0.4 mg total) under the tongue every 5 (five) minutes as needed. For chest pain 08/18/15   Sherran Needs, NP  Rivaroxaban (XARELTO) 15 MG TABS tablet Take 1 tablet (15 mg total) by mouth daily. 05/26/15   Leonie Man, MD  Vitamin D, Ergocalciferol, (DRISDOL) 50000 UNITS CAPS capsule Take 50,000 Units by mouth every 7 (seven) days. Wednesday    Historical Provider, MD    Family History Family History  Problem  Relation Age of Onset  . Heart failure Father     CHF  . Heart failure Mother     CHF  . Macular degeneration Mother   . Arthritis      Social History Social History  Substance Use Topics  . Smoking status: Never Smoker  . Smokeless tobacco: Never Used  . Alcohol use No     Allergies   Aspirin and Ciprofloxacin   Review of Systems Review of Systems  Constitutional: Negative for fever.  Respiratory: Negative for shortness of breath.   Cardiovascular: Negative for chest pain.  Genitourinary: Negative for hematuria.  Musculoskeletal: Positive for back pain. Negative for neck pain.  Skin: Negative for wound.  Neurological: Negative for dizziness, syncope and weakness.  All other systems reviewed and are negative.    Physical Exam Updated Vital Signs BP (!) 147/122 (BP Location: Right Arm)   Pulse 82   Temp 97.8 F (36.6 C) (Oral)   Resp 24   Ht 5' (1.524 m)   Wt 130 lb (59 kg)   BMI 25.39 kg/m   Physical Exam  Constitutional: She is oriented to person, place, and time. She appears well-developed and well-nourished.  Elderly, no acute distress  HENT:  Head: Normocephalic and atraumatic.  Eyes: Pupils are equal, round, and reactive to light.  Neck: Normal range of motion.  No midline C-spine tenderness, step-off, or deformity  Cardiovascular: Normal rate and normal heart sounds.   Irregular rhythm  Pulmonary/Chest: Effort normal and breath sounds normal. No respiratory distress. She has no wheezes.  Abdominal: Soft. Bowel sounds are normal. There is no tenderness. There is no guarding.  Musculoskeletal:  Tenderness to palpation lower T and upper lumbar spine without step off or deformity, bilateral paraspinous muscle tenderness with spasm, normal range of motion of bilateral hips and knees  Neurological: She is alert and oriented to person, place, and time.  Skin: Skin is warm and dry.  Psychiatric: She has a normal mood and affect.  Nursing note and vitals  reviewed.    ED Treatments / Results  Labs (all labs ordered are listed, but only abnormal results are displayed) Labs Reviewed  URINALYSIS, ROUTINE W REFLEX MICROSCOPIC (NOT AT [facility])  CBC WITH DIFFERENTIAL/PLATELET  BASIC METABOLIC PANEL    EKG  EKG Interpretation None       Radiology Dg Lumbar Spine Complete  Result Date: 04/05/2016 CLINICAL DATA:  [place] at home this morning. Moderate to severe mid low back pain worse with movement. EXAM: LUMBAR SPINE - COMPLETE 4+ VIEW COMPARISON:  06/16/2004 FINDINGS: Mild lumbar scoliosis convex towards the right. Diffuse degenerative change throughout the lumbar spine with narrowed interspaces and endplate hypertrophic changes. No anterior subluxation of the vertebrae. Degenerative changes in the facet joints. There is mild compression of the superior endplates of 624THL and 624THL. This is occurred in the interval since  the prior study and acute compression is not excluded. Changes likely to rest present osteoporosis. No retropulsion of fracture fragments. The bone cortex appears intact. No destructive bone lesions. Visualize sacrum appears intact. Aortic atherosclerosis. Pelvic calcifications consistent with uterine fibroids. IMPRESSION: Degenerative changes and scoliosis of the lumbar spine similar to previous study. Endplate compression deformities at T11 and T12 liver occurred since previous study and could be acute or chronic. Changes likely due to osteoporosis. Electronically Signed   By: Lucienne Capers M.D.   On: 04/05/2016 06:03    Procedures Procedures (including critical care time)  Medications Ordered in ED Medications  fentaNYL (SUBLIMAZE) injection 50 mcg (not administered)     Initial Impression / Assessment and Plan / ED Course  I have reviewed the triage vital signs and the nursing notes.  Pertinent labs & imaging results that were available during my care of the patient were reviewed by me and considered in my medical decision  making (see chart for details).  Clinical Course     Patient presents with back pain following a fall.  Otherwise assymptomatic.  Describes a mechanical fall.  No other notable injury.  Imaging with T12 compression fracture.  No signs of cord involvement.  Discussed with Dr. Saintclair Halsted, [place] for comfort and follow-up in one week.  Patient and family updated.  She remains comfortable.  Awaiting TLSO.    Final Clinical Impressions(s) / ED Diagnoses   Final diagnoses:  T12 compression fracture ([place])  Persistent atrial fibrillation ([facility])    New Prescriptions New Prescriptions   No medications on file     Merryl Hacker, MD 04/07/16 1622

## 2016-04-05 NOTE — H&P (Addendum)
[facility] History and Physical  Allison Sellers [id number] DOB: [birth year] DOA: 04/05/2016  Referring physician: Billy Fischer ED PCP: Ann Held, DO  Specialists: none  Chief Complaint:   HPI:  82 y/o ? Known history of chronic interstitial lung disease Interstitial lung disease sicne 2012 Chr persistent Afib on Xarelto for CHAD2 Vasc2=3 previously on Multaq, on [facility] with Xarelto  The patient has had multiple attempts at cardioversion 2  Next discussion was potential ablation surgery by Dr. Rayann Heman Diastolic HFGrade 2 diastolic dysfunction per echo-Last EF 45 percent 10/12/2015 CKD III She has had a prior GI bleed6/2016 With diverticulosis noted on colonoscopy  Comes to [facility] ed with fall accidentally on 11 8 2017. Awoke at 2 AM that she tripped over a curb and fell and hit her back Went back to bed and we awoke at around 3 AM and could not move and decided to come to emergency room  Workup in emergency room showed possible T11 fracture and T12 vertebral body height loss BUN 21, creatinine 1.19 baseline creatinine is 1.3 Sodium 133 WBC 11.5 Hemoglobin 15  She was set up to be discharged from the emergency room with a thoracolumbar brace but then was noted to have heart rate in the 160s had not beenGiven her home medications however was given the same and then given Cardizem push and then started on Cardizem gtt..   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 Chronic diastolic CHF (congestive heart failure) ([place])   . CKD (chronic kidney  disease), stage III   . Diverticular disease   . Hypertension   . Interstitial lung disease ([place])    a. Noted on CT scan 02/2011 before initiation of any antiarrhythmic meds.  . Osteopenia   . Persistent atrial fibrillation ([place])   . Seborrheic keratosis 06/2014   of hand.    Past Surgical History:  Procedure Laterality Date  . 2D Echocardiogram  03/29/2011   EF 55-65%, normal  . CARDIOVERSION  04/18/2011   Procedure: CARDIOVERSION;  Surgeon: Leonie Man;  Location: [facility];  Service: Cardiovascular;  Laterality: N/A;  . CARDIOVERSION N/A 11/20/2014   Procedure: CARDIOVERSION;  Surgeon: Jolaine Artist, MD;  Location: [facility];  Service: Cardiovascular;  Laterality: N/A;  . COLONOSCOPY N/A 11/03/2014   Procedure: COLONOSCOPY;  Surgeon: Ladene Artist, MD;  Location: [facility];  Service: Endoscopy;  Laterality: N/A;  . Lexiscan Myoview  04/14/2011   No scintigraphic evidence of inducible myocardial ischemia, EKG negative for ischemia, patient developed Atrial Fibrillation with rapid ventricular response during stress and persisted in the recovery period, abnormal myocardial perfusion study  . UMBILICAL HERNIA REPAIR  ? 2013   Social History:  Social History   Social History Narrative   She is a widowed mother of 13, grandmother of 13, great grandmother of 41. She is usually very active and likes to walk .    recently.    She quit smoking 50 years ago. She denies any alcohol.    She says in order to try to  make up for the fact she has not been doing much exercise she has been walking up and down the stairs at the house and staying as active as possible, just doing whatever activity she can do.     Allergies  Allergen Reactions  . Aspirin Other (See Comments)    Cannot have due to Xarelto and previous bleed  . Ciprofloxacin Other (See Comments) and Rash    Cant be given with her multaq    Family History  Problem Relation Age of Onset  . Heart failure Father     CHF  . Heart  failure Mother     CHF  . Macular degeneration Mother   . Arthritis      Prior to Admission medications   Medication Sig Start Date End Date Taking? Authorizing Provider  diltiazem (CARDIZEM CD) 120 MG 24 hr capsule Take 2 capsules by mouth in the am and 1 capsule by mouth in the pm 09/29/15  Yes Thompson Grayer, MD  lisinopril (PRINIVIL,ZESTRIL) 5 MG tablet Take 0.5 tablets (2.5 mg total) by mouth daily. 08/18/15  Yes Sherran Needs, NP  metoprolol succinate (TOPROL XL) 25 MG 24 hr tablet Take 2 tablets (50 mg total) in the a.m., then take 1 tablet (25 mg total) in the p.m. 12/29/15  Yes Sherran Needs, NP  nitroGLYCERIN (NITROSTAT) 0.4 MG SL tablet Place 1 tablet (0.4 mg total) under the tongue every 5 (five) minutes as needed. For chest pain 08/18/15  Yes Sherran Needs, NP  Rivaroxaban (XARELTO) 15 MG TABS tablet Take 1 tablet (15 mg total) by mouth daily. Patient taking differently: Take 15 mg by mouth every evening.  05/26/15  Yes Leonie Man, MD  Vitamin D, Ergocalciferol, (DRISDOL) 50000 UNITS CAPS capsule Take 50,000 Units by mouth every 7 (seven) days. Wednesday   Yes Historical Provider, MD  docusate sodium (COLACE) 100 MG capsule Take 1 capsule (100 mg total) by mouth every 12 (twelve) hours. 04/05/16   Merryl Hacker, MD  oxyCODONE-acetaminophen (PERCOCET/ROXICET) 5-325 MG tablet Take 1 tablet by mouth every 6 (six) hours as needed for severe pain. 04/05/16   Merryl Hacker, MD   Physical Exam: Vitals:   04/05/16 0755 04/05/16 1052 04/05/16 1130 04/05/16 1213  BP: 99/70 109/94 131/91 122/96  Pulse: 75 66 (!) 144 (!) 54  Resp: 18 18 18 22   Temp:      TempSrc:      SpO2: 94% 94% 91% 91%  Weight:      Height:        Alert pleasant oriented no apparent distress No icterus no pallor No JVD No bruit S1 and S2 tachycardic irregular rate rhythm rate of about 110 Chest is clinically clear but poor exam as patient cannot turn because of her fracture Able to dorsi and plantar  flex without issue however able to raise lower extremities about within normalPower In upper extremities   Labs on Admission:  Basic Metabolic Panel:  Recent Labs Lab 04/05/16 0640  NA 133*  K 4.4  CL 104  CO2 21*  GLUCOSE 119*  BUN 21*  CREATININE 1.19*  CALCIUM 8.8*   Liver Function Tests: No results for input(s): AST, ALT, ALKPHOS, BILITOT, PROT, ALBUMIN in the last 168 hours. No results for input(s): LIPASE, AMYLASE in the last 168 hours. No results for input(s): AMMONIA in the last 168 hours. CBC:  Recent Labs Lab 04/05/16 0640  WBC 11.5*  NEUTROABS 9.2*  HGB 15.5*  HCT  45.7  MCV 95.2  PLT 277   Cardiac Enzymes: No results for input(s): CKTOTAL, CKMB, CKMBINDEX, TROPONINI in the last 168 hours.  BNP (last 3 results) No results for input(s): BNP in the last 8760 hours.  ProBNP (last 3 results) No results for input(s): PROBNP in the last 8760 hours.  CBG: No results for input(s): GLUCAP in the last 168 hours.  Radiological Exams on Admission: Dg Chest 2 View  Result Date: 04/05/2016 CLINICAL DATA:  Fall this morning, cough, shortness of Breath EXAM: CHEST  2 VIEW COMPARISON:  11/20/2014 FINDINGS: Cardiomediastinal silhouette is stable. Again noted chronic interstitial prominence and peripheral fibrotic changes. There is patchy atelectasis or infiltrate in left lower lobe. Osteopenia and degenerative changes thoracic spine. IMPRESSION: Again noted chronic interstitial prominence and peripheral fibrotic changes. There is patchy atelectasis or infiltrate in left lower lobe. Electronically Signed   By: Lahoma Crocker M.D.   On: 04/05/2016 12:00   Dg Lumbar Spine Complete  Result Date: 04/05/2016 CLINICAL DATA:  [place] at home this morning. Moderate to severe mid low back pain worse with movement. EXAM: LUMBAR SPINE - COMPLETE 4+ VIEW COMPARISON:  06/16/2004 FINDINGS: Mild lumbar scoliosis convex towards the right. Diffuse degenerative change throughout the lumbar spine  with narrowed interspaces and endplate hypertrophic changes. No anterior subluxation of the vertebrae. Degenerative changes in the facet joints. There is mild compression of the superior endplates of 624THL and 624THL. This is occurred in the interval since the prior study and acute compression is not excluded. Changes likely to rest present osteoporosis. No retropulsion of fracture fragments. The bone cortex appears intact. No destructive bone lesions. Visualize sacrum appears intact. Aortic atherosclerosis. Pelvic calcifications consistent with uterine fibroids. IMPRESSION: Degenerative changes and scoliosis of the lumbar spine similar to previous study. Endplate compression deformities at T11 and T12 liver occurred since previous study and could be acute or chronic. Changes likely due to osteoporosis. Electronically Signed   By: Lucienne Capers M.D.   On: 04/05/2016 06:03   Ct Thoracic Spine Wo Contrast  Result Date: 04/05/2016 CLINICAL DATA:  Status post fall at 2 a.m. this morning with onset of back pain. EXAM: CT THORACIC AND LUMBAR SPINE WITHOUT CONTRAST TECHNIQUE: Multidetector CT imaging of the thoracic and lumbar spine was performed without contrast. Multiplanar CT image reconstructions were also generated. COMPARISON:  Plain films lumbar spine earlier today. PA and lateral chest 01/20/2015. CT chest 03/28/2011. FINDINGS: CT THORACIC SPINE FINDINGS Alignment: Maintained. Vertebrae: Bones appear osteopenic. There is a mild compression fracture deformity of T12. Vertebral body height loss is estimated at up to 15% eccentric to the left. The fracture appears acute with sharp fracture margins identified. No involvement of the posterior elements is seen. No other fracture is identified. Paraspinal and other soft tissues: There is cardiomegaly. Calcific aortic and coronary atherosclerosis is identified. Small bilateral pleural effusions are seen. There is coarsening of the pulmonary interstitium bilaterally and  extensive ground-glass attenuation. Disc levels: Unremarkable. CT LUMBAR SPINE FINDINGS Segmentation: Unremarkable. Alignment: Convex right scoliosis with the apex at L2-3 is noted. Vertebrae: Bones appear osteopenic.  No fracture is identified. Paraspinal and other soft tissues: Aortoiliac atherosclerosis without aneurysm is noted. There is partial visualization of a right renal cyst. Disc levels: Scattered mild disc bulging is most notable at L2-3 and L3-4. The central canal and foramina appear open. IMPRESSION: CT THORACIC SPINE IMPRESSION T12 compression fracture appears acute. Vertebral body height loss is mild at up to 15%. No extension into the posterior  elements or associated central canal compromise is identified. Possible T11 fracture described on report of plain films earlier today is not appreciated on this study. Osteopenia. Cardiomegaly. Calcific aortic and coronary atherosclerosis. Progressive pulmonary fibrosis since the patient's comparison chest CT. Ground-glass attenuation in the lungs could be due to active inflammation, atelectasis or possibly edema with small bilateral pleural effusions noted. CT LUMBAR SPINE IMPRESSION No acute abnormality in the lumbar spine. Convex right scoliosis. Degenerative disease without central canal stenosis. Electronically Signed   By: Inge Rise M.D.   On: 04/05/2016 08:03   Ct Lumbar Spine Wo Contrast  Result Date: 04/05/2016 CLINICAL DATA:  Status post fall at 2 a.m. this morning with onset of back pain. EXAM: CT THORACIC AND LUMBAR SPINE WITHOUT CONTRAST TECHNIQUE: Multidetector CT imaging of the thoracic and lumbar spine was performed without contrast. Multiplanar CT image reconstructions were also generated. COMPARISON:  Plain films lumbar spine earlier today. PA and lateral chest 01/20/2015. CT chest 03/28/2011. FINDINGS: CT THORACIC SPINE FINDINGS Alignment: Maintained. Vertebrae: Bones appear osteopenic. There is a mild compression fracture  deformity of T12. Vertebral body height loss is estimated at up to 15% eccentric to the left. The fracture appears acute with sharp fracture margins identified. No involvement of the posterior elements is seen. No other fracture is identified. Paraspinal and other soft tissues: There is cardiomegaly. Calcific aortic and coronary atherosclerosis is identified. Small bilateral pleural effusions are seen. There is coarsening of the pulmonary interstitium bilaterally and extensive ground-glass attenuation. Disc levels: Unremarkable. CT LUMBAR SPINE FINDINGS Segmentation: Unremarkable. Alignment: Convex right scoliosis with the apex at L2-3 is noted. Vertebrae: Bones appear osteopenic.  No fracture is identified. Paraspinal and other soft tissues: Aortoiliac atherosclerosis without aneurysm is noted. There is partial visualization of a right renal cyst. Disc levels: Scattered mild disc bulging is most notable at L2-3 and L3-4. The central canal and foramina appear open. IMPRESSION: CT THORACIC SPINE IMPRESSION T12 compression fracture appears acute. Vertebral body height loss is mild at up to 15%. No extension into the posterior elements or associated central canal compromise is identified. Possible T11 fracture described on report of plain films earlier today is not appreciated on this study. Osteopenia. Cardiomegaly. Calcific aortic and coronary atherosclerosis. Progressive pulmonary fibrosis since the patient's comparison chest CT. Ground-glass attenuation in the lungs could be due to active inflammation, atelectasis or possibly edema with small bilateral pleural effusions noted. CT LUMBAR SPINE IMPRESSION No acute abnormality in the lumbar spine. Convex right scoliosis. Degenerative disease without central canal stenosis. Electronically Signed   By: Inge Rise M.D.   On: 04/05/2016 08:03    EKG: Independently reviewed.  Afib, rate related changes in precordial and anterior leads No ST segment significant  changes   Assessment/Plan  Atrial fibrillation with RVR, [place] score 3 Keep on Cardizem gtt, may go up to 20 milligrams per minute, continue home medications Cardiology consultant Check magnesium Get TSH Continue metoprolol 50 mg twice a day Can continue Cardizem 120 twice a day Can wean off drip as blood pressure becomes better controlled Patient will need stepdown level of care until we can ascertain her heart rate is better controlled  T12/T11 fracture, placed in brace Will need pain management and out patient monitoring Start tramdol here for pain 50 q 6 prn  Hypertension-old less than 2.5 daily for now as her rate control  Prior GI bleed secondary to diverticulosis Now off ASA combo with Xarelto   Chronic kidney disease stage III Kidney function  Better than it has been in the past Okay for use of Xarelto  Osteopenia and vitamin D Continue vitamin B6 mentation units every 7 days as Outpatient   Impaired glucose tolerance As an outpatient would benefit from A1c  Long discussion with daughter at the bedside DO NOT RESUSCITATE Admitted as OBS to stepdown unit for monitoring    Verlon Au [facility] Pager [phone]  If 7PM-7AM, please contact night-coverage [email or web address] Password [email or web address] 04/05/2016, 1:03 PM

## 2016-04-05 NOTE — ED Notes (Signed)
Bed: WA25 Expected date:  Expected time:  Means of arrival:  Comments: fall 

## 2016-04-05 NOTE — ED Triage Notes (Signed)
Per EMS pt coming from home with c/o mechanical fall around 2 o'clock this morning. Per EMS pt was able to get herself up to bed, however when attempted to get up again, she couldn't due to back pain. Per EMS pt c/o bilateral lower back pain. Denies head injury, no LOC, is on blood thinners.

## 2016-04-05 NOTE — ED Notes (Addendum)
While attempting to d/c patient HR noted to be in 160s, irregular, O2 sats on RA 83-87%. MD notified

## 2016-04-05 NOTE — ED Notes (Signed)
Cardiologist at bedside.  

## 2016-04-05 NOTE — ED Notes (Signed)
[employer] at bedside to apply lumbar corset

## 2016-04-05 NOTE — ED Notes (Signed)
Pt denies any chest pain, palpations,, dizziness or pain at present time. Pt placed back on 2L [place].

## 2016-04-05 NOTE — Consult Note (Signed)
Cardiology Consult    Patient ID: Allison Sellers MRN: [id number], DOB/AGE: 80/11/1926   Admit date: 04/05/2016 Date of Consult: 04/05/2016  Primary Physician: Ann Held, DO Reason for Consult: Atrial Fibrillation Primary Cardiologist: Dr. Ellyn Hack Primary Electrophysiologist: Dr. Rayann Heman Requesting Provider: Dr. Billy Fischer   History of Present Illness    Allison Sellers is a 82 y.o. female with past medical history of persistent atrial fibrillation (on Xarelto), chronic diastolic CHF, HTN, and Stage 3 CKD who presents to [facility] ED on 04/05/2016 for evaluation of a mechanical fall.  Was last seen in the [facility] on 03/29/2016. She was previously on antiarrhythmic therapy with Multaq but had repeat episodes of PAF. With known interstitial lung disease and renal insufficiency, antiarrhythmic options have been limited and a rate-control strategy has been pursued. At the time of her office visit, HR was still elevated at 110, but patient reported her HR is usually in the lower 100's at home. She denied any palpitations at that time. Respiratory status was at baseline. She was continued on Toprol-XL 50mg  in AM and 25mg  in PM along with Cardizem CD 240mg  in AM and 120mg  in PM.   This morning at approximately 0200, the patient awoke to use the restroom. Unfortunately, she experienced a mechanical fall, tripping over a railing. She fell forward, but braced herself with her hands. Denies any loss of consciousness. No associated chest pain, palpitations, dyspnea, or dizziness. She went back to bed but awoke an hour later with significant back pain. She called her daughter who brought her to the emergency room.  While in the ED, initial labs show a WBC of 11.5, Hgb 15.5, platelets 277. Na+ 133. K+ 4.4. Creatinine 1.19. CXR with chronic interstitial prominence and peripheral fibrotic changes with patchy atelectasis or infiltrate in left lower lobe. CT Spine showed a T12 compression  fracture which appears acute with no extension or central canal compromise identified. Progressive pulmonary fibrosis with ground-glass attenuation in the lungs was noted which could be due to active inflammation, atelectasis or possibly edema. Small bilateral pleural effusions present as well.   She was going to be discharged with a back brace, but developed atrial fibrillation with RVR. EKG showed atrial fibrillation with RVR, HR 165.  She was given IV Cardizem bolus with minimal improvement in her symptoms. Eventually required initiation of a Cardizem drip and subsequent admission. Currently she is on 5 mg/hr with HR variable from 90 - 120. Reports her HR at home is usually in the 110's. She is usually unaware of the rhythm. Does have occasional anxiety with her elevated heart rate.  Past Medical History   Past Medical History:  Diagnosis Date  . Chronic diastolic CHF (congestive heart failure) ([place])   . CKD (chronic kidney disease), stage III   . Diverticular disease   . Hypertension   . Interstitial lung disease ([place])    a. Noted on CT scan 02/2011 before initiation of any antiarrhythmic meds.  . Osteopenia   . Persistent atrial fibrillation ([place])   . Seborrheic keratosis 06/2014   of hand.     Past Surgical History:  Procedure Laterality Date  . 2D Echocardiogram  03/29/2011   EF 55-65%, normal  . CARDIOVERSION  04/18/2011   Procedure: CARDIOVERSION;  Surgeon: Leonie Man;  Location: [place] [facility];  Service: Cardiovascular;  Laterality: N/A;  . CARDIOVERSION N/A 11/20/2014   Procedure: CARDIOVERSION;  Surgeon: Jolaine Artist, MD;  Location: [place];  Service: Cardiovascular;  Laterality: N/A;  . COLONOSCOPY N/A 11/03/2014   Procedure: COLONOSCOPY;  Surgeon: Ladene Artist, MD;  Location: [facility] ENDOSCOPY;  Service: Endoscopy;  Laterality: N/A;  . Lexiscan Myoview  04/14/2011   No scintigraphic evidence of inducible myocardial ischemia, EKG negative for ischemia, patient developed Atrial  Fibrillation with rapid ventricular response during stress and persisted in the recovery period, abnormal myocardial perfusion study  . UMBILICAL HERNIA REPAIR  ? 2013     Allergies  Allergies  Allergen Reactions  . Aspirin Other (See Comments)    Cannot have due to Xarelto and previous bleed  . Ciprofloxacin Other (See Comments) and Rash    Cant be given with her multaq    Inpatient Medications    . acetaminophen  650 mg Oral Once  . diltiazem  10 mg Intravenous Once    Family History    Family History  Problem Relation Age of Onset  . Heart failure Father     CHF  . Heart failure Mother     CHF  . Macular degeneration Mother   . Arthritis      Social History    Social History   Social History  . Marital status: Widowed    Spouse name: N/A  . Number of children: N/A  . Years of education: N/A   Occupational History  . Not on file.   Social History Main Topics  . Smoking status: Never Smoker  . Smokeless tobacco: Never Used  . Alcohol use No  . Drug use: No  . Sexual activity: Not Currently   Other Topics Concern  . Not on file   Social History Narrative   She is a widowed mother of 73, grandmother of 22, great grandmother of 1. She is usually very active and likes to walk .    recently.    She quit smoking 50 years ago. She denies any alcohol.    She says in order to try to make up for the fact she has not been doing much exercise she has been walking up and down the stairs at the house and staying as active as possible, just doing whatever activity she can do.      Review of Systems    General:  No chills, fever, night sweats or weight changes. Positive for recent fall. Cardiovascular:  No chest pain, dyspnea on exertion, edema, orthopnea, palpitations, paroxysmal nocturnal dyspnea. Dermatological: No rash, lesions/masses Respiratory: No cough, dyspnea Urologic: No hematuria, dysuria Abdominal:   No nausea, vomiting, diarrhea, bright red blood per  rectum, melena, or hematemesis Neurologic:  No visual changes, wkns, changes in mental status. MSK: Positive for mid-back pain.  All other systems reviewed and are otherwise negative except as noted above.  Physical Exam    Blood pressure 122/96, pulse (!) 54, temperature 97.8 F (36.6 C), temperature source Oral, resp. rate 22, height 5' (1.524 m), weight 130 lb (59 kg), SpO2 91 %.  General: Pleasant, elderly Caucasian female appearing in NAD Psych: Normal affect. Neuro: Alert and oriented X 3. Moves all extremities spontaneously. HEENT: Normal  Neck: Supple without bruits or JVD. Lungs:  Resp regular and unlabored, CTA without wheezing or rales. Heart: Irregularly, irregular, no s3, s4, or murmurs. Abdomen: Soft, non-tender, non-distended, BS + x 4.  Extremities: No clubbing, cyanosis or edema. DP/PT/Radials 2+ and equal bilaterally.  Labs    Troponin (Point of Care Test) No results for input(s): TROPIPOC in the last 72 hours. No results for input(s): CKTOTAL, CKMB,  TROPONINI in the last 72 hours. Lab Results  Component Value Date   WBC 11.5 (H) 04/05/2016   HGB 15.5 (H) 04/05/2016   HCT 45.7 04/05/2016   MCV 95.2 04/05/2016   PLT 277 04/05/2016    Recent Labs Lab 04/05/16 0640  NA 133*  K 4.4  CL 104  CO2 21*  BUN 21*  CREATININE 1.19*  CALCIUM 8.8*  GLUCOSE 119*   Lab Results  Component Value Date   CHOL 128 05/11/2015   HDL 56.10 05/11/2015   LDLCALC 52 05/11/2015   TRIG 97.0 05/11/2015   No results found for: [facility]   Radiology Studies    Dg Chest 2 View  Result Date: 04/05/2016 CLINICAL DATA:  Fall this morning, cough, shortness of Breath EXAM: CHEST  2 VIEW COMPARISON:  11/20/2014 FINDINGS: Cardiomediastinal silhouette is stable. Again noted chronic interstitial prominence and peripheral fibrotic changes. There is patchy atelectasis or infiltrate in left lower lobe. Osteopenia and degenerative changes thoracic spine. IMPRESSION: Again noted chronic  interstitial prominence and peripheral fibrotic changes. There is patchy atelectasis or infiltrate in left lower lobe. Electronically Signed   By: Lahoma Crocker M.D.   On: 04/05/2016 12:00   Dg Lumbar Spine Complete  Result Date: 04/05/2016 CLINICAL DATA:  [place] at home this morning. Moderate to severe mid low back pain worse with movement. EXAM: LUMBAR SPINE - COMPLETE 4+ VIEW COMPARISON:  06/16/2004 FINDINGS: Mild lumbar scoliosis convex towards the right. Diffuse degenerative change throughout the lumbar spine with narrowed interspaces and endplate hypertrophic changes. No anterior subluxation of the vertebrae. Degenerative changes in the facet joints. There is mild compression of the superior endplates of 624THL and 624THL. This is occurred in the interval since the prior study and acute compression is not excluded. Changes likely to rest present osteoporosis. No retropulsion of fracture fragments. The bone cortex appears intact. No destructive bone lesions. Visualize sacrum appears intact. Aortic atherosclerosis. Pelvic calcifications consistent with uterine fibroids. IMPRESSION: Degenerative changes and scoliosis of the lumbar spine similar to previous study. Endplate compression deformities at T11 and T12 liver occurred since previous study and could be acute or chronic. Changes likely due to osteoporosis. Electronically Signed   By: Lucienne Capers M.D.   On: 04/05/2016 06:03   Ct Thoracic Spine Wo Contrast  Result Date: 04/05/2016 CLINICAL DATA:  Status post fall at 2 a.m. this morning with onset of back pain. EXAM: CT THORACIC AND LUMBAR SPINE WITHOUT CONTRAST TECHNIQUE: Multidetector CT imaging of the thoracic and lumbar spine was performed without contrast. Multiplanar CT image reconstructions were also generated. COMPARISON:  Plain films lumbar spine earlier today. PA and lateral chest 01/20/2015. CT chest 03/28/2011. FINDINGS: CT THORACIC SPINE FINDINGS Alignment: Maintained. Vertebrae: Bones appear  osteopenic. There is a mild compression fracture deformity of T12. Vertebral body height loss is estimated at up to 15% eccentric to the left. The fracture appears acute with sharp fracture margins identified. No involvement of the posterior elements is seen. No other fracture is identified. Paraspinal and other soft tissues: There is cardiomegaly. Calcific aortic and coronary atherosclerosis is identified. Small bilateral pleural effusions are seen. There is coarsening of the pulmonary interstitium bilaterally and extensive ground-glass attenuation. Disc levels: Unremarkable. CT LUMBAR SPINE FINDINGS Segmentation: Unremarkable. Alignment: Convex right scoliosis with the apex at L2-3 is noted. Vertebrae: Bones appear osteopenic.  No fracture is identified. Paraspinal and other soft tissues: Aortoiliac atherosclerosis without aneurysm is noted. There is partial visualization of a right renal cyst. Disc levels: Scattered  mild disc bulging is most notable at L2-3 and L3-4. The central canal and foramina appear open. IMPRESSION: CT THORACIC SPINE IMPRESSION T12 compression fracture appears acute. Vertebral body height loss is mild at up to 15%. No extension into the posterior elements or associated central canal compromise is identified. Possible T11 fracture described on report of plain films earlier today is not appreciated on this study. Osteopenia. Cardiomegaly. Calcific aortic and coronary atherosclerosis. Progressive pulmonary fibrosis since the patient's comparison chest CT. Ground-glass attenuation in the lungs could be due to active inflammation, atelectasis or possibly edema with small bilateral pleural effusions noted. CT LUMBAR SPINE IMPRESSION No acute abnormality in the lumbar spine. Convex right scoliosis. Degenerative disease without central canal stenosis. Electronically Signed   By: Inge Rise M.D.   On: 04/05/2016 08:03   Ct Lumbar Spine Wo Contrast  Result Date: 04/05/2016 CLINICAL DATA:   Status post fall at 2 a.m. this morning with onset of back pain. EXAM: CT THORACIC AND LUMBAR SPINE WITHOUT CONTRAST TECHNIQUE: Multidetector CT imaging of the thoracic and lumbar spine was performed without contrast. Multiplanar CT image reconstructions were also generated. COMPARISON:  Plain films lumbar spine earlier today. PA and lateral chest 01/20/2015. CT chest 03/28/2011. FINDINGS: CT THORACIC SPINE FINDINGS Alignment: Maintained. Vertebrae: Bones appear osteopenic. There is a mild compression fracture deformity of T12. Vertebral body height loss is estimated at up to 15% eccentric to the left. The fracture appears acute with sharp fracture margins identified. No involvement of the posterior elements is seen. No other fracture is identified. Paraspinal and other soft tissues: There is cardiomegaly. Calcific aortic and coronary atherosclerosis is identified. Small bilateral pleural effusions are seen. There is coarsening of the pulmonary interstitium bilaterally and extensive ground-glass attenuation. Disc levels: Unremarkable. CT LUMBAR SPINE FINDINGS Segmentation: Unremarkable. Alignment: Convex right scoliosis with the apex at L2-3 is noted. Vertebrae: Bones appear osteopenic.  No fracture is identified. Paraspinal and other soft tissues: Aortoiliac atherosclerosis without aneurysm is noted. There is partial visualization of a right renal cyst. Disc levels: Scattered mild disc bulging is most notable at L2-3 and L3-4. The central canal and foramina appear open. IMPRESSION: CT THORACIC SPINE IMPRESSION T12 compression fracture appears acute. Vertebral body height loss is mild at up to 15%. No extension into the posterior elements or associated central canal compromise is identified. Possible T11 fracture described on report of plain films earlier today is not appreciated on this study. Osteopenia. Cardiomegaly. Calcific aortic and coronary atherosclerosis. Progressive pulmonary fibrosis since the patient's  comparison chest CT. Ground-glass attenuation in the lungs could be due to active inflammation, atelectasis or possibly edema with small bilateral pleural effusions noted. CT LUMBAR SPINE IMPRESSION No acute abnormality in the lumbar spine. Convex right scoliosis. Degenerative disease without central canal stenosis. Electronically Signed   By: Inge Rise M.D.   On: 04/05/2016 08:03    EKG & Cardiac Imaging    EKG: Atrial fibrillation with RVR, HR 165.  Echocardiogram: 10/12/2015 Study Conclusions  - Left ventricle: The cavity size was normal. Wall thickness was   normal. Systolic function was mildly reduced. The estimated   ejection fraction was in the range of 45% to 50%. Diffuse   hypokinesis. - Mitral valve: Calcified annulus. - Left atrium: The atrium was severely dilated. - Right atrium: The atrium was mildly dilated.  Impressions:  - Mild global reduction in LV function; biatrial enlargement; trace   MR and TR.  Assessment & Plan    1. Atrial Fibrillation  with RVR - has known persistent atrial fibrillation, having failed antiarrhythmic therapy in the past and currently on a rate-control strategy. On Toprol-XL 50mg  in AM and 25mg  in PM along with Cardizem CD 240mg  in AM and 120mg  in PM prior to admission. - presented with a mechanical fall and diagnosed with a T12 compression fracture with which she reports significant pain. Pain likely playing a role in her elevated HR.  - developed atrial fibrillation with RVR, HR in the 160's. Currently, she is on IV Cardizem 5 mg/hr with HR variable from 90 - 120 bpm. - continue IV Cardizem for now. Recommend continuing PTA BB dosing and will anticipate restarting PO Cardizem CD tomorrow.  - This patients CHA2DS2-VASc Score and unadjusted Ischemic Stroke Rate (% per year) is equal to 7.2 % stroke rate/year from a score of 5 (CHF, HTN, Female, Age (2)). Would continue anticoagulation with Xarelto at this time. If falls become more  frequent, would need to further address risks vs. Benefits with the patient and her family.  2. Chronic Diastolic CHF - Echo in [id number] showed EF of 45-50%, thought to be tachycardia induced.  - does not appear volume overloaded on exam. Continue BB dosing.   3. HTN - BP well-controlled. Monitor closely while on IV Cardizem.   4. Stage 3 CKD - creatinine stable at 1.19.  5. T12 Compression Fracture - per admitting team   Signed, Erma Heritage, PA-C 04/05/2016, 1:07 PM Pager: [phone]  The patient has been seen in conjunction with Bernerd Pho, PA-C. All aspects of care have been considered and discussed. The patient has been personally interviewed, examined, and all clinical data has been reviewed.   Known persistent atrial fibrillation. Current situation reviewed.  Heart rate increased likely due to missing a.m. doses of medications and pain.  Plan to reinstitute typical oral therapy and discontinue diltiazem drip in a.m.

## 2016-04-06 DIAGNOSIS — N183 Chronic kidney disease, stage 3 (moderate): Secondary | ICD-10-CM

## 2016-04-06 DIAGNOSIS — S22080A Wedge compression fracture of T11-T12 vertebra, initial encounter for closed fracture: Secondary | ICD-10-CM

## 2016-04-06 DIAGNOSIS — I5032 Chronic diastolic (congestive) heart failure: Secondary | ICD-10-CM

## 2016-04-06 DIAGNOSIS — S22009A Unspecified fracture of unspecified thoracic vertebra, initial encounter for closed fracture: Secondary | ICD-10-CM

## 2016-04-06 DIAGNOSIS — I4891 Unspecified atrial fibrillation: Secondary | ICD-10-CM

## 2016-04-06 DIAGNOSIS — S22000A Wedge compression fracture of unspecified thoracic vertebra, initial encounter for closed fracture: Secondary | ICD-10-CM

## 2016-04-06 DIAGNOSIS — I509 Heart failure, unspecified: Secondary | ICD-10-CM

## 2016-04-06 LAB — CBC WITH DIFFERENTIAL/PLATELET
BASOS ABS: 0 10*3/uL (ref 0.0–0.1)
Basophils Relative: 0 %
Eosinophils Absolute: 0.3 10*3/uL (ref 0.0–0.7)
Eosinophils Relative: 3 %
HCT: 44 % (ref 36.0–46.0)
Hemoglobin: 14.7 g/dL (ref 12.0–15.0)
LYMPHS PCT: 12 %
Lymphs Abs: 1.4 10*3/uL (ref 0.7–4.0)
MCH: 32.2 pg (ref 26.0–34.0)
MCHC: 33.4 g/dL (ref 30.0–36.0)
MCV: 96.3 fL (ref 78.0–100.0)
MONO ABS: 1.3 10*3/uL — AB (ref 0.1–1.0)
Monocytes Relative: 11 %
NEUTROS ABS: 9.3 10*3/uL — AB (ref 1.7–7.7)
Neutrophils Relative %: 74 %
Platelets: 274 10*3/uL (ref 150–400)
RBC: 4.57 MIL/uL (ref 3.87–5.11)
RDW: 14.4 % (ref 11.5–15.5)
WBC: 12.4 10*3/uL — AB (ref 4.0–10.5)

## 2016-04-06 LAB — COMPREHENSIVE METABOLIC PANEL
ALT: 12 U/L — AB (ref 14–54)
AST: 22 U/L (ref 15–41)
Albumin: 3.7 g/dL (ref 3.5–5.0)
Alkaline Phosphatase: 68 U/L (ref 38–126)
Anion gap: 8 (ref 5–15)
BILIRUBIN TOTAL: 1.7 mg/dL — AB (ref 0.3–1.2)
BUN: 28 mg/dL — AB (ref 6–20)
CO2: 22 mmol/L (ref 22–32)
CREATININE: 1.16 mg/dL — AB (ref 0.44–1.00)
Calcium: 9 mg/dL (ref 8.9–10.3)
Chloride: 102 mmol/L (ref 101–111)
GFR calc Af Amer: 48 mL/min — ABNORMAL LOW (ref >60)
GFR, EST NON AFRICAN AMERICAN: 41 mL/min — AB (ref >60)
Glucose, Bld: 130 mg/dL — ABNORMAL HIGH (ref 65–99)
Potassium: 4.5 mmol/L (ref 3.5–5.1)
Sodium: 132 mmol/L — ABNORMAL LOW (ref 135–145)
TOTAL PROTEIN: 7 g/dL (ref 6.5–8.1)

## 2016-04-06 LAB — PHOSPHORUS: Phosphorus: 3.8 mg/dL (ref 2.5–4.6)

## 2016-04-06 LAB — MAGNESIUM: MAGNESIUM: 2 mg/dL (ref 1.7–2.4)

## 2016-04-06 MED ORDER — DILTIAZEM HCL ER COATED BEADS 240 MG PO CP24
240.0000 mg | ORAL_CAPSULE | Freq: Every day | ORAL | Status: DC
  Administered 2016-04-06: 240 mg via ORAL
  Filled 2016-04-06: qty 1

## 2016-04-06 MED ORDER — DILTIAZEM HCL ER COATED BEADS 120 MG PO CP24: 120.0000 mg | ORAL_CAPSULE | Freq: Every day | ORAL | Status: DC

## 2016-04-06 MED ORDER — DILTIAZEM HCL ER COATED BEADS 240 MG PO CP24
240.0000 mg | ORAL_CAPSULE | Freq: Every day | ORAL | Status: DC
  Administered 2016-04-06 – 2016-04-07 (×2): 240 mg via ORAL
  Filled 2016-04-06 (×2): qty 1

## 2016-04-06 MED ORDER — DILTIAZEM HCL-DEXTROSE 100-5 MG/100ML-% IV SOLN (PREMIX)
5.0000 mg/h | INTRAVENOUS | Status: DC
  Administered 2016-04-06: 5 mg/h via INTRAVENOUS
  Filled 2016-04-06: qty 100

## 2016-04-06 MED ORDER — DILTIAZEM HCL 25 MG/5ML IV SOLN
5.0000 mg | Freq: Once | INTRAVENOUS | Status: AC
  Administered 2016-04-06: 5 mg via INTRAVENOUS
  Filled 2016-04-06 (×2): qty 5

## 2016-04-06 NOTE — Care Management Note (Signed)
Case Management Note  Patient Details  Name: Allison Sellers MRN: [id number] Date of Birth: 05-25-1927  Subjective/Objective: PT-recc HHPT. [facility] chosen by patient-rep Manuela Schwartz aware & following. Await HHPT,f15f orders. Patient has rw,3n1. Patient will stay w/dtr:[name] [address] Letta Kocher TD:[phone].  On 02-may need home 02.                   Action/Plan:d/c home w/HHC.   Expected Discharge Date:   (unknown)               Expected Discharge Plan:  [place]  In-House Referral:     Discharge planning Services  CM Consult  Post Acute Care Choice:    Choice offered to:  Patient  DME Arranged:    DME Agency:     HH Arranged:    [place] Agency:     Status of Service:  In process, will continue to follow  If discussed at Long Length of Stay Meetings, dates discussed:    Additional Comments:  Dessa Phi, RN 04/06/2016, 2:10 PM

## 2016-04-06 NOTE — Progress Notes (Signed)
Notified [name] that pt had a 2 second pause.

## 2016-04-06 NOTE — Progress Notes (Signed)
PROGRESS NOTE    Allison Sellers  [id number] DOB: 10-Jun-1926 DOA: 04/05/2016 PCP: Ann Held, DO   Brief Narrative: Patient is an 82 year old Caucasian female with a PMH of Chronic Lung Disease, Persistent Atrial Fibrillation on Anticoagulation with Xarelto, Diastolic Grade 2 Heart Failure, CKD Stage 3, GI Bleed with Diverticulosis who presented to [facility] with a fall after tripping over a curb. She was found to have a T12 Vertebral Body Fracture and was going to be D/C'd home but ended up going into Atrial Fibrillation with RVR. She was started on a Cardizem gtt and admitted and Cardiology was consulted.   Assessment & Plan:   Principal Problem:   Atrial fibrillation with rapid ventricular response (HCC) Active Problems:   Chronic diastolic CHF (congestive heart failure) (HCC)   Thoracic compression fracture (HCC)  Atrial Fibrillation with RVR -CHADSVASC of 5 -Cardiology was consulted and appreciated Recc's; Cardizem gtt stopped -C/w Metoprolol XL 50 mg po Daily and 25 mg po Daily -C/w Cardizem 240 mg Daily and at Bedtime -C/w Rivaroxaban 15 mg daily -Cardiology recommends Atrial Fibrillation Clinic for Further Medication Titration  T12 Compression Fracture -Pain Control with Acetaminophen and Tylenol -Will Discuss Case with Neurosurgery -PT to Evaluate and Treat  CKD Stage 3 -BUN/Cr was 28/1.16 -Continue to Monitor and Avoid Nephrotoxic Medications  Hypertension -Well controlled. Continue Toprol XL  Chronic Diastolic Heart Failure -Currently not in Exacerbation   DVT prophylaxis: Rivaroxaban Code Status: DNR Family Communication: Discussed case with patient's Daughter at bedside Disposition Plan: Home Health PT  Consultants:  Cardiology  Procedures: None  Antimicrobials: None  Subjective: Seen and examined at bedside and pain was better. HR was fluctuating. No N/V/Lightheadedness or dizziness. No CP or SOB. Patient has significant back pain  still. No other complaints.   Objective: Vitals:   04/06/16 0151 04/06/16 0442 04/06/16 0814 04/06/16 0943  BP: 120/69 116/74 (!) 107/92 112/72  Pulse: 94 84 (!) 155 88  Resp:  16    Temp:  98 F (36.7 C)    TempSrc:  Oral    SpO2:  93%    Weight:      Height:        Intake/Output Summary (Last 24 hours) at 04/06/16 1215 Last data filed at 04/06/16 0900  Gross per 24 hour  Intake              600 ml  Output                0 ml  Net              600 ml   Filed Weights   04/05/16 0455 04/05/16 2039  Weight: 59 kg (130 lb) 61.1 kg (134 lb 11.2 oz)    Examination: Physical Exam:  Constitutional: WN/WD, NAD Eyes: Lids and conjunctivae normal, sclerae anicteric  ENMT: External Ears, Nose appear normal. Grossly normal hearing.  Neck: Appears normal, supple, no cervical masses, normal ROM, no appreciable thyromegaly Respiratory: Clear to auscultation bilaterally, no wheezing, rales, rhonchi or crackles. Normal respiratory effort and patient is not tachypenic. No accessory muscle use.  Cardiovascular: Irregularly Irregular, no murmurs / rubs / gallops. S1 and S2 auscultated.  Abdomen: Soft, non-tender, non-distended. No masses palpated. No appreciable hepatosplenomegaly. Bowel sounds positive.  GU: Deferred. Musculoskeletal: No clubbing / cyanosis of digits/nails. No joint deformity upper and lower extremities. Decreased strength because of pain.  Skin: No rashes, lesions, ulcers. No induration on limited skin examination; Warm  and dry.  Neurologic: CN 2-12 grossly intact with no focal deficits. Sensation intact in all 4 Extremities. Romberg sign cerebellar reflexes not assessed.  Psychiatric: Normal judgment and insight. Alert and oriented x 3. Normal mood and appropriate affect.   Data Reviewed: I have personally reviewed following labs and imaging studies  CBC:  Recent Labs Lab 04/05/16 0640 04/06/16 0803  WBC 11.5* 12.4*  NEUTROABS 9.2* 9.3*  HGB 15.5* 14.7  HCT 45.7  44.0  MCV 95.2 96.3  PLT [id number]   Basic Metabolic Panel:  Recent Labs Lab 04/05/16 0640 04/05/16 1635 04/06/16 0803  NA 133*  --  132*  K 4.4  --  4.5  CL 104  --  102  CO2 21*  --  22  GLUCOSE 119*  --  130*  BUN 21*  --  28*  CREATININE 1.19*  --  1.16*  CALCIUM 8.8*  --  9.0  MG  --  2.1 2.0  PHOS  --   --  3.8   GFR: Estimated Creatinine Clearance: 28.4 mL/min (by C-G formula based on SCr of 1.16 mg/dL (H)). Liver Function Tests:  Recent Labs Lab 04/06/16 0803  AST 22  ALT 12*  ALKPHOS 68  BILITOT 1.7*  PROT 7.0  ALBUMIN 3.7   No results for input(s): LIPASE, AMYLASE in the last 168 hours. No results for input(s): AMMONIA in the last 168 hours. Coagulation Profile: No results for input(s): INR, PROTIME in the last 168 hours. Cardiac Enzymes: No results for input(s): CKTOTAL, CKMB, CKMBINDEX, TROPONINI in the last 168 hours. BNP (last 3 results) No results for input(s): PROBNP in the last 8760 hours. HbA1C: No results for input(s): HGBA1C in the last 72 hours. CBG: No results for input(s): GLUCAP in the last 168 hours. Lipid Profile: No results for input(s): CHOL, HDL, LDLCALC, TRIG, CHOLHDL, LDLDIRECT in the last 72 hours. Thyroid Function Tests:  Recent Labs  04/05/16 1635  TSH 1.930   Anemia Panel: No results for input(s): VITAMINB12, FOLATE, FERRITIN, TIBC, IRON, RETICCTPCT in the last 72 hours. Sepsis Labs: No results for input(s): PROCALCITON, LATICACIDVEN in the last 168 hours.  No results found for this or any previous visit (from the past 240 hour(s)).   Radiology Studies: Dg Chest 2 View  Result Date: 04/05/2016 CLINICAL DATA:  Fall this morning, cough, shortness of Breath EXAM: CHEST  2 VIEW COMPARISON:  11/20/2014 FINDINGS: Cardiomediastinal silhouette is stable. Again noted chronic interstitial prominence and peripheral fibrotic changes. There is patchy atelectasis or infiltrate in left lower lobe. Osteopenia and degenerative changes  thoracic spine. IMPRESSION: Again noted chronic interstitial prominence and peripheral fibrotic changes. There is patchy atelectasis or infiltrate in left lower lobe. Electronically Signed   By: Lahoma Crocker M.D.   On: 04/05/2016 12:00   Dg Lumbar Spine Complete  Result Date: 04/05/2016 CLINICAL DATA:  [place] at home this morning. Moderate to severe mid low back pain worse with movement. EXAM: LUMBAR SPINE - COMPLETE 4+ VIEW COMPARISON:  06/16/2004 FINDINGS: Mild lumbar scoliosis convex towards the right. Diffuse degenerative change throughout the lumbar spine with narrowed interspaces and endplate hypertrophic changes. No anterior subluxation of the vertebrae. Degenerative changes in the facet joints. There is mild compression of the superior endplates of 624THL and 624THL. This is occurred in the interval since the prior study and acute compression is not excluded. Changes likely to rest present osteoporosis. No retropulsion of fracture fragments. The bone cortex appears intact. No destructive bone lesions. Visualize sacrum  appears intact. Aortic atherosclerosis. Pelvic calcifications consistent with uterine fibroids. IMPRESSION: Degenerative changes and scoliosis of the lumbar spine similar to previous study. Endplate compression deformities at T11 and T12 liver occurred since previous study and could be acute or chronic. Changes likely due to osteoporosis. Electronically Signed   By: Lucienne Capers M.D.   On: 04/05/2016 06:03   Ct Thoracic Spine Wo Contrast  Result Date: 04/05/2016 CLINICAL DATA:  Status post fall at 2 a.m. this morning with onset of back pain. EXAM: CT THORACIC AND LUMBAR SPINE WITHOUT CONTRAST TECHNIQUE: Multidetector CT imaging of the thoracic and lumbar spine was performed without contrast. Multiplanar CT image reconstructions were also generated. COMPARISON:  Plain films lumbar spine earlier today. PA and lateral chest 01/20/2015. CT chest 03/28/2011. FINDINGS: CT THORACIC SPINE FINDINGS  Alignment: Maintained. Vertebrae: Bones appear osteopenic. There is a mild compression fracture deformity of T12. Vertebral body height loss is estimated at up to 15% eccentric to the left. The fracture appears acute with sharp fracture margins identified. No involvement of the posterior elements is seen. No other fracture is identified. Paraspinal and other soft tissues: There is cardiomegaly. Calcific aortic and coronary atherosclerosis is identified. Small bilateral pleural effusions are seen. There is coarsening of the pulmonary interstitium bilaterally and extensive ground-glass attenuation. Disc levels: Unremarkable. CT LUMBAR SPINE FINDINGS Segmentation: Unremarkable. Alignment: Convex right scoliosis with the apex at L2-3 is noted. Vertebrae: Bones appear osteopenic.  No fracture is identified. Paraspinal and other soft tissues: Aortoiliac atherosclerosis without aneurysm is noted. There is partial visualization of a right renal cyst. Disc levels: Scattered mild disc bulging is most notable at L2-3 and L3-4. The central canal and foramina appear open. IMPRESSION: CT THORACIC SPINE IMPRESSION T12 compression fracture appears acute. Vertebral body height loss is mild at up to 15%. No extension into the posterior elements or associated central canal compromise is identified. Possible T11 fracture described on report of plain films earlier today is not appreciated on this study. Osteopenia. Cardiomegaly. Calcific aortic and coronary atherosclerosis. Progressive pulmonary fibrosis since the patient's comparison chest CT. Ground-glass attenuation in the lungs could be due to active inflammation, atelectasis or possibly edema with small bilateral pleural effusions noted. CT LUMBAR SPINE IMPRESSION No acute abnormality in the lumbar spine. Convex right scoliosis. Degenerative disease without central canal stenosis. Electronically Signed   By: Inge Rise M.D.   On: 04/05/2016 08:03   Ct Lumbar Spine Wo  Contrast  Result Date: 04/05/2016 CLINICAL DATA:  Status post fall at 2 a.m. this morning with onset of back pain. EXAM: CT THORACIC AND LUMBAR SPINE WITHOUT CONTRAST TECHNIQUE: Multidetector CT imaging of the thoracic and lumbar spine was performed without contrast. Multiplanar CT image reconstructions were also generated. COMPARISON:  Plain films lumbar spine earlier today. PA and lateral chest 01/20/2015. CT chest 03/28/2011. FINDINGS: CT THORACIC SPINE FINDINGS Alignment: Maintained. Vertebrae: Bones appear osteopenic. There is a mild compression fracture deformity of T12. Vertebral body height loss is estimated at up to 15% eccentric to the left. The fracture appears acute with sharp fracture margins identified. No involvement of the posterior elements is seen. No other fracture is identified. Paraspinal and other soft tissues: There is cardiomegaly. Calcific aortic and coronary atherosclerosis is identified. Small bilateral pleural effusions are seen. There is coarsening of the pulmonary interstitium bilaterally and extensive ground-glass attenuation. Disc levels: Unremarkable. CT LUMBAR SPINE FINDINGS Segmentation: Unremarkable. Alignment: Convex right scoliosis with the apex at L2-3 is noted. Vertebrae: Bones appear osteopenic.  No  fracture is identified. Paraspinal and other soft tissues: Aortoiliac atherosclerosis without aneurysm is noted. There is partial visualization of a right renal cyst. Disc levels: Scattered mild disc bulging is most notable at L2-3 and L3-4. The central canal and foramina appear open. IMPRESSION: CT THORACIC SPINE IMPRESSION T12 compression fracture appears acute. Vertebral body height loss is mild at up to 15%. No extension into the posterior elements or associated central canal compromise is identified. Possible T11 fracture described on report of plain films earlier today is not appreciated on this study. Osteopenia. Cardiomegaly. Calcific aortic and coronary atherosclerosis.  Progressive pulmonary fibrosis since the patient's comparison chest CT. Ground-glass attenuation in the lungs could be due to active inflammation, atelectasis or possibly edema with small bilateral pleural effusions noted. CT LUMBAR SPINE IMPRESSION No acute abnormality in the lumbar spine. Convex right scoliosis. Degenerative disease without central canal stenosis. Electronically Signed   By: Inge Rise M.D.   On: 04/05/2016 08:03   Scheduled Meds: . acetaminophen  650 mg Oral Once  . diltiazem  120 mg Oral QHS  . diltiazem  240 mg Oral Daily  . metoprolol succinate  50 mg Oral Daily   And  . metoprolol succinate  25 mg Oral QHS  . Rivaroxaban  15 mg Oral Q supper  . traMADol  50 mg Oral Q12H   Continuous Infusions:   LOS: 1 day   Kerney Elbe, DO [facility] Pager [phone]  If 7PM-7AM, please contact night-coverage [email or web address] Password [email or web address] 04/06/2016, 12:15 PM

## 2016-04-06 NOTE — Progress Notes (Signed)
Patient Name: Allison Sellers Date of Encounter: 04/06/2016  Primary Cardiologist: Dr. Kearney Hard Problem List     Principal Problem:   Atrial fibrillation with rapid ventricular response [facility]) Active Problems:   Chronic diastolic CHF (congestive heart failure) (HCC)   Thoracic compression fracture (HCC)    Subjective   Reports continued pain along her back, but was able to sleep through the night. Denies any chest discomfort or palpitations.   Inpatient Medications    Scheduled Meds: . acetaminophen  650 mg Oral Once  . diltiazem  120 mg Oral QHS  . diltiazem  240 mg Oral Daily  . metoprolol succinate  50 mg Oral Daily   And  . metoprolol succinate  25 mg Oral QHS  . Rivaroxaban  15 mg Oral Q supper  . traMADol  50 mg Oral Q12H   Continuous Infusions:  PRN Meds: acetaminophen, HYDROcodone-acetaminophen, Influenza vac split quadrivalent PF, ondansetron (ZOFRAN) IV   Vital Signs    Vitals:   04/06/16 0151 04/06/16 0442 04/06/16 0814 04/06/16 0943  BP: 120/69 116/74 (!) 107/92 112/72  Pulse: 94 84 (!) 155 88  Resp:  16    Temp:  98 F (36.7 C)    TempSrc:  Oral    SpO2:  93%    Weight:      Height:        Intake/Output Summary (Last 24 hours) at 04/06/16 1135 Last data filed at 04/06/16 0900  Gross per 24 hour  Intake              600 ml  Output                0 ml  Net              600 ml   Filed Weights   04/05/16 0455 04/05/16 2039  Weight: 130 lb (59 kg) 134 lb 11.2 oz (61.1 kg)    Physical Exam   GEN: Well nourished, well developed, Caucasian female appearing in no acute distress.  HEENT: Grossly normal.  Neck: Supple, no JVD, carotid bruits, or masses. Cardiac: Irregularly irregular, no murmurs, rubs, or gallops. No clubbing, cyanosis, edema.  Radials/DP/PT 2+ and equal bilaterally.  Respiratory:  Respirations regular and unlabored, clear to auscultation bilaterally. GI: Soft, nontender, nondistended, BS + x 4. MS: no deformity or  atrophy. Skin: warm and dry, no rash. Neuro:  Strength and sensation are intact. Psych: AAOx3.  Normal affect.  Labs    CBC  Recent Labs  04/05/16 0640 04/06/16 0803  WBC 11.5* 12.4*  NEUTROABS 9.2* 9.3*  HGB 15.5* 14.7  HCT 45.7 44.0  MCV 95.2 96.3  PLT [id number]   Basic Metabolic Panel  Recent Labs  04/05/16 0640 04/05/16 1635 04/06/16 0803  NA 133*  --  132*  K 4.4  --  4.5  CL 104  --  102  CO2 21*  --  22  GLUCOSE 119*  --  130*  BUN 21*  --  28*  CREATININE 1.19*  --  1.16*  CALCIUM 8.8*  --  9.0  MG  --  2.1 2.0  PHOS  --   --  3.8   Liver Function Tests  Recent Labs  04/06/16 0803  AST 22  ALT 12*  ALKPHOS 68  BILITOT 1.7*  PROT 7.0  ALBUMIN 3.7   No results for input(s): LIPASE, AMYLASE in the last 72 hours. Cardiac Enzymes No results for input(s): CKTOTAL, CKMB,  CKMBINDEX, TROPONINI in the last 72 hours. BNP Invalid input(s): POCBNP D-Dimer No results for input(s): DDIMER in the last 72 hours. Hemoglobin A1C No results for input(s): HGBA1C in the last 72 hours. Fasting Lipid Panel No results for input(s): CHOL, HDL, LDLCALC, TRIG, CHOLHDL, LDLDIRECT in the last 72 hours. Thyroid Function Tests  Recent Labs  04/05/16 1635  TSH 1.930    Telemetry    Atrial fibrillation, HR in 90's - 130's. Mostly in low-100's. 2 second pause this AM - Personally Reviewed  ECG    No new tracings.  Radiology    Dg Chest 2 View  Result Date: 04/05/2016 CLINICAL DATA:  Fall this morning, cough, shortness of Breath EXAM: CHEST  2 VIEW COMPARISON:  11/20/2014 FINDINGS: Cardiomediastinal silhouette is stable. Again noted chronic interstitial prominence and peripheral fibrotic changes. There is patchy atelectasis or infiltrate in left lower lobe. Osteopenia and degenerative changes thoracic spine. IMPRESSION: Again noted chronic interstitial prominence and peripheral fibrotic changes. There is patchy atelectasis or infiltrate in left lower lobe.  Electronically Signed   By: Lahoma Crocker M.D.   On: 04/05/2016 12:00   Dg Lumbar Spine Complete  Result Date: 04/05/2016 CLINICAL DATA:  [place] at home this morning. Moderate to severe mid low back pain worse with movement. EXAM: LUMBAR SPINE - COMPLETE 4+ VIEW COMPARISON:  06/16/2004 FINDINGS: Mild lumbar scoliosis convex towards the right. Diffuse degenerative change throughout the lumbar spine with narrowed interspaces and endplate hypertrophic changes. No anterior subluxation of the vertebrae. Degenerative changes in the facet joints. There is mild compression of the superior endplates of 624THL and 624THL. This is occurred in the interval since the prior study and acute compression is not excluded. Changes likely to rest present osteoporosis. No retropulsion of fracture fragments. The bone cortex appears intact. No destructive bone lesions. Visualize sacrum appears intact. Aortic atherosclerosis. Pelvic calcifications consistent with uterine fibroids. IMPRESSION: Degenerative changes and scoliosis of the lumbar spine similar to previous study. Endplate compression deformities at T11 and T12 liver occurred since previous study and could be acute or chronic. Changes likely due to osteoporosis. Electronically Signed   By: Lucienne Capers M.D.   On: 04/05/2016 06:03   Ct Thoracic Spine Wo Contrast  Result Date: 04/05/2016 CLINICAL DATA:  Status post fall at 2 a.m. this morning with onset of back pain. EXAM: CT THORACIC AND LUMBAR SPINE WITHOUT CONTRAST TECHNIQUE: Multidetector CT imaging of the thoracic and lumbar spine was performed without contrast. Multiplanar CT image reconstructions were also generated. COMPARISON:  Plain films lumbar spine earlier today. PA and lateral chest 01/20/2015. CT chest 03/28/2011. FINDINGS: CT THORACIC SPINE FINDINGS Alignment: Maintained. Vertebrae: Bones appear osteopenic. There is a mild compression fracture deformity of T12. Vertebral body height loss is estimated at up to 15%  eccentric to the left. The fracture appears acute with sharp fracture margins identified. No involvement of the posterior elements is seen. No other fracture is identified. Paraspinal and other soft tissues: There is cardiomegaly. Calcific aortic and coronary atherosclerosis is identified. Small bilateral pleural effusions are seen. There is coarsening of the pulmonary interstitium bilaterally and extensive ground-glass attenuation. Disc levels: Unremarkable. CT LUMBAR SPINE FINDINGS Segmentation: Unremarkable. Alignment: Convex right scoliosis with the apex at L2-3 is noted. Vertebrae: Bones appear osteopenic.  No fracture is identified. Paraspinal and other soft tissues: Aortoiliac atherosclerosis without aneurysm is noted. There is partial visualization of a right renal cyst. Disc levels: Scattered mild disc bulging is most notable at L2-3 and L3-4. The  central canal and foramina appear open. IMPRESSION: CT THORACIC SPINE IMPRESSION T12 compression fracture appears acute. Vertebral body height loss is mild at up to 15%. No extension into the posterior elements or associated central canal compromise is identified. Possible T11 fracture described on report of plain films earlier today is not appreciated on this study. Osteopenia. Cardiomegaly. Calcific aortic and coronary atherosclerosis. Progressive pulmonary fibrosis since the patient's comparison chest CT. Ground-glass attenuation in the lungs could be due to active inflammation, atelectasis or possibly edema with small bilateral pleural effusions noted. CT LUMBAR SPINE IMPRESSION No acute abnormality in the lumbar spine. Convex right scoliosis. Degenerative disease without central canal stenosis. Electronically Signed   By: Inge Rise M.D.   On: 04/05/2016 08:03   Ct Lumbar Spine Wo Contrast  Result Date: 04/05/2016 CLINICAL DATA:  Status post fall at 2 a.m. this morning with onset of back pain. EXAM: CT THORACIC AND LUMBAR SPINE WITHOUT CONTRAST  TECHNIQUE: Multidetector CT imaging of the thoracic and lumbar spine was performed without contrast. Multiplanar CT image reconstructions were also generated. COMPARISON:  Plain films lumbar spine earlier today. PA and lateral chest 01/20/2015. CT chest 03/28/2011. FINDINGS: CT THORACIC SPINE FINDINGS Alignment: Maintained. Vertebrae: Bones appear osteopenic. There is a mild compression fracture deformity of T12. Vertebral body height loss is estimated at up to 15% eccentric to the left. The fracture appears acute with sharp fracture margins identified. No involvement of the posterior elements is seen. No other fracture is identified. Paraspinal and other soft tissues: There is cardiomegaly. Calcific aortic and coronary atherosclerosis is identified. Small bilateral pleural effusions are seen. There is coarsening of the pulmonary interstitium bilaterally and extensive ground-glass attenuation. Disc levels: Unremarkable. CT LUMBAR SPINE FINDINGS Segmentation: Unremarkable. Alignment: Convex right scoliosis with the apex at L2-3 is noted. Vertebrae: Bones appear osteopenic.  No fracture is identified. Paraspinal and other soft tissues: Aortoiliac atherosclerosis without aneurysm is noted. There is partial visualization of a right renal cyst. Disc levels: Scattered mild disc bulging is most notable at L2-3 and L3-4. The central canal and foramina appear open. IMPRESSION: CT THORACIC SPINE IMPRESSION T12 compression fracture appears acute. Vertebral body height loss is mild at up to 15%. No extension into the posterior elements or associated central canal compromise is identified. Possible T11 fracture described on report of plain films earlier today is not appreciated on this study. Osteopenia. Cardiomegaly. Calcific aortic and coronary atherosclerosis. Progressive pulmonary fibrosis since the patient's comparison chest CT. Ground-glass attenuation in the lungs could be due to active inflammation, atelectasis or  possibly edema with small bilateral pleural effusions noted. CT LUMBAR SPINE IMPRESSION No acute abnormality in the lumbar spine. Convex right scoliosis. Degenerative disease without central canal stenosis. Electronically Signed   By: Inge Rise M.D.   On: 04/05/2016 08:03    Cardiac Studies   Echocardiogram: 10/12/2015 Study Conclusions  - Left ventricle: The cavity size was normal. Wall thickness was normal. Systolic function was mildly reduced. The estimated ejection fraction was in the range of 45% to 50%. Diffuse hypokinesis. - Mitral valve: Calcified annulus. - Left atrium: The atrium was severely dilated. - Right atrium: The atrium was mildly dilated.  Impressions:  - Mild global reduction in LV function; biatrial enlargement; trace MR and TR.  Patient Profile     82 yo female w/ PMH of persistent atrial fibrillation (on Xarelto), chronic diastolic CHF, HTN, and Stage 3 CKD who presented to [facility] ED on 04/05/2016 for evaluation of a mechanical fall. Diagnosed  with T12 compression fx. Cards consulted for atrial fibrillation with RVR.   Assessment & Plan    1. Atrial Fibrillation with RVR - has known persistent atrial fibrillation, having failed antiarrhythmic therapy in the past and currently on a rate-control strategy. On Toprol-XL 50mg  in AM and 25mg  in PM along with Cardizem CD 240mg  in AM and 120mg  in PM prior to admission. - presented with a mechanical fall and diagnosed with a T12 compression fracture with which she reports significant pain. Pain likely playing a role in her elevated HR.  - HR improved to 90's - 120's overnight. Will switch from IV Cardizem to PTA rate-controlling medications of Toprol-XL and Cardizem CD. Of note, the patient's HR is usually in the low-100's to 110's. This would be an acceptable range for her HR this admission. Had one pause of 2 seconds on telemetry. Can allow up to 3 second pauses with atrial fibrillation if she is  asymptomatic with this. - This patients CHA2DS2-VASc Score and unadjusted Ischemic Stroke Rate (% per year) is equal to 7.2 % stroke rate/year from a score of 5 (CHF, HTN, Female, Age (2)). Continue with Xarelto. If falls become recurrent, will need to address risks vs. benefits. To ambulate with PT later today.   2. Chronic Diastolic CHF - Echo in [id number] showed EF of 45-50%, thought to be tachycardia induced.  - does not appear volume overloaded on exam. Continue BB dosing.   3. HTN - BP well-controlled at 102/61 - 131/96 in the past 24 hours.  4. Stage 3 CKD - creatinine stable at 1.16.  5. T12 Compression Fracture - per admitting team  Signed, Erma Heritage, PA  04/06/2016, 11:35 AM  The patient has been seen in conjunction with Bernerd Pho, [place]. All aspects of care have been considered and discussed. The patient has been personally interviewed, examined, and all clinical data has been reviewed.   I recommend that we increase her evening dose of diltiazem to 240 mg per day.  She should continue to follow up in the atrial fibrillation clinic for further medication titration and or consideration of AV node ablation with permanent pacemaker implantation.  She is eligible for discharge from our viewpoint.

## 2016-04-06 NOTE — Consult Note (Signed)
   [facility] CM Inpatient Consult   04/06/2016  Allison Sellers 1927-04-11 [id number]    Patient screened for [facility] Management services. Went to bedside to offer and explain [facility] Care Management program with patient. [name] states " I do not think I need it". Her daughter at bedside states they will discuss and call [facility] if [name] changes her mind. Accepted [facility] Care Management brochure with contact information.  Made inpatient RNCM aware that patient declined [facility] Management program services.  Marthenia Rolling, MSN-Ed, RN,BSN [facility] Liaison [phone]

## 2016-04-06 NOTE — Evaluation (Signed)
Physical Therapy Evaluation Patient Details Name: ONIESHA LASKO MRN: [id number] DOB: 12/25/1926 Today's Date: 04/06/2016   History of Present Illness  82 yo female admitted after sustaining a fall at home. Imaging showed compression fractures, acute T12 and possible T11. Prior to d/c, pt went into A fib with RVR and d/c held.  hx of Afib, pulm fiborsis, HF, CKD, osteopenia.   Clinical Impression  On eval, pt required Min assist +2 for safety/equipment. She walked ~115 with a RW. Pain rated 5/10 with activity. Daughter present and assisted as well. Total assist to don TLSO. Discussed d/c plan-daughter states pt will likely d/c to her home until she gets better. HR up to 150s and O2 sats 83% on RA during ambulation. Will continue to follow and progress activity as tolerated.     Follow Up Recommendations Home health PT;Supervision/Assistance - 24 hour    Equipment Recommendations   (pt/family state they have walkers available at home)    Recommendations for Other Services OT consult     Precautions / Restrictions Precautions Precautions: Fall Required Braces or Orthoses: Spinal Brace Spinal Brace: Thoracolumbosacral orthotic;Applied in sitting position Restrictions Weight Bearing Restrictions: No      Mobility  Bed Mobility Overal bed mobility: Needs Assistance Bed Mobility: Supine to Sit     Supine to sit: Min assist;HOB elevated     General bed mobility comments: Assist for trunk. Increased time.   Transfers Overall transfer level: Needs assistance Equipment used: Rolling walker (2 wheeled) Transfers: Sit to/from Stand Sit to Stand: Min assist;+2 safety/equipment         General transfer comment: assist to rise, stabilize, control descent. VCs safety, hand placement  Ambulation/Gait Ambulation/Gait assistance: Min assist;+2 safety/equipment Ambulation Distance (Feet): 115 Feet Assistive device: Rolling walker (2 wheeled) Gait Pattern/deviations: Step-through  pattern;Decreased stride length     General Gait Details: assist to stabilize intermittently. HR as high as 150 bpm, O2 sats 83% on RA  Stairs            Wheelchair Mobility    Modified Rankin (Stroke Patients Only)       Balance Overall balance assessment: History of Falls;Needs assistance           Standing balance-Leahy Scale: Poor                               Pertinent Vitals/Pain Pain Assessment: 0-10 Pain Score: 5  Pain Location: back-mostly with transitional movements Pain Descriptors / Indicators: Sharp;Sore;Grimacing Pain Intervention(s): Limited activity within patient's tolerance;Repositioned    Home Living Family/patient expects to be discharged to:: Private residence Living Arrangements: Children (pt will likely d/c to daughters home) Available Help at Discharge: Family Type of Home: House         Home Equipment: Gilford Rile - 2 wheels      Prior Function Level of Independence: Independent with assistive device(s)         Comments: used cane PRN in community     Hand Dominance        Extremity/Trunk Assessment   Upper Extremity Assessment: Overall WFL for tasks assessed           Lower Extremity Assessment: Generalized weakness      Cervical / Trunk Assessment: Normal  Communication   Communication: No difficulties  Cognition Arousal/Alertness: Awake/alert Behavior During Therapy: WFL for tasks assessed/performed Overall Cognitive Status: Within Functional Limits for tasks assessed  General Comments      Exercises     Assessment/Plan    PT Assessment Patient needs continued PT services  PT Problem List Decreased strength;Decreased mobility;Decreased activity tolerance;Decreased balance;Decreased knowledge of use of DME;Pain          PT Treatment Interventions DME instruction;Therapeutic activities;Therapeutic exercise;Gait training;Functional mobility training;Balance  training;Patient/family education    PT Goals (Current goals can be found in the Care Plan section)  Acute Rehab PT Goals Patient Stated Goal: less pain. regain PLOF PT Goal Formulation: With patient/family Time For Goal Achievement: 04/20/16 Potential to Achieve Goals: Good    Frequency Min 3X/week   Barriers to discharge        Co-evaluation               End of Session Equipment Utilized During Treatment: Gait belt;Back brace Activity Tolerance: Patient tolerated treatment well Patient left: in chair;with family/visitor present;with call bell/phone within reach           Time: 1132-1158 PT Time Calculation (min) (ACUTE ONLY): 26 min   Charges:   PT Evaluation $PT Eval Low Complexity: 1 Procedure PT Treatments $Gait Training: 8-22 mins   PT G Codes:        Weston Anna, MPT Pager: [phone]

## 2016-04-07 DIAGNOSIS — E871 Hypo-osmolality and hyponatremia: Secondary | ICD-10-CM

## 2016-04-07 DIAGNOSIS — D72829 Elevated white blood cell count, unspecified: Secondary | ICD-10-CM

## 2016-04-07 LAB — COMPREHENSIVE METABOLIC PANEL
ALBUMIN: 3.6 g/dL (ref 3.5–5.0)
ALT: 13 U/L — ABNORMAL LOW (ref 14–54)
ANION GAP: 8 (ref 5–15)
AST: 19 U/L (ref 15–41)
Alkaline Phosphatase: 64 U/L (ref 38–126)
BILIRUBIN TOTAL: 1.8 mg/dL — AB (ref 0.3–1.2)
BUN: 23 mg/dL — ABNORMAL HIGH (ref 6–20)
CO2: 23 mmol/L (ref 22–32)
Calcium: 9.2 mg/dL (ref 8.9–10.3)
Chloride: 99 mmol/L — ABNORMAL LOW (ref 101–111)
Creatinine, Ser: 0.95 mg/dL (ref 0.44–1.00)
GFR calc Af Amer: >60 mL/min (ref >60)
GFR, EST NON AFRICAN AMERICAN: 53 mL/min — AB (ref >60)
Glucose, Bld: 118 mg/dL — ABNORMAL HIGH (ref 65–99)
POTASSIUM: 5 mmol/L (ref 3.5–5.1)
Sodium: 130 mmol/L — ABNORMAL LOW (ref 135–145)
TOTAL PROTEIN: 6.9 g/dL (ref 6.5–8.1)

## 2016-04-07 LAB — CBC WITH DIFFERENTIAL/PLATELET
BASOS PCT: 0 %
Basophils Absolute: 0 10*3/uL (ref 0.0–0.1)
EOS PCT: 1 %
Eosinophils Absolute: 0.1 10*3/uL (ref 0.0–0.7)
HEMATOCRIT: 40.4 % (ref 36.0–46.0)
Hemoglobin: 13.8 g/dL (ref 12.0–15.0)
Lymphocytes Relative: 12 %
Lymphs Abs: 1.7 10*3/uL (ref 0.7–4.0)
MCH: 32.3 pg (ref 26.0–34.0)
MCHC: 34.2 g/dL (ref 30.0–36.0)
MCV: 94.6 fL (ref 78.0–100.0)
MONO ABS: 1.5 10*3/uL — AB (ref 0.1–1.0)
MONOS PCT: 11 %
NEUTROS ABS: 10.5 10*3/uL — AB (ref 1.7–7.7)
Neutrophils Relative %: 76 %
PLATELETS: 269 10*3/uL (ref 150–400)
RBC: 4.27 MIL/uL (ref 3.87–5.11)
RDW: 14.2 % (ref 11.5–15.5)
WBC: 13.8 10*3/uL — ABNORMAL HIGH (ref 4.0–10.5)

## 2016-04-07 LAB — PHOSPHORUS: Phosphorus: 3.5 mg/dL (ref 2.5–4.6)

## 2016-04-07 LAB — MAGNESIUM: MAGNESIUM: 2 mg/dL (ref 1.7–2.4)

## 2016-04-07 MED ORDER — METOPROLOL SUCCINATE ER 25 MG PO TB24: 75.0000 mg | ORAL_TABLET | Freq: Every day | ORAL | 0 refills | Status: DC

## 2016-04-07 MED ORDER — DILTIAZEM HCL ER COATED BEADS 240 MG PO CP24: 240.0000 mg | ORAL_CAPSULE | Freq: Every day | ORAL | 0 refills | Status: AC

## 2016-04-07 MED ORDER — METOPROLOL SUCCINATE ER 50 MG PO TB24: 50.0000 mg | ORAL_TABLET | Freq: Every day | ORAL | Status: DC

## 2016-04-07 MED ORDER — OXYCODONE-ACETAMINOPHEN 5-325 MG PO TABS: 1.0000 | ORAL_TABLET | Freq: Four times a day (QID) | ORAL | 0 refills | Status: AC | PRN

## 2016-04-07 MED ORDER — DOCUSATE SODIUM 100 MG PO CAPS: 100.0000 mg | ORAL_CAPSULE | Freq: Two times a day (BID) | ORAL | 0 refills | Status: AC | PRN

## 2016-04-07 MED ORDER — METOPROLOL SUCCINATE ER 50 MG PO TB24: 50.0000 mg | ORAL_TABLET | Freq: Every day | ORAL | 0 refills | Status: AC

## 2016-04-07 MED ORDER — METOPROLOL SUCCINATE ER 50 MG PO TB24
75.0000 mg | ORAL_TABLET | Freq: Every day | ORAL | Status: DC
  Administered 2016-04-07: 75 mg via ORAL
  Filled 2016-04-07: qty 1

## 2016-04-07 MED ORDER — DILTIAZEM HCL ER COATED BEADS 240 MG PO CP24: 240.0000 mg | ORAL_CAPSULE | Freq: Every day | ORAL | 0 refills | Status: DC

## 2016-04-07 MED ORDER — TRAMADOL HCL 50 MG PO TABS: 50.0000 mg | ORAL_TABLET | Freq: Two times a day (BID) | ORAL | 0 refills | Status: AC | PRN

## 2016-04-07 NOTE — Progress Notes (Addendum)
Patient Name: Allison Sellers Date of Encounter: 04/07/2016  Primary Cardiologist: Dr. Kearney Hard Problem List     Principal Problem:   Atrial fibrillation with rapid ventricular response [facility]) Active Problems:   Chronic diastolic CHF (congestive heart failure) (HCC)   T12 compression fracture (HCC)    Subjective   HR elevated into the 150's overnight with no improvement following bolus injections. Was started on Cardizem drip, HR in the 110's currently. Denies any chest pain or palpitations. Still having episodes of severe back pain.   Inpatient Medications    Scheduled Meds: . acetaminophen  650 mg Oral Once  . diltiazem  240 mg Oral Daily  . diltiazem  240 mg Oral QHS  . metoprolol succinate  75 mg Oral Daily   And  . metoprolol succinate  50 mg Oral QHS  . Rivaroxaban  15 mg Oral Q supper  . traMADol  50 mg Oral Q12H   Continuous Infusions:  PRN Meds: acetaminophen, HYDROcodone-acetaminophen, Influenza vac split quadrivalent PF, ondansetron (ZOFRAN) IV   Vital Signs    Vitals:   04/07/16 0320 04/07/16 0343 04/07/16 0500 04/07/16 0606  BP: 104/76 110/75 121/81 127/79  Pulse: 74 80    Resp:  (!) 24    Temp:      TempSrc:      SpO2:  94%    Weight:      Height:        Intake/Output Summary (Last 24 hours) at 04/07/16 0840 Last data filed at 04/07/16 0600  Gross per 24 hour  Intake           572.83 ml  Output                0 ml  Net           572.83 ml   Filed Weights   04/05/16 0455 04/05/16 2039  Weight: 130 lb (59 kg) 134 lb 11.2 oz (61.1 kg)    Physical Exam   GEN: Well nourished, well developed, Caucasian female appearing in no acute distress.  HEENT: Grossly normal.  Neck: Supple, no JVD, carotid bruits, or masses. Cardiac: Irregularly irregular, no murmurs, rubs, or gallops. No clubbing, cyanosis, edema.  Radials/DP/PT 2+ and equal bilaterally.  Respiratory:  Respirations regular and unlabored, clear to auscultation bilaterally. GI:  Soft, nontender, nondistended, BS + x 4. MS: no deformity or atrophy. Back brace in place.  Skin: warm and dry, no rash. Neuro:  Strength and sensation are intact. Psych: AAOx3.  Normal affect.  Labs    CBC  Recent Labs  04/06/16 0803 04/07/16 0403  WBC 12.4* 13.8*  NEUTROABS 9.3* 10.5*  HGB 14.7 13.8  HCT 44.0 40.4  MCV 96.3 94.6  PLT 274 [id number]   Basic Metabolic Panel  Recent Labs  04/06/16 0803 04/07/16 0503  NA 132* 130*  K 4.5 5.0  CL 102 99*  CO2 22 23  GLUCOSE 130* 118*  BUN 28* 23*  CREATININE 1.16* 0.95  CALCIUM 9.0 9.2  MG 2.0 2.0  PHOS 3.8 3.5   Liver Function Tests  Recent Labs  04/06/16 0803 04/07/16 0503  AST 22 19  ALT 12* 13*  ALKPHOS 68 64  BILITOT 1.7* 1.8*  PROT 7.0 6.9  ALBUMIN 3.7 3.6   No results for input(s): LIPASE, AMYLASE in the last 72 hours. Cardiac Enzymes No results for input(s): CKTOTAL, CKMB, CKMBINDEX, TROPONINI in the last 72 hours. BNP Invalid input(s): POCBNP D-Dimer No results for input(s):  DDIMER in the last 72 hours. Hemoglobin A1C No results for input(s): HGBA1C in the last 72 hours. Fasting Lipid Panel No results for input(s): CHOL, HDL, LDLCALC, TRIG, CHOLHDL, LDLDIRECT in the last 72 hours. Thyroid Function Tests  Recent Labs  04/05/16 1635  TSH 1.930    Telemetry    Atrial fibrillation, HR in 90's - 150's. Mostly in low-100's. 2 second pause this AM - Personally Reviewed  ECG    No new tracings.  Radiology    Dg Chest 2 View  Result Date: 04/05/2016 CLINICAL DATA:  Fall this morning, cough, shortness of Breath EXAM: CHEST  2 VIEW COMPARISON:  11/20/2014 FINDINGS: Cardiomediastinal silhouette is stable. Again noted chronic interstitial prominence and peripheral fibrotic changes. There is patchy atelectasis or infiltrate in left lower lobe. Osteopenia and degenerative changes thoracic spine. IMPRESSION: Again noted chronic interstitial prominence and peripheral fibrotic changes. There is patchy  atelectasis or infiltrate in left lower lobe. Electronically Signed   By: Lahoma Crocker M.D.   On: 04/05/2016 12:00   Dg Lumbar Spine Complete  Result Date: 04/05/2016 CLINICAL DATA:  [place] at home this morning. Moderate to severe mid low back pain worse with movement. EXAM: LUMBAR SPINE - COMPLETE 4+ VIEW COMPARISON:  06/16/2004 FINDINGS: Mild lumbar scoliosis convex towards the right. Diffuse degenerative change throughout the lumbar spine with narrowed interspaces and endplate hypertrophic changes. No anterior subluxation of the vertebrae. Degenerative changes in the facet joints. There is mild compression of the superior endplates of 624THL and 624THL. This is occurred in the interval since the prior study and acute compression is not excluded. Changes likely to rest present osteoporosis. No retropulsion of fracture fragments. The bone cortex appears intact. No destructive bone lesions. Visualize sacrum appears intact. Aortic atherosclerosis. Pelvic calcifications consistent with uterine fibroids. IMPRESSION: Degenerative changes and scoliosis of the lumbar spine similar to previous study. Endplate compression deformities at T11 and T12 liver occurred since previous study and could be acute or chronic. Changes likely due to osteoporosis. Electronically Signed   By: Lucienne Capers M.D.   On: 04/05/2016 06:03   Ct Thoracic Spine Wo Contrast  Result Date: 04/05/2016 CLINICAL DATA:  Status post fall at 2 a.m. this morning with onset of back pain. EXAM: CT THORACIC AND LUMBAR SPINE WITHOUT CONTRAST TECHNIQUE: Multidetector CT imaging of the thoracic and lumbar spine was performed without contrast. Multiplanar CT image reconstructions were also generated. COMPARISON:  Plain films lumbar spine earlier today. PA and lateral chest 01/20/2015. CT chest 03/28/2011. FINDINGS: CT THORACIC SPINE FINDINGS Alignment: Maintained. Vertebrae: Bones appear osteopenic. There is a mild compression fracture deformity of T12. Vertebral  body height loss is estimated at up to 15% eccentric to the left. The fracture appears acute with sharp fracture margins identified. No involvement of the posterior elements is seen. No other fracture is identified. Paraspinal and other soft tissues: There is cardiomegaly. Calcific aortic and coronary atherosclerosis is identified. Small bilateral pleural effusions are seen. There is coarsening of the pulmonary interstitium bilaterally and extensive ground-glass attenuation. Disc levels: Unremarkable. CT LUMBAR SPINE FINDINGS Segmentation: Unremarkable. Alignment: Convex right scoliosis with the apex at L2-3 is noted. Vertebrae: Bones appear osteopenic.  No fracture is identified. Paraspinal and other soft tissues: Aortoiliac atherosclerosis without aneurysm is noted. There is partial visualization of a right renal cyst. Disc levels: Scattered mild disc bulging is most notable at L2-3 and L3-4. The central canal and foramina appear open. IMPRESSION: CT THORACIC SPINE IMPRESSION T12 compression fracture appears acute.  Vertebral body height loss is mild at up to 15%. No extension into the posterior elements or associated central canal compromise is identified. Possible T11 fracture described on report of plain films earlier today is not appreciated on this study. Osteopenia. Cardiomegaly. Calcific aortic and coronary atherosclerosis. Progressive pulmonary fibrosis since the patient's comparison chest CT. Ground-glass attenuation in the lungs could be due to active inflammation, atelectasis or possibly edema with small bilateral pleural effusions noted. CT LUMBAR SPINE IMPRESSION No acute abnormality in the lumbar spine. Convex right scoliosis. Degenerative disease without central canal stenosis. Electronically Signed   By: Inge Rise M.D.   On: 04/05/2016 08:03   Ct Lumbar Spine Wo Contrast  Result Date: 04/05/2016 CLINICAL DATA:  Status post fall at 2 a.m. this morning with onset of back pain. EXAM: CT  THORACIC AND LUMBAR SPINE WITHOUT CONTRAST TECHNIQUE: Multidetector CT imaging of the thoracic and lumbar spine was performed without contrast. Multiplanar CT image reconstructions were also generated. COMPARISON:  Plain films lumbar spine earlier today. PA and lateral chest 01/20/2015. CT chest 03/28/2011. FINDINGS: CT THORACIC SPINE FINDINGS Alignment: Maintained. Vertebrae: Bones appear osteopenic. There is a mild compression fracture deformity of T12. Vertebral body height loss is estimated at up to 15% eccentric to the left. The fracture appears acute with sharp fracture margins identified. No involvement of the posterior elements is seen. No other fracture is identified. Paraspinal and other soft tissues: There is cardiomegaly. Calcific aortic and coronary atherosclerosis is identified. Small bilateral pleural effusions are seen. There is coarsening of the pulmonary interstitium bilaterally and extensive ground-glass attenuation. Disc levels: Unremarkable. CT LUMBAR SPINE FINDINGS Segmentation: Unremarkable. Alignment: Convex right scoliosis with the apex at L2-3 is noted. Vertebrae: Bones appear osteopenic.  No fracture is identified. Paraspinal and other soft tissues: Aortoiliac atherosclerosis without aneurysm is noted. There is partial visualization of a right renal cyst. Disc levels: Scattered mild disc bulging is most notable at L2-3 and L3-4. The central canal and foramina appear open. IMPRESSION: CT THORACIC SPINE IMPRESSION T12 compression fracture appears acute. Vertebral body height loss is mild at up to 15%. No extension into the posterior elements or associated central canal compromise is identified. Possible T11 fracture described on report of plain films earlier today is not appreciated on this study. Osteopenia. Cardiomegaly. Calcific aortic and coronary atherosclerosis. Progressive pulmonary fibrosis since the patient's comparison chest CT. Ground-glass attenuation in the lungs could be due to  active inflammation, atelectasis or possibly edema with small bilateral pleural effusions noted. CT LUMBAR SPINE IMPRESSION No acute abnormality in the lumbar spine. Convex right scoliosis. Degenerative disease without central canal stenosis. Electronically Signed   By: Inge Rise M.D.   On: 04/05/2016 08:03    Cardiac Studies   Echocardiogram: 10/12/2015 Study Conclusions  - Left ventricle: The cavity size was normal. Wall thickness was normal. Systolic function was mildly reduced. The estimated ejection fraction was in the range of 45% to 50%. Diffuse hypokinesis. - Mitral valve: Calcified annulus. - Left atrium: The atrium was severely dilated. - Right atrium: The atrium was mildly dilated.  Impressions:  - Mild global reduction in LV function; biatrial enlargement; trace MR and TR.  Patient Profile     82 yo female w/ PMH of persistent atrial fibrillation (on Xarelto), chronic diastolic CHF, HTN, and Stage 3 CKD who presented to [facility] ED on 04/05/2016 for evaluation of a mechanical fall. Diagnosed with T12 compression fx. Cards consulted for atrial fibrillation with RVR.   Assessment & Plan  1. Atrial Fibrillation with RVR - has known persistent atrial fibrillation, having failed antiarrhythmic therapy in the past and currently on a rate-control strategy. On Toprol-XL 50mg  in AM and 25mg  in PM along with Cardizem CD 240mg  in AM and 120mg  in PM prior to admission. - presented with a mechanical fall and diagnosed with a T12 compression fracture with which she reports significant pain. Pain likely playing a role in her elevated HR.  - HR became elevated into the 150's overnight, requiring reinitiation of IV Cardizem. Will discontinue drip now and further titrate PO medication regimen. Cardizem CD has been increased to 240mg  BID. Will increase Toprol-XL dosing to 75mg  in AM and 50mg  in PM. Of note, the patient's baseline HR prior to admission is in the low-100's - 110's.  Had one pause of 2 seconds on telemetry on 11/9. Can allow up to 3 second pauses with atrial fibrillation if she is asymptomatic with this. - This patients CHA2DS2-VASc Score and unadjusted Ischemic Stroke Rate (% per year) is equal to 7.2 % stroke rate/year from a score of 5 (CHF, HTN, Female, Age (2)). Continue with Xarelto. If falls become recurrent, will need to address risks vs. benefits. To ambulate with PT later today.   2. Chronic Diastolic CHF - Echo in [id number] showed EF of 45-50%, thought to be tachycardia induced.  - does not appear volume overloaded on exam. Continue BB dosing.   3. HTN - BP well-controlled at 104/64 - 129/98  in the past 24 hours.  4. Stage 3 CKD - creatinine stable at 0.95.  5. T12 Compression Fracture - per admitting team  Will need to be seen in the Atrial Fibrillation in 1 week for close follow-up.   Signed, Erma Heritage, PA  04/07/2016, 8:40 AM   The patient has been seen in conjunction with [place], PA-C. All aspects of care have been considered and discussed. The patient has been personally interviewed, examined, and all clinical data has been reviewed.   I would not further titrate beta blocker and calcium channel blocker doses. Currently on Toprol-XL (75 a.m. and 50 p.m.) and diltiazem CD (240 mg twice a day).  Since we have further up titrated therapy, an additional 12-24 hours of monitoring would make the most sense, however she prefers to be discharged. I would not resume IV diltiazem even if the heart rates are high. A. fib is not the reason for admission to the hospital.   She needs home O2.  Follow-up in A. fib clinic.

## 2016-04-07 NOTE — Discharge Summary (Signed)
Physician Discharge Summary  Allison Sellers [id number] DOB: 1926-12-19 DOA: 04/05/2016  PCP: Ann Held, DO  Admit date: 04/05/2016 Discharge date: 04/07/2016  Admitted From: Home Disposition: [place] with PT  Recommendations for Outpatient Follow-up:  1. Follow up with PCP in 1-2 weeks 2. Follow up with Neurosurgery Dr. Saintclair Halsted in 1 week 3. Follow up with Atrial Fibrillation Clinic 4. Follow up with Cardiology 5. Please obtain BMP/CBC within week  Home Health: YES  Equipment/Devices: TSO Brace; Home O2 2 Liters via [place]  Discharge Condition: Stable CODE STATUS: FULL Diet recommendation: Heart Healthy  Brief/Interim Summary: Patient is an 82 year old Caucasian female with a PMH of Chronic Lung Disease, Persistent Atrial Fibrillation on Anticoagulation with Xarelto, Diastolic Grade 2 Heart Failure, CKD Stage 3, GI Bleed with Diverticulosis who presented to [facility] with a fall after tripping over a curb. She was found to have a T12 Vertebral Body Fracture and was going to be D/C'd home but ended up going into Atrial Fibrillation with RVR. She was started on a Cardizem gtt and admitted and Cardiology was consulted. Patient was transitioned off Cardizem gtt and had po Medications increased. Neurosurgery Dr. Ronnald Ramp was called and he stated that he felt like no intervention could be done as Compression Fx was small and recommended TSO brace and follow up as outpatient. Patient was ambulated with a walk screen and required O2 so she will be D/C'd Home with continuous Home O2. Patient is to follow up with PCP, Cardiology and Atrial Fibrillation Clinic, as well as Dr. Saintclair Halsted in Neurosurgery as an outpatient. She will have home PT come evaluate her. At this time patient is stable for D/C home.   Discharge Diagnoses:  Principal Problem:   Atrial fibrillation with rapid ventricular response (HCC) Active Problems:   Chronic diastolic CHF (congestive heart failure) (HCC)   T12  compression fracture (HCC)  Atrial Fibrillation with RVR -CHADSVASC of 5 -Cardiology was consulted and appreciated Recc's; Cardizem gtt stopped -C/w Metoprolol XL 75 mg po Daily and 50 mg po Daily -C/w Cardizem 240 mg Daily and at Bedtime -C/w Rivaroxaban 15 mg daily -Patient becomes Hypoxic because of Atrial Fibrillation with RVR and will D/C Home with O2 -Cardiology recommends Atrial Fibrillation Clinic for Further Medication Titration -Follow up with Cardiology  T12 Compression Fracture -Pain Control with Acetaminophen  -Discussed case with Neurosurgery Dr. Ronnald Ramp and he felt Fx was too small to Kyphoplasty -PT to Evaluated and Patient will require Home PT  CKD Stage 3 -BUN/Cr was 23/0.95 -Continue to Monitor and Avoid Nephrotoxic Medications -Follow up at PCP's office  Hypertension -Well controlled. Continue Metoprolol -Follow up at PCP's office  Chronic Diastolic Heart Failure -Currently not in Exacerbation -Follow up with Cardiology in Outpatient setting  Leukocytosis -Patient's WBC slightly increased from 12.4 -> 13.8 -Likely Reactive to Pain -No evidence or S/Sx of Infection -Have Repeat CBC as an outpatient at PCP office  Mild Hyponatremia -Dropped from 132 -> 130 -Repeat BMP at PCP's office.   Discharge Instructions  Discharge Instructions    Call MD for:  persistant dizziness or light-headedness    Complete by:  As directed    Call MD for:  persistant nausea and vomiting    Complete by:  As directed    Call MD for:  severe uncontrolled pain    Complete by:  As directed    Diet - low sodium heart healthy    Complete by:  As directed  Discharge instructions    Complete by:  As directed    Follow up with PCP and with Atrial Fibrillation Clinic. Ensure to wear Brace when out of bed. Take all Medications as prescribed. If symptoms change or worsen pleas return to the ER for evaluation.   For home use only DME oxygen    Complete by:  As directed     Liters per Minute:  2   Frequency:  Continuous (stationary and portable oxygen unit needed)   Oxygen delivery system:  Gas   Increase activity slowly    Complete by:  As directed        Medication List    TAKE these medications   diltiazem 240 MG 24 hr capsule Commonly known as:  CARDIZEM CD Take 1 capsule (240 mg total) by mouth at bedtime. What changed:  You were already taking a medication with the same name, and this prescription was added. Make sure you understand how and when to take each.   diltiazem 240 MG 24 hr capsule Commonly known as:  CARDIZEM CD Take 1 capsule (240 mg total) by mouth daily. Start taking on:  04/08/2016 What changed:  medication strength  how much to take  how to take this  when to take this  additional instructions   docusate sodium 100 MG capsule Commonly known as:  COLACE Take 1 capsule (100 mg total) by mouth 2 (two) times daily as needed for mild constipation.   lisinopril 5 MG tablet Commonly known as:  PRINIVIL,ZESTRIL Take 0.5 tablets (2.5 mg total) by mouth daily.   metoprolol succinate 50 MG 24 hr tablet Commonly known as:  TOPROL-XL Take 1 tablet (50 mg total) by mouth at bedtime. Take with or immediately following a meal. What changed:  medication strength  how much to take  how to take this  when to take this  additional instructions   metoprolol succinate 25 MG 24 hr tablet Commonly known as:  TOPROL-XL Take 3 tablets (75 mg total) by mouth daily. Start taking on:  04/08/2016 What changed:  You were already taking a medication with the same name, and this prescription was added. Make sure you understand how and when to take each.   nitroGLYCERIN 0.4 MG SL tablet Commonly known as:  NITROSTAT Place 1 tablet (0.4 mg total) under the tongue every 5 (five) minutes as needed. For chest pain   oxyCODONE-acetaminophen 5-325 MG tablet Commonly known as:  PERCOCET/ROXICET Take 1 tablet by mouth every 6 (six) hours  as needed for severe pain.   Rivaroxaban 15 MG Tabs tablet Commonly known as:  XARELTO Take 1 tablet (15 mg total) by mouth daily. What changed:  when to take this   traMADol 50 MG tablet Commonly known as:  ULTRAM Take 1 tablet (50 mg total) by mouth every 12 (twelve) hours as needed for severe pain.   Vitamin D (Ergocalciferol) 50000 units Caps capsule Commonly known as:  DRISDOL Take 50,000 Units by mouth every 7 (seven) days. Wednesday            Durable Medical Equipment        Start     Ordered   04/07/16 1059  For home use only DME oxygen  Once    Question Answer Comment  Mode or (Route) Nasal cannula   Liters per Minute 2   Frequency Continuous (stationary and portable oxygen unit needed)   Oxygen delivery system Gas      04/07/16 1058   04/07/16  0000  For home use only DME oxygen    Question Answer Comment  Liters per Minute 2   Frequency Continuous (stationary and portable oxygen unit needed)   Oxygen delivery system Gas      04/07/16 1128     Follow-up Information    CRAM,GARY P, MD Follow up in 1 week(s).   Specialty:  Neurosurgery Contact information: [address] [place] [address] [place], DO Follow up in 1 week(s).   Specialty:  Family Medicine Contact information: [address] [place] [address] [place] Follow up.   Why:  [place] physical therapy;respiratory care Contact information: [address] [place] [address] [phone]        Inc[facility] - [facility] Follow up.   Why:  home oxygen Contact information: [place] [address] [phone]          Allergies  Allergen Reactions  . Aspirin Other (See Comments)    Cannot have due to Xarelto and previous bleed  . Ciprofloxacin Other (See Comments) and Rash    Cant be given with her multaq     Consultations:  Cardiology  Neurosurgery by Telephone Consultation Dr. Ronnald Ramp  Procedures/Studies: Dg Chest 2 View  Result Date: 04/05/2016 CLINICAL DATA:  Fall this morning, cough, shortness of Breath EXAM: CHEST  2 VIEW COMPARISON:  11/20/2014 FINDINGS: Cardiomediastinal silhouette is stable. Again noted chronic interstitial prominence and peripheral fibrotic changes. There is patchy atelectasis or infiltrate in left lower lobe. Osteopenia and degenerative changes thoracic spine. IMPRESSION: Again noted chronic interstitial prominence and peripheral fibrotic changes. There is patchy atelectasis or infiltrate in left lower lobe. Electronically Signed   By: Lahoma Crocker M.D.   On: 04/05/2016 12:00   Dg Lumbar Spine Complete  Result Date: 04/05/2016 CLINICAL DATA:  [place] at home this morning. Moderate to severe mid low back pain worse with movement. EXAM: LUMBAR SPINE - COMPLETE 4+ VIEW COMPARISON:  06/16/2004 FINDINGS: Mild lumbar scoliosis convex towards the right. Diffuse degenerative change throughout the lumbar spine with narrowed interspaces and endplate hypertrophic changes. No anterior subluxation of the vertebrae. Degenerative changes in the facet joints. There is mild compression of the superior endplates of 624THL and 624THL. This is occurred in the interval since the prior study and acute compression is not excluded. Changes likely to rest present osteoporosis. No retropulsion of fracture fragments. The bone cortex appears intact. No destructive bone lesions. Visualize sacrum appears intact. Aortic atherosclerosis. Pelvic calcifications consistent with uterine fibroids. IMPRESSION: Degenerative changes and scoliosis of the lumbar spine similar to previous study. Endplate compression deformities at T11 and T12 liver occurred since previous study and could be acute or chronic. Changes likely due to osteoporosis. Electronically Signed   By: Lucienne Capers M.D.   On: 04/05/2016 06:03   Ct  Thoracic Spine Wo Contrast  Result Date: 04/05/2016 CLINICAL DATA:  Status post fall at 2 a.m. this morning with onset of back pain. EXAM: CT THORACIC AND LUMBAR SPINE WITHOUT CONTRAST TECHNIQUE: Multidetector CT imaging of the thoracic and lumbar spine was performed without contrast. Multiplanar CT image reconstructions were also generated. COMPARISON:  Plain films lumbar spine earlier today. PA and lateral chest 01/20/2015. CT chest 03/28/2011. FINDINGS: CT THORACIC SPINE FINDINGS Alignment: Maintained. Vertebrae: Bones appear osteopenic. There is a mild compression fracture deformity  of T12. Vertebral body height loss is estimated at up to 15% eccentric to the left. The fracture appears acute with sharp fracture margins identified. No involvement of the posterior elements is seen. No other fracture is identified. Paraspinal and other soft tissues: There is cardiomegaly. Calcific aortic and coronary atherosclerosis is identified. Small bilateral pleural effusions are seen. There is coarsening of the pulmonary interstitium bilaterally and extensive ground-glass attenuation. Disc levels: Unremarkable. CT LUMBAR SPINE FINDINGS Segmentation: Unremarkable. Alignment: Convex right scoliosis with the apex at L2-3 is noted. Vertebrae: Bones appear osteopenic.  No fracture is identified. Paraspinal and other soft tissues: Aortoiliac atherosclerosis without aneurysm is noted. There is partial visualization of a right renal cyst. Disc levels: Scattered mild disc bulging is most notable at L2-3 and L3-4. The central canal and foramina appear open. IMPRESSION: CT THORACIC SPINE IMPRESSION T12 compression fracture appears acute. Vertebral body height loss is mild at up to 15%. No extension into the posterior elements or associated central canal compromise is identified. Possible T11 fracture described on report of plain films earlier today is not appreciated on this study. Osteopenia. Cardiomegaly. Calcific aortic and  coronary atherosclerosis. Progressive pulmonary fibrosis since the patient's comparison chest CT. Ground-glass attenuation in the lungs could be due to active inflammation, atelectasis or possibly edema with small bilateral pleural effusions noted. CT LUMBAR SPINE IMPRESSION No acute abnormality in the lumbar spine. Convex right scoliosis. Degenerative disease without central canal stenosis. Electronically Signed   By: Inge Rise M.D.   On: 04/05/2016 08:03   Ct Lumbar Spine Wo Contrast  Result Date: 04/05/2016 CLINICAL DATA:  Status post fall at 2 a.m. this morning with onset of back pain. EXAM: CT THORACIC AND LUMBAR SPINE WITHOUT CONTRAST TECHNIQUE: Multidetector CT imaging of the thoracic and lumbar spine was performed without contrast. Multiplanar CT image reconstructions were also generated. COMPARISON:  Plain films lumbar spine earlier today. PA and lateral chest 01/20/2015. CT chest 03/28/2011. FINDINGS: CT THORACIC SPINE FINDINGS Alignment: Maintained. Vertebrae: Bones appear osteopenic. There is a mild compression fracture deformity of T12. Vertebral body height loss is estimated at up to 15% eccentric to the left. The fracture appears acute with sharp fracture margins identified. No involvement of the posterior elements is seen. No other fracture is identified. Paraspinal and other soft tissues: There is cardiomegaly. Calcific aortic and coronary atherosclerosis is identified. Small bilateral pleural effusions are seen. There is coarsening of the pulmonary interstitium bilaterally and extensive ground-glass attenuation. Disc levels: Unremarkable. CT LUMBAR SPINE FINDINGS Segmentation: Unremarkable. Alignment: Convex right scoliosis with the apex at L2-3 is noted. Vertebrae: Bones appear osteopenic.  No fracture is identified. Paraspinal and other soft tissues: Aortoiliac atherosclerosis without aneurysm is noted. There is partial visualization of a right renal cyst. Disc levels: Scattered mild  disc bulging is most notable at L2-3 and L3-4. The central canal and foramina appear open. IMPRESSION: CT THORACIC SPINE IMPRESSION T12 compression fracture appears acute. Vertebral body height loss is mild at up to 15%. No extension into the posterior elements or associated central canal compromise is identified. Possible T11 fracture described on report of plain films earlier today is not appreciated on this study. Osteopenia. Cardiomegaly. Calcific aortic and coronary atherosclerosis. Progressive pulmonary fibrosis since the patient's comparison chest CT. Ground-glass attenuation in the lungs could be due to active inflammation, atelectasis or possibly edema with small bilateral pleural effusions noted. CT LUMBAR SPINE IMPRESSION No acute abnormality in the lumbar spine. Convex right scoliosis. Degenerative disease without central canal stenosis.  Electronically Signed   By: Inge Rise M.D.   On: 04/05/2016 08:03    Subjective: Seen and examined at bedside and doing well. No active complaints and wanting to go home.   Discharge Exam: Vitals:   04/07/16 0500 04/07/16 0606  BP: 121/81 127/79  Pulse:    Resp:    Temp:     Vitals:   04/07/16 0320 04/07/16 0343 04/07/16 0500 04/07/16 0606  BP: 104/76 110/75 121/81 127/79  Pulse: 74 80    Resp:  (!) 24    Temp:      TempSrc:      SpO2:  94%    Weight:      Height:       General: Pt is alert, awake, not in acute distress Cardiovascular: Irregularly Irregular, S1/S2 +, no rubs, no gallops Respiratory: Diminished but CTA bilaterally, no wheezing, no rhonchi Abdominal: Soft, NT, ND, bowel sounds + Extremities: no edema, no cyanosis  The results of significant diagnostics from this hospitalization (including imaging, microbiology, ancillary and laboratory) are listed below for reference.    Microbiology: No results found for this or any previous visit (from the past 240 hour(s)).   Labs: BNP (last 3 results) No results for input(s):  BNP in the last 8760 hours. Basic Metabolic Panel:  Recent Labs Lab 04/05/16 0640 04/05/16 1635 04/06/16 0803 04/07/16 0503  NA 133*  --  132* 130*  K 4.4  --  4.5 5.0  CL 104  --  102 99*  CO2 21*  --  22 23  GLUCOSE 119*  --  130* 118*  BUN 21*  --  28* 23*  CREATININE 1.19*  --  1.16* 0.95  CALCIUM 8.8*  --  9.0 9.2  MG  --  2.1 2.0 2.0  PHOS  --   --  3.8 3.5   Liver Function Tests:  Recent Labs Lab 04/06/16 0803 04/07/16 0503  AST 22 19  ALT 12* 13*  ALKPHOS 68 64  BILITOT 1.7* 1.8*  PROT 7.0 6.9  ALBUMIN 3.7 3.6   No results for input(s): LIPASE, AMYLASE in the last 168 hours. No results for input(s): AMMONIA in the last 168 hours. CBC:  Recent Labs Lab 04/05/16 0640 04/06/16 0803 04/07/16 0403  WBC 11.5* 12.4* 13.8*  NEUTROABS 9.2* 9.3* 10.5*  HGB 15.5* 14.7 13.8  HCT 45.7 44.0 40.4  MCV 95.2 96.3 94.6  PLT 277 274 269   Cardiac Enzymes: No results for input(s): CKTOTAL, CKMB, CKMBINDEX, TROPONINI in the last 168 hours. BNP: Invalid input(s): POCBNP CBG: No results for input(s): GLUCAP in the last 168 hours. D-Dimer No results for input(s): DDIMER in the last 72 hours. Hgb A1c No results for input(s): HGBA1C in the last 72 hours. Lipid Profile No results for input(s): CHOL, HDL, LDLCALC, TRIG, CHOLHDL, LDLDIRECT in the last 72 hours. Thyroid function studies  Recent Labs  04/05/16 1635  TSH 1.930   Anemia work up No results for input(s): VITAMINB12, FOLATE, FERRITIN, TIBC, IRON, RETICCTPCT in the last 72 hours. Urinalysis    Component Value Date/Time   COLORURINE AMBER (A) 04/05/2016 1345   APPEARANCEUR CLEAR 04/05/2016 1345   LABSPEC 1.025 04/05/2016 1345   PHURINE 6.0 04/05/2016 1345   GLUCOSEU NEGATIVE 04/05/2016 1345   HGBUR NEGATIVE 04/05/2016 1345   HGBUR negative 03/23/2010 0846   BILIRUBINUR NEGATIVE 04/05/2016 1345   BILIRUBINUR Neg 10/21/2015 1820   KETONESUR NEGATIVE 04/05/2016 1345   PROTEINUR 100 (A) 04/05/2016  1345  UROBILINOGEN 2.0 10/21/2015 1820   UROBILINOGEN 1.0 03/27/2011 1112   NITRITE NEGATIVE 04/05/2016 1345   LEUKOCYTESUR NEGATIVE 04/05/2016 1345   Sepsis Labs Invalid input(s): PROCALCITONIN,  WBC,  LACTICIDVEN Microbiology No results found for this or any previous visit (from the past 240 hour(s)).  Time coordinating discharge: Over 30 minutes  SIGNED:  Kerney Elbe, DO [facility] 04/07/2016, 12:49 PM Pager [phone]  If 7PM-7AM, please contact night-coverage [email or web address] Password [email or web address]

## 2016-04-07 NOTE — Progress Notes (Signed)
SATURATION QUALIFICATIONS: (This note is used to comply with regulatory documentation for home oxygen)  Patient Saturations on Room Air at Rest = 85%  Patient Saturations on Room Air while Ambulating =   Patient Saturations on 2 Liters of oxygen while Ambulating = 95%  Please briefly explain why patient needs home oxygen: At rest patient O2 sat is 85-88%

## 2016-04-07 NOTE — Progress Notes (Signed)
Notified MD Baltazar Najjar of HR dropping into 50s-60s, ordered to titrate cardizem down to 5.

## 2016-04-07 NOTE — Care Management Note (Signed)
Case Management Note  Patient Details  Name: MALIEA HORGER MRN: [id number] Date of Birth: Feb 25, 1927  Subjective/Objective: qualifies for home 02;home 02 orders placed-[facility] dme rep Jermaine aware to deliver home 02 to rm prior d/c.[facility] rep Manuela Schwartz already aware of d/c & HHC orders. No further CM needs.                   Action/Plan:d/c home w/HHC/DME   Expected Discharge Date:   (unknown)               Expected Discharge Plan:  [place]  In-House Referral:     Discharge planning Services  CM Consult  Post Acute Care Choice:    Choice offered to:  Patient  DME Arranged:  Oxygen DME Agency:  [place]:  PT [place]:  [place]  Status of Service:  Completed, signed off  If discussed at [place] of Stay Meetings, dates discussed:    Additional Comments:  Dessa Phi, RN 04/07/2016, 11:34 AM

## 2016-04-10 ENCOUNTER — Telehealth: Payer: Self-pay | Admitting: *Deleted

## 2016-04-10 NOTE — Telephone Encounter (Signed)
Unable to reach patient at time of TCM Call.  Left message for patient to return call when available.    Hospital follow-up appt scheduled for 04/13/16 @ 11:30am.

## 2016-04-10 NOTE — Telephone Encounter (Signed)
Daughter Manuela Schwartz) request a call back @ [phone] to see what patient can take because she has not had a BM since last Monday 11/6.

## 2016-04-11 ENCOUNTER — Ambulatory Visit (HOSPITAL_COMMUNITY): Payer: Medicare Other | Admitting: Nurse Practitioner

## 2016-04-11 NOTE — Telephone Encounter (Signed)
Transition Care Management Follow-up Telephone Call  Per Discharge Summary: Admit date: 04/05/2016 Discharge date: 04/07/2016  Admitted From: Home Disposition: [place] with PT  Recommendations for Outpatient Follow-up:  1. Follow up with PCP in 1-2 weeks 2. Follow up with Neurosurgery Dr. Saintclair Halsted in 1 week 3. Follow up with Atrial Fibrillation Clinic 4. Follow up with Cardiology 5. Please obtain BMP/CBC within week  Home Health: YES  Equipment/Devices: TSO Brace; Home O2 2 Liters via [place]  Discharge Condition: Stable CODE STATUS: FULL Diet recommendation: Heart Healthy  --  Call completed w/ pt's daughter, Manuela Schwartz.    How have you been since you were released from the hospital? "I think she's feeling better because she's starting to get frustrated that she can't do things for herself."   Do you understand why you were in the hospital? yes   Do you understand the discharge instructions? yes   Where were you discharged to? Home   Items Reviewed:  Medications reviewed: yes  Allergies reviewed: yes  Dietary changes reviewed: no  Referrals reviewed: yes   Functional Questionnaire:   Activities of Daily Living (ADLs):   She states they are independent in the following: feeding, continence and grooming States they require assistance with the following: ambulation, bathing and hygiene and dressing-wearing brace and using walker. Currently living w/ daughter who is assisting w/ ADLs.   Any transportation issues/concerns?: no   Any patient concerns? yes, constipation. LBM 04/03/16, pt normally has soft daily BM. She is taking Colace TID since Sunday and still no BM. She is eating prunes and bananas. Did have decreased appetite in hospital, but is now eating regularly. Denies abd pain. Pt also has congested cough and daughter wants to make sure her lungs are listened to at appt. They have not heard from [facility], but nurse did come out to do assessment.  She will call [facility] later today if she does not hear from them prior. Please advise if pt can take Miralax (pt has this on hand) or other OTC med for constipation and I will call daughter back.   Confirmed importance and date/time of follow-up visits scheduled yes   Provider Appointment booked with Dr. Carollee Herter 04/13/16 @ 11:30am  Confirmed with patient if condition begins to worsen call PCP or go to the ER.  Patient was given the office number and encouraged to call back with question or concerns.  : yes

## 2016-04-12 ENCOUNTER — Ambulatory Visit (HOSPITAL_COMMUNITY)
Admission: RE | Admit: 2016-04-12 | Discharge: 2016-04-12 | Disposition: A | Discharge status: Home / Self Care | Payer: Medicare Other | Source: Ambulatory Visit | Attending: Nurse Practitioner | Admitting: Nurse Practitioner

## 2016-04-12 ENCOUNTER — Encounter (HOSPITAL_COMMUNITY): Payer: Self-pay | Admitting: Nurse Practitioner

## 2016-04-12 VITALS — BP 120/76 | HR 90 | Ht 60.0 in | Wt 143.4 lb

## 2016-04-12 DIAGNOSIS — I481 Persistent atrial fibrillation: Secondary | ICD-10-CM | POA: Insufficient documentation

## 2016-04-12 DIAGNOSIS — K5900 Constipation, unspecified: Secondary | ICD-10-CM | POA: Diagnosis not present

## 2016-04-12 DIAGNOSIS — Z79899 Other long term (current) drug therapy: Secondary | ICD-10-CM | POA: Diagnosis not present

## 2016-04-12 DIAGNOSIS — I4821 Permanent atrial fibrillation: Secondary | ICD-10-CM

## 2016-04-12 DIAGNOSIS — S22089A Unspecified fracture of T11-T12 vertebra, initial encounter for closed fracture: Secondary | ICD-10-CM | POA: Insufficient documentation

## 2016-04-12 DIAGNOSIS — Z87891 Personal history of nicotine dependence: Secondary | ICD-10-CM | POA: Insufficient documentation

## 2016-04-12 DIAGNOSIS — Z7901 Long term (current) use of anticoagulants: Secondary | ICD-10-CM | POA: Insufficient documentation

## 2016-04-12 DIAGNOSIS — I5032 Chronic diastolic (congestive) heart failure: Secondary | ICD-10-CM | POA: Insufficient documentation

## 2016-04-12 DIAGNOSIS — J849 Interstitial pulmonary disease, unspecified: Secondary | ICD-10-CM | POA: Insufficient documentation

## 2016-04-12 DIAGNOSIS — W19XXXA Unspecified fall, initial encounter: Secondary | ICD-10-CM | POA: Diagnosis not present

## 2016-04-12 DIAGNOSIS — I482 Chronic atrial fibrillation: Secondary | ICD-10-CM | POA: Diagnosis not present

## 2016-04-12 DIAGNOSIS — N183 Chronic kidney disease, stage 3 (moderate): Secondary | ICD-10-CM | POA: Diagnosis not present

## 2016-04-12 DIAGNOSIS — I130 Hypertensive heart and chronic kidney disease with heart failure and stage 1 through stage 4 chronic kidney disease, or unspecified chronic kidney disease: Secondary | ICD-10-CM | POA: Insufficient documentation

## 2016-04-12 NOTE — Progress Notes (Signed)
Patient ID: Allison Sellers, female   DOB: 1926/08/14, 82 y.o.   MRN: [id number]      Primary Care Physician: Ann Held, DO Referring Physician:[facility]   Allison Sellers is a 82 y.o. female with a h/o PAF on multaq and metoprolol, with persistent afib  for 7-10 days at which time v rates were 125 bpm range.  The last time she had breakthrough afib was last June at which time she required cardioversion, 3 previous cardioversions prior to this. She does have interstitial lung disease which would  make her not  an ideal candidate for amiodarone use. She does have CKD, which would significantly limit dose of tiksoyn to the effect it may not be effective. She is not a ablation candidate due to age. Also not a flecainide/sotalol candidate due to renal function. She reports that she does not feel bad in afib and for most part since increase of metoprolol to 25 mg bid,  she has decent rate control and  BP is OK with higher dose . She is on lower dose of xarelto at 15 mg with cr cl calc at 31.0 ml/min.  She returns today with afib rate controlled. Discussed with Dr. Ellyn Hack and he would like to see if she can be returned to [place], so after  conversation with the pt and daughter, they would like to pursue cardioversion. She thinks she may have missed one dose of xarelto around January 7th, otherwise no other known missed doses. Unfortunately, due to above paragraph, she is not a good candidate for other antiarrhythmics, so will be left on multaq.  Called the office right before cardioversion and found to be in SR, so DCCV cancelled. She now returns 2/22 and is back in afib with v rate of 122. She is unaware of afib and most part feels well and the irregular heart beat does not bother her.I think since she is having so much afib, mostly asymptomatic, and not a good candidate for other antiarrythmic's, that multaq should be stopped and will attempt better rate control. She and family are in  agreement.She continues on xarelto.  Seen back in afib office 3/1. Last visit cardizem 120 mg qd was added and  multaq was stopped to aim for rate control. She continues to feel well. Does not notice afib. Able to do her house hold activities and occasionally will feel the need to rest for about 10 mins if she gets tired. No heart failure symptoms. She brings in her numbers form home and for the most part are 100 or less with some readings 65-70. Her heart rate is elevated here but she says that she is nervous on her visits.   Placed a monitor on pt to check for rate control and it showed afib with RVR with rates up to 190. I discussed with Dr. Rayann Heman and she is not a candidate for any rhythm control due to CKD, and amiodarone not an option due to pulmonary fibrosis. She is still tolerating this very well. Has rvr today, but is unaware. Not showing any signs of fluid overload. She only feels winded if she has to climb stairs. She shows me her HR/BP readings from home  and her heart rate for the most part in less that 100, with 2 readings of 124 and 132. I thinks she has sufficient BP to try to add another dose of cardizem 120 mg in the pm. To help with this, I will cut the lisinopril dose  in half for now.  After several attempts to rate control pt, she continued with afib with rvr. She was give an appointment with Dr. Rayann Heman for options. He wanted to try further rate control, ordered an echo, mentioned consideration use of flecainide but would require EKG and flecainide level within one week. Pt is feeling more short of breath with activities. Other wise BP is holding  with extra rate control meds. HR at home for most part is recorded less than 100 but with walking in room today, HR 140's but then settled down to around 100 with EKG. She is pending echo within one week and would like to make sure EF is normal before starting flecainide, with pt c/o of more shortness of breath. Last option would be per Dr.  Rayann Heman is AV nodal ablation and PPM.  She was last seen by Dr. Rayann Heman in May at which time her v rate was 123. He increased toprol to 50 mg am and 25 mg pm. She has tolerated this and v rate today is 108 bpm. She says that she feels well and does all her activities of daily living without issues. If she brings groceries up the steps, she has to sit and rest for several minutes before she can put them up.  Echo in May showed mild global reduction in LV function, biatrial enlargement, trace MR and TR. Reports v rates at home in the 70's/80's.  F/u in afib clinic, 11/1, pt continues to do well with long standing permanent afib. She will be 89 in a few weeks and still drives around her neighborhood, and takes care of her house. She rarely feels the afib. HR is 110 in clinic but pt states that HR when she checks in lower than 100. No bleeding issues.  Has a f/u with afib clinic today after pt had a mechanical fall around 2am 11/8 and was taken to [facility]. She was found to have a fx of T12 and is wearing a brace and will be followed as a outpatient. She was almost ready to go home when she was found to have afib with RVR in the 160's. She was admitted for better rate control. Rate control meds were increased and she was discharged on 11/10. She was also d/c on 02 with desaturation found with walking. She was discharged on oxycodone for pain and has not had a bowel movement x one week. She is taking colace. HR is controlled today.  Today, she denies symptoms of palpitations, chest pain,orthopnea, PND, lower extremity edema, dizziness, presyncope, syncope, or neurologic sequela. Shortness of breath with activities. Weight is stable.The patient is tolerating medications without difficulties and is otherwise without complaint today.   Past Medical History:  Diagnosis Date  . Chronic diastolic CHF (congestive heart failure) ([place])   . CKD (chronic kidney disease), stage III   . Diverticular disease     . Hypertension   . Interstitial lung disease ([place])    a. Noted on CT scan 02/2011 before initiation of any antiarrhythmic meds.  . Osteopenia   . Persistent atrial fibrillation ([place])   . Seborrheic keratosis 06/2014   of hand.    Past Surgical History:  Procedure Laterality Date  . 2D Echocardiogram  03/29/2011   EF 55-65%, normal  . CARDIOVERSION  04/18/2011   Procedure: CARDIOVERSION;  Surgeon: Leonie Man;  Location: [facility];  Service: Cardiovascular;  Laterality: N/A;  . CARDIOVERSION N/A 11/20/2014   Procedure: CARDIOVERSION;  Surgeon: Shaune Pascal  Bensimhon, MD;  Location: [place];  Service: Cardiovascular;  Laterality: N/A;  . COLONOSCOPY N/A 11/03/2014   Procedure: COLONOSCOPY;  Surgeon: Ladene Artist, MD;  Location: [facility] ENDOSCOPY;  Service: Endoscopy;  Laterality: N/A;  . Lexiscan Myoview  04/14/2011   No scintigraphic evidence of inducible myocardial ischemia, EKG negative for ischemia, patient developed Atrial Fibrillation with rapid ventricular response during stress and persisted in the recovery period, abnormal myocardial perfusion study  . UMBILICAL HERNIA REPAIR  ? 2013    Current Outpatient Prescriptions  Medication Sig Dispense Refill  . diltiazem (CARDIZEM CD) 240 MG 24 hr capsule Take 1 capsule (240 mg total) by mouth at bedtime. 30 capsule 0  . diltiazem (CARDIZEM CD) 240 MG 24 hr capsule Take 240 mg by mouth 2 (two) times daily.    Marland Kitchen docusate sodium (COLACE) 100 MG capsule Take 1 capsule (100 mg total) by mouth 2 (two) times daily as needed for mild constipation. (Patient taking differently: Take 100 mg by mouth 3 (three) times daily as needed for mild constipation. ) 60 capsule 0  . lisinopril (PRINIVIL,ZESTRIL) 5 MG tablet Take 0.5 tablets (2.5 mg total) by mouth daily. 90 tablet 3  . metoprolol succinate (TOPROL-XL) 25 MG 24 hr tablet Take 75 mg by mouth daily.    . metoprolol succinate (TOPROL-XL) 50 MG 24 hr tablet Take 1 tablet (50 mg total) by mouth at bedtime.  Take with or immediately following a meal. 30 tablet 0  . nitroGLYCERIN (NITROSTAT) 0.4 MG SL tablet Place 1 tablet (0.4 mg total) under the tongue every 5 (five) minutes as needed. For chest pain 25 tablet 8  . oxyCODONE-acetaminophen (PERCOCET/ROXICET) 5-325 MG tablet Take 1 tablet by mouth every 6 (six) hours as needed for severe pain. 15 tablet 0  . Rivaroxaban (XARELTO) 15 MG TABS tablet Take 1 tablet (15 mg total) by mouth daily. (Patient taking differently: Take 15 mg by mouth every evening. ) 90 tablet 3  . traMADol (ULTRAM) 50 MG tablet Take 1 tablet (50 mg total) by mouth every 12 (twelve) hours as needed for severe pain. 20 tablet 0  . Vitamin D, Ergocalciferol, (DRISDOL) 50000 UNITS CAPS capsule Take 50,000 Units by mouth every 7 (seven) days. Wednesday     No current facility-administered medications for this encounter.     Allergies  Allergen Reactions  . Aspirin Other (See Comments)    Cannot have due to Xarelto and previous bleed  . Ciprofloxacin Other (See Comments) and Rash    Cant be given with her multaq    Social History   Social History  . Marital status: Widowed    Spouse name: N/A  . Number of children: N/A  . Years of education: N/A   Occupational History  . Not on file.   Social History Main Topics  . Smoking status: Never Smoker  . Smokeless tobacco: Never Used  . Alcohol use No  . Drug use: No  . Sexual activity: Not Currently   Other Topics Concern  . Not on file   Social History Narrative   She is a widowed mother of 36, grandmother of 20, great grandmother of 1. She is usually very active and likes to walk .    recently.    She quit smoking 50 years ago. She denies any alcohol.    She says in order to try to make up for the fact she has not been doing much exercise she has been walking up and down the  stairs at the house and staying as active as possible, just doing whatever activity she can do.     Family History  Problem Relation Age of  Onset  . Heart failure Father     CHF  . Heart failure Mother     CHF  . Macular degeneration Mother   . Arthritis      ROS- All systems are reviewed and negative except as per the HPI above  Physical Exam: Vitals:   04/12/16 1056  BP: 120/76  Pulse: 90  Weight: 143 lb 6.4 oz (65 kg)  Height: 5' (1.524 m)    GEN- The patient is well appearing, alert and oriented x 3 today.   Head- normocephalic, atraumatic Eyes-  Sclera clear, conjunctiva pink Ears- hearing intact Oropharynx- clear Neck- supple, no JVP Lymph- no cervical lymphadenopathy Lungs- Clear to ausculation bilaterally, normal work of breathing, few crackles in bases (chronic interstitial  lung disease) Heart- Rapid, irregular rate and rhythm, no murmurs, rubs or gallops, PMI not laterally displaced GI- soft, NT, ND, + BS Extremities- no clubbing, cyanosis, or edema MS- no significant deformity or atrophy Skin- no rash or lesion Psych- euthymic mood, full affect Neuro- strength and sensation are intact  EKG- afib at 90 bpm, qrs int 82 ms, qtc 425 ms Epic records reviewed  Assessment and Plan:  1. Afib Longstanding permanent, increase in HR with recent fall and T12 fx Continue cardizem 240 mg bid Continue metoprolol succinate 75 mg am and 50 mg am Continue xarelto  2. T 12 fx Per neurosurgeon, has f/u this week  3. Constipation Continue colace Can try miralax Further instructions per PCP on appointment tomorrow  F/u in one month  Butch Penny C. Ranjit Ashurst, [facility] [address] [place], [place] [address] [phone]

## 2016-04-13 ENCOUNTER — Encounter: Payer: Self-pay | Admitting: Family Medicine

## 2016-04-13 ENCOUNTER — Ambulatory Visit (INDEPENDENT_AMBULATORY_CARE_PROVIDER_SITE_OTHER): Payer: Medicare Other | Admitting: Family Medicine

## 2016-04-13 ENCOUNTER — Telehealth: Payer: Self-pay | Admitting: Family Medicine

## 2016-04-13 VITALS — BP 113/71 | HR 86 | Temp 97.5°F | Ht 60.0 in | Wt 141.4 lb

## 2016-04-13 DIAGNOSIS — I482 Chronic atrial fibrillation, unspecified: Secondary | ICD-10-CM

## 2016-04-13 DIAGNOSIS — Z23 Encounter for immunization: Secondary | ICD-10-CM | POA: Diagnosis not present

## 2016-04-13 DIAGNOSIS — I480 Paroxysmal atrial fibrillation: Secondary | ICD-10-CM | POA: Diagnosis not present

## 2016-04-13 DIAGNOSIS — F3289 Other specified depressive episodes: Secondary | ICD-10-CM

## 2016-04-13 DIAGNOSIS — S22000A Wedge compression fracture of unspecified thoracic vertebra, initial encounter for closed fracture: Secondary | ICD-10-CM

## 2016-04-13 DIAGNOSIS — M4854XA Collapsed vertebra, not elsewhere classified, thoracic region, initial encounter for fracture: Secondary | ICD-10-CM | POA: Diagnosis not present

## 2016-04-13 LAB — CBC WITH DIFFERENTIAL/PLATELET
BASOS PCT: 0.1 % (ref 0.0–3.0)
Basophils Absolute: 0 10*3/uL (ref 0.0–0.1)
EOS PCT: 1.4 % (ref 0.0–5.0)
Eosinophils Absolute: 0.2 10*3/uL (ref 0.0–0.7)
HCT: 40.3 % (ref 36.0–46.0)
HEMOGLOBIN: 13.6 g/dL (ref 12.0–15.0)
LYMPHS ABS: 1.2 10*3/uL (ref 0.7–4.0)
Lymphocytes Relative: 8.2 % — ABNORMAL LOW (ref 12.0–46.0)
MCHC: 33.8 g/dL (ref 30.0–36.0)
MCV: 93.9 fl (ref 78.0–100.0)
MONO ABS: 1.2 10*3/uL — AB (ref 0.1–1.0)
MONOS PCT: 7.8 % (ref 3.0–12.0)
Neutro Abs: 12.3 10*3/uL — ABNORMAL HIGH (ref 1.4–7.7)
Neutrophils Relative %: 82.5 % — ABNORMAL HIGH (ref 43.0–77.0)
Platelets: 403 10*3/uL — ABNORMAL HIGH (ref 150.0–400.0)
RBC: 4.29 Mil/uL (ref 3.87–5.11)
RDW: 14.6 % (ref 11.5–15.5)
WBC: 14.9 10*3/uL — AB (ref 4.0–10.5)

## 2016-04-13 LAB — BASIC METABOLIC PANEL
BUN: 22 mg/dL (ref 6–23)
CALCIUM: 9.3 mg/dL (ref 8.4–10.5)
CO2: 28 meq/L (ref 19–32)
CREATININE: 0.94 mg/dL (ref 0.40–1.20)
Chloride: 93 mEq/L — ABNORMAL LOW (ref 96–112)
GFR: 59.87 mL/min — AB (ref >60.00)
GLUCOSE: 113 mg/dL — AB (ref 70–99)
Potassium: 5.5 mEq/L — ABNORMAL HIGH (ref 3.5–5.1)
SODIUM: 128 meq/L — AB (ref 135–145)

## 2016-04-13 NOTE — Progress Notes (Signed)
Pre visit review using our clinic tool,if applicable. No additional management support is needed unless otherwise documented below in the visit note.  

## 2016-04-13 NOTE — Progress Notes (Signed)
Patient ID: Allison Sellers, female    DOB: 09-11-1926  Age: 82 y.o. MRN: [id number]    Subjective:  Subjective  HPI Allison Sellers presents for hospital f/u T12 vertebral body fracture. And a fib   She had fallen in bathroom went to ER and was going to be d/c when she went into a fib.  She was d/c on oxygen as well.    Review of Systems  Constitutional: Negative for activity change, appetite change, fatigue and unexpected weight change.  Respiratory: Negative for cough and shortness of breath.   Cardiovascular: Negative for chest pain and palpitations.  Psychiatric/Behavioral: Negative for behavioral problems and dysphoric mood. The patient is not nervous/anxious.     History Past Medical History:  Diagnosis Date  . Chronic diastolic CHF (congestive heart failure) ([place])   . CKD (chronic kidney disease), stage III   . Diverticular disease   . Hypertension   . Interstitial lung disease ([place])    a. Noted on CT scan 02/2011 before initiation of any antiarrhythmic meds.  . Osteopenia   . Persistent atrial fibrillation ([place])   . Seborrheic keratosis 06/2014   of hand.     She has a past surgical history that includes Umbilical hernia repair (? 2013); Cardioversion (04/18/2011); Lexiscan Myoview (04/14/2011); 2D Echocardiogram (03/29/2011); Colonoscopy (N/A, 11/03/2014); and Cardioversion (N/A, 11/20/2014).   Her family history includes Heart failure in her father and mother; Macular degeneration in her mother.She reports that she has never smoked. She has never used smokeless tobacco. She reports that she does not drink alcohol or use drugs.  Current Outpatient Prescriptions on File Prior to Visit  Medication Sig Dispense Refill  . diltiazem (CARDIZEM CD) 240 MG 24 hr capsule Take 1 capsule (240 mg total) by mouth at bedtime. 30 capsule 0  . diltiazem (CARDIZEM CD) 240 MG 24 hr capsule Take 240 mg by mouth 2 (two) times daily.    Marland Kitchen docusate sodium (COLACE) 100 MG capsule Take 1 capsule (100  mg total) by mouth 2 (two) times daily as needed for mild constipation. (Patient taking differently: Take 100 mg by mouth 3 (three) times daily as needed for mild constipation. ) 60 capsule 0  . lisinopril (PRINIVIL,ZESTRIL) 5 MG tablet Take 0.5 tablets (2.5 mg total) by mouth daily. 90 tablet 3  . metoprolol succinate (TOPROL-XL) 25 MG 24 hr tablet Take 75 mg by mouth daily.    . metoprolol succinate (TOPROL-XL) 50 MG 24 hr tablet Take 1 tablet (50 mg total) by mouth at bedtime. Take with or immediately following a meal. 30 tablet 0  . nitroGLYCERIN (NITROSTAT) 0.4 MG SL tablet Place 1 tablet (0.4 mg total) under the tongue every 5 (five) minutes as needed. For chest pain 25 tablet 8  . oxyCODONE-acetaminophen (PERCOCET/ROXICET) 5-325 MG tablet Take 1 tablet by mouth every 6 (six) hours as needed for severe pain. 15 tablet 0  . Rivaroxaban (XARELTO) 15 MG TABS tablet Take 1 tablet (15 mg total) by mouth daily. (Patient taking differently: Take 15 mg by mouth every evening. ) 90 tablet 3  . traMADol (ULTRAM) 50 MG tablet Take 1 tablet (50 mg total) by mouth every 12 (twelve) hours as needed for severe pain. 20 tablet 0  . Vitamin D, Ergocalciferol, (DRISDOL) 50000 UNITS CAPS capsule Take 50,000 Units by mouth every 7 (seven) days. Wednesday    . [DISCONTINUED] digoxin (LANOXIN) 0.125 MG tablet Take 125 mcg by mouth daily.       No current  facility-administered medications on file prior to visit.      Objective:  Objective  Physical Exam  Constitutional: She is oriented to person, place, and time. She appears well-developed and well-nourished.  HENT:  Head: Normocephalic and atraumatic.  Eyes: Conjunctivae and EOM are normal.  Neck: Normal range of motion. Neck supple. No JVD present. Carotid bruit is not present. No thyromegaly present.  Cardiovascular: An irregularly irregular rhythm present.  Pulmonary/Chest: Effort normal and breath sounds normal. No respiratory distress. She has no  wheezes. She has no rales. She exhibits no tenderness.  Musculoskeletal: She exhibits no edema.  Neurological: She is alert and oriented to person, place, and time.  Psychiatric: She has a normal mood and affect.  Nursing note and vitals reviewed.  BP 113/71   Pulse 86   Temp 97.5 F (36.4 C) (Oral)   Ht 5' (1.524 m)   Wt 141 lb 6.4 oz (64.1 kg)   SpO2 97% Comment: O2@2L /m  BMI 27.62 kg/m  Wt Readings from Last 3 Encounters:  04/13/16 141 lb 6.4 oz (64.1 kg)  04/12/16 143 lb 6.4 oz (65 kg)  04/05/16 134 lb 11.2 oz (61.1 kg)     Lab Results  Component Value Date   WBC 14.9 (H) 04/13/2016   HGB 13.6 04/13/2016   HCT 40.3 04/13/2016   PLT 403.0 (H) 04/13/2016   GLUCOSE 113 (H) 04/13/2016   CHOL 128 05/11/2015   TRIG 97.0 05/11/2015   HDL 56.10 05/11/2015   LDLCALC 52 05/11/2015   ALT 13 (L) 04/07/2016   AST 19 04/07/2016   NA 128 (L) 04/13/2016   K 5.5 (H) 04/13/2016   CL 93 (L) 04/13/2016   CREATININE 0.94 04/13/2016   BUN 22 04/13/2016   CO2 28 04/13/2016   TSH 1.930 04/05/2016   INR 1.20 11/03/2014   HGBA1C 5.3 05/19/2014    No results found.   Assessment & Plan:  Plan  I am having Ms. Rogers Blocker maintain her Vitamin D (Ergocalciferol), Rivaroxaban, nitroGLYCERIN, lisinopril, diltiazem, metoprolol succinate, traMADol, docusate sodium, oxyCODONE-acetaminophen, metoprolol succinate, and diltiazem.  No orders of the defined types were placed in this encounter.   Problem List Items Addressed This Visit      Unprioritized   Chronic atrial fibrillation ([place]) - Primary   Relevant Orders   CBC with Differential/Platelet (Completed)   Basic metabolic panel (Completed)   Compression fracture of body of thoracic vertebra [facility])    Per neurosurgeon Pt to have kyphoplasty       Depression    Pt reluctant to take anything now She will think about it and let us know      PAF (paroxysmal atrial fibrillation) ([place]) (Chronic)    Per cardiology con't [place]         Other Visit Diagnoses    Encounter for immunization       Relevant Orders   Flu vaccine HIGH DOSE PF (Completed)      Follow-up: Return if symptoms worsen or fail to improve, for as scheduled.  Ann Held, DO

## 2016-04-13 NOTE — Patient Instructions (Signed)
Atrial Fibrillation Atrial fibrillation is a type of irregular or rapid heartbeat (arrhythmia). In atrial fibrillation, the heart quivers continuously in a chaotic pattern. This occurs when parts of the heart receive disorganized signals that make the heart unable to pump blood normally. This can increase the risk for stroke, heart failure, and other heart-related conditions. There are different types of atrial fibrillation, including:  Paroxysmal atrial fibrillation. This type starts suddenly, and it usually stops on its own shortly after it starts.  Persistent atrial fibrillation. This type often lasts longer than a week. It may stop on its own or with treatment.  Long-lasting persistent atrial fibrillation. This type lasts longer than 12 months.  Permanent atrial fibrillation. This type does not go away.  Talk with your health care provider to learn about the type of atrial fibrillation that you have. What are the causes? This condition is caused by some heart-related conditions or procedures, including:  A heart attack.  Coronary artery disease.  Heart failure.  Heart valve conditions.  High blood pressure.  Inflammation of the sac that surrounds the heart (pericarditis).  Heart surgery.  Certain heart rhythm disorders, such as Wolf-Parkinson-White syndrome.  Other causes include:  Pneumonia.  Obstructive sleep apnea.  Blockage of an artery in the lungs (pulmonary embolism, or PE).  Lung cancer.  Chronic lung disease.  Thyroid problems, especially if the thyroid is overactive (hyperthyroidism).  Caffeine.  Excessive alcohol use or illegal drug use.  Use of some medicines, including certain decongestants and diet pills.  Sometimes, the cause cannot be found. What increases the risk? This condition is more likely to develop in:  People who are older in age.  People who smoke.  People who have diabetes mellitus.  People who are overweight  (obese).  Athletes who exercise vigorously.  What are the signs or symptoms? Symptoms of this condition include:  A feeling that your heart is beating rapidly or irregularly.  A feeling of discomfort or pain in your chest.  Shortness of breath.  Sudden light-headedness or weakness.  Getting tired easily during exercise.  In some cases, there are no symptoms. How is this diagnosed? Your health care provider may be able to detect atrial fibrillation when taking your pulse. If detected, this condition may be diagnosed with:  An electrocardiogram (ECG).  A Holter monitor test that records your heartbeat patterns over a 24-hour period.  Transthoracic echocardiogram (TTE) to evaluate how blood flows through your heart.  Transesophageal echocardiogram (TEE) to view more detailed images of your heart.  A stress test.  Imaging tests, such as a CT scan or chest X-ray.  Blood tests.  How is this treated? The main goals of treatment are to prevent blood clots from forming and to keep your heart beating at a normal rate and rhythm. The type of treatment that you receive depends on many factors, such as your underlying medical conditions and how you feel when you are experiencing atrial fibrillation. This condition may be treated with:  Medicine to slow down the heart rate, bring the heart's rhythm back to normal, or prevent clots from forming.  Electrical cardioversion. This is a procedure that resets your heart's rhythm by delivering a controlled, low-energy shock to the heart through your skin.  Different types of ablation, such as catheter ablation, catheter ablation with pacemaker, or surgical ablation. These procedures destroy the heart tissues that send abnormal signals. When the pacemaker is used, it is placed under your skin to help your heart beat in   a regular rhythm.  Follow these instructions at home:  Take over-the counter and prescription medicines only as told by your  health care provider.  If your health care provider prescribed a blood-thinning medicine (anticoagulant), take it exactly as told. Taking too much blood-thinning medicine can cause bleeding. If you do not take enough blood-thinning medicine, you will not have the protection that you need against stroke and other problems.  Do not use tobacco products, including cigarettes, chewing tobacco, and e-cigarettes. If you need help quitting, ask your health care provider.  If you have obstructive sleep apnea, manage your condition as told by your health care provider.  Do not drink alcohol.  Do not drink beverages that contain caffeine, such as coffee, soda, and tea.  Maintain a healthy weight. Do not use diet pills unless your health care provider approves. Diet pills may make heart problems worse.  Follow diet instructions as told by your health care provider.  Exercise regularly as told by your health care provider.  Keep all follow-up visits as told by your health care provider. This is important. How is this prevented?  Avoid drinking beverages that contain caffeine or alcohol.  Avoid certain medicines, especially medicines that are used for breathing problems.  Avoid certain herbs and herbal medicines, such as those that contain ephedra or ginseng.  Do not use illegal drugs, such as cocaine and amphetamines.  Do not smoke.  Manage your high blood pressure. Contact a health care provider if:  You notice a change in the rate, rhythm, or strength of your heartbeat.  You are taking an anticoagulant and you notice increased bruising.  You tire more easily when you exercise or exert yourself. Get help right away if:  You have chest pain, abdominal pain, sweating, or weakness.  You feel nauseous.  You notice blood in your vomit, bowel movement, or urine.  You have shortness of breath.  You suddenly have swollen feet and ankles.  You feel dizzy.  You have sudden weakness or  numbness of the face, arm, or leg, especially on one side of the body.  You have trouble speaking, trouble understanding, or both (aphasia).  Your face or your eyelid droops on one side. These symptoms may represent a serious problem that is an emergency. Do not wait to see if the symptoms will go away. Get medical help right away. Call your local emergency services (911 in the [place] Do not drive yourself to the hospital. This information is not intended to replace advice given to you by your health care provider. Make sure you discuss any questions you have with your health care provider. Document Released: 05/15/2005 Document Revised: 09/22/2015 Document Reviewed: 09/09/2014 Elsevier Interactive Patient Education  2017 [employer]  

## 2016-04-13 NOTE — Telephone Encounter (Signed)
Patient is being seen today for a Hospital follow up appointment. LB

## 2016-04-14 ENCOUNTER — Telehealth: Payer: Self-pay | Admitting: Family Medicine

## 2016-04-14 ENCOUNTER — Telehealth: Payer: Self-pay

## 2016-04-14 MED ORDER — SERTRALINE HCL 25 MG PO TABS: 25.0000 mg | ORAL_TABLET | Freq: Every day | ORAL | 1 refills | Status: AC

## 2016-04-14 NOTE — Telephone Encounter (Signed)
Caller name: Manuela Schwartz Moshir Relationship to patient: Daughter Can be reached: [phone]  Pharmacy:  Reason for call: Patient has decided to take the Zoloft. Need Rx.

## 2016-04-14 NOTE — Telephone Encounter (Signed)
Spoke with pt's daughter and she state that the pt has an January appointment with PCP and if okay to see her at that appointment instead of the 1 mth f/u, and it is okay to see pt in January per PCP. Pt's daughter had no further questions or concerns. LB

## 2016-04-14 NOTE — Telephone Encounter (Signed)
zoloft 25 mg #30  1 po qd ---f/u 1 month

## 2016-04-14 NOTE — Telephone Encounter (Signed)
Rx Zoloft sent per pt's request.

## 2016-04-14 NOTE — Telephone Encounter (Signed)
Patient has decided to take the Zoloft. Need Rx. Please advise.

## 2016-04-16 DIAGNOSIS — F329 Major depressive disorder, single episode, unspecified: Secondary | ICD-10-CM | POA: Insufficient documentation

## 2016-04-16 DIAGNOSIS — F32A Depression, unspecified: Secondary | ICD-10-CM | POA: Insufficient documentation

## 2016-04-16 DIAGNOSIS — I482 Chronic atrial fibrillation, unspecified: Secondary | ICD-10-CM | POA: Insufficient documentation

## 2016-04-16 NOTE — Assessment & Plan Note (Signed)
Per cardiology con't xaralto

## 2016-04-16 NOTE — Assessment & Plan Note (Signed)
Per neurosurgeon Pt to have kyphoplasty

## 2016-04-16 NOTE — Assessment & Plan Note (Signed)
Pt reluctant to take anything now She will think about it and let us know

## 2016-04-17 ENCOUNTER — Telehealth: Payer: Self-pay | Admitting: *Deleted

## 2016-04-17 ENCOUNTER — Other Ambulatory Visit: Payer: Self-pay | Admitting: Neurosurgery

## 2016-04-17 DIAGNOSIS — S22000A Wedge compression fracture of unspecified thoracic vertebra, initial encounter for closed fracture: Secondary | ICD-10-CM

## 2016-04-17 NOTE — Telephone Encounter (Signed)
Request for surgical clearance:  1. What type of PROCEDURE is being performed? VERTEBROPLASTY  2. When is this PROCEDURE scheduled? TBA  3. Are there any medications that need to be held prior to surgery and how long? XARELTO  FOR 1 DAY  4. Name of physician performing surgery? [place]  5. What is your office phone and fax number? PHONE [place];  FAX [phone].

## 2016-04-18 NOTE — Telephone Encounter (Signed)
Error

## 2016-04-18 NOTE — Telephone Encounter (Signed)
OK to hold x 2 days.  Glenetta Hew, MD

## 2016-04-19 NOTE — Telephone Encounter (Signed)
Routed information to [employer].

## 2016-04-21 ENCOUNTER — Inpatient Hospital Stay (HOSPITAL_COMMUNITY): Payer: Medicare Other

## 2016-04-21 ENCOUNTER — Emergency Department (HOSPITAL_COMMUNITY): Payer: Medicare Other

## 2016-04-21 ENCOUNTER — Inpatient Hospital Stay (HOSPITAL_COMMUNITY)
Admission: EM | Admit: 2016-04-21 | Discharge: 2016-04-28 | DRG: 871 | Disposition: E | Payer: Medicare Other | Attending: Internal Medicine | Admitting: Internal Medicine

## 2016-04-21 ENCOUNTER — Encounter (HOSPITAL_COMMUNITY): Payer: Self-pay

## 2016-04-21 DIAGNOSIS — I130 Hypertensive heart and chronic kidney disease with heart failure and stage 1 through stage 4 chronic kidney disease, or unspecified chronic kidney disease: Secondary | ICD-10-CM | POA: Diagnosis present

## 2016-04-21 DIAGNOSIS — I482 Chronic atrial fibrillation: Secondary | ICD-10-CM | POA: Diagnosis present

## 2016-04-21 DIAGNOSIS — I481 Persistent atrial fibrillation: Secondary | ICD-10-CM | POA: Diagnosis present

## 2016-04-21 DIAGNOSIS — I4821 Permanent atrial fibrillation: Secondary | ICD-10-CM | POA: Diagnosis present

## 2016-04-21 DIAGNOSIS — Z9981 Dependence on supplemental oxygen: Secondary | ICD-10-CM

## 2016-04-21 DIAGNOSIS — G9341 Metabolic encephalopathy: Secondary | ICD-10-CM | POA: Diagnosis present

## 2016-04-21 DIAGNOSIS — I4891 Unspecified atrial fibrillation: Secondary | ICD-10-CM | POA: Diagnosis not present

## 2016-04-21 DIAGNOSIS — J9621 Acute and chronic respiratory failure with hypoxia: Secondary | ICD-10-CM | POA: Diagnosis present

## 2016-04-21 DIAGNOSIS — Y92009 Unspecified place in unspecified non-institutional (private) residence as the place of occurrence of the external cause: Secondary | ICD-10-CM

## 2016-04-21 DIAGNOSIS — D6832 Hemorrhagic disorder due to extrinsic circulating anticoagulants: Secondary | ICD-10-CM | POA: Diagnosis present

## 2016-04-21 DIAGNOSIS — Z881 Allergy status to other antibiotic agents status: Secondary | ICD-10-CM

## 2016-04-21 DIAGNOSIS — N179 Acute kidney failure, unspecified: Secondary | ICD-10-CM | POA: Diagnosis present

## 2016-04-21 DIAGNOSIS — Z7901 Long term (current) use of anticoagulants: Secondary | ICD-10-CM

## 2016-04-21 DIAGNOSIS — Y95 Nosocomial condition: Secondary | ICD-10-CM | POA: Diagnosis present

## 2016-04-21 DIAGNOSIS — N189 Chronic kidney disease, unspecified: Secondary | ICD-10-CM

## 2016-04-21 DIAGNOSIS — I959 Hypotension, unspecified: Secondary | ICD-10-CM | POA: Diagnosis present

## 2016-04-21 DIAGNOSIS — J9622 Acute and chronic respiratory failure with hypercapnia: Secondary | ICD-10-CM | POA: Diagnosis present

## 2016-04-21 DIAGNOSIS — Z8249 Family history of ischemic heart disease and other diseases of the circulatory system: Secondary | ICD-10-CM | POA: Diagnosis not present

## 2016-04-21 DIAGNOSIS — M8580 Other specified disorders of bone density and structure, unspecified site: Secondary | ICD-10-CM | POA: Diagnosis present

## 2016-04-21 DIAGNOSIS — J849 Interstitial pulmonary disease, unspecified: Secondary | ICD-10-CM | POA: Diagnosis present

## 2016-04-21 DIAGNOSIS — Z7189 Other specified counseling: Secondary | ICD-10-CM | POA: Diagnosis not present

## 2016-04-21 DIAGNOSIS — J9600 Acute respiratory failure, unspecified whether with hypoxia or hypercapnia: Secondary | ICD-10-CM

## 2016-04-21 DIAGNOSIS — Z66 Do not resuscitate: Secondary | ICD-10-CM | POA: Diagnosis not present

## 2016-04-21 DIAGNOSIS — R0902 Hypoxemia: Secondary | ICD-10-CM | POA: Diagnosis present

## 2016-04-21 DIAGNOSIS — I5043 Acute on chronic combined systolic (congestive) and diastolic (congestive) heart failure: Secondary | ICD-10-CM | POA: Diagnosis not present

## 2016-04-21 DIAGNOSIS — W19XXXA Unspecified fall, initial encounter: Secondary | ICD-10-CM | POA: Diagnosis present

## 2016-04-21 DIAGNOSIS — T45515A Adverse effect of anticoagulants, initial encounter: Secondary | ICD-10-CM | POA: Diagnosis present

## 2016-04-21 DIAGNOSIS — J189 Pneumonia, unspecified organism: Secondary | ICD-10-CM | POA: Diagnosis present

## 2016-04-21 DIAGNOSIS — E872 Acidosis: Secondary | ICD-10-CM | POA: Diagnosis present

## 2016-04-21 DIAGNOSIS — A413 Sepsis due to Hemophilus influenzae: Secondary | ICD-10-CM | POA: Diagnosis present

## 2016-04-21 DIAGNOSIS — J14 Pneumonia due to Hemophilus influenzae: Secondary | ICD-10-CM | POA: Diagnosis present

## 2016-04-21 DIAGNOSIS — Z79899 Other long term (current) drug therapy: Secondary | ICD-10-CM

## 2016-04-21 DIAGNOSIS — J9601 Acute respiratory failure with hypoxia: Secondary | ICD-10-CM | POA: Diagnosis not present

## 2016-04-21 DIAGNOSIS — N183 Chronic kidney disease, stage 3 (moderate): Secondary | ICD-10-CM | POA: Diagnosis present

## 2016-04-21 DIAGNOSIS — N170 Acute kidney failure with tubular necrosis: Secondary | ICD-10-CM | POA: Diagnosis not present

## 2016-04-21 DIAGNOSIS — I480 Paroxysmal atrial fibrillation: Secondary | ICD-10-CM | POA: Diagnosis present

## 2016-04-21 DIAGNOSIS — Z515 Encounter for palliative care: Secondary | ICD-10-CM | POA: Diagnosis present

## 2016-04-21 DIAGNOSIS — E871 Hypo-osmolality and hyponatremia: Secondary | ICD-10-CM | POA: Diagnosis present

## 2016-04-21 DIAGNOSIS — J188 Other pneumonia, unspecified organism: Secondary | ICD-10-CM

## 2016-04-21 DIAGNOSIS — R7881 Bacteremia: Secondary | ICD-10-CM | POA: Diagnosis not present

## 2016-04-21 DIAGNOSIS — Z886 Allergy status to analgesic agent status: Secondary | ICD-10-CM

## 2016-04-21 DIAGNOSIS — R739 Hyperglycemia, unspecified: Secondary | ICD-10-CM | POA: Diagnosis present

## 2016-04-21 DIAGNOSIS — R748 Abnormal levels of other serum enzymes: Secondary | ICD-10-CM | POA: Diagnosis present

## 2016-04-21 LAB — CBC
HCT: 42.7 % (ref 36.0–46.0)
Hemoglobin: 14.4 g/dL (ref 12.0–15.0)
MCH: 32.1 pg (ref 26.0–34.0)
MCHC: 33.7 g/dL (ref 30.0–36.0)
MCV: 95.1 fL (ref 78.0–100.0)
PLATELETS: 349 10*3/uL (ref 150–400)
RBC: 4.49 MIL/uL (ref 3.87–5.11)
RDW: 14.3 % (ref 11.5–15.5)
WBC: 34.5 10*3/uL — AB (ref 4.0–10.5)

## 2016-04-21 LAB — CBC WITH DIFFERENTIAL/PLATELET
BASOS ABS: 0 10*3/uL (ref 0.0–0.1)
Basophils Relative: 0 %
EOS ABS: 0 10*3/uL (ref 0.0–0.7)
Eosinophils Relative: 0 %
HCT: 43.2 % (ref 36.0–46.0)
Hemoglobin: 14.5 g/dL (ref 12.0–15.0)
LYMPHS ABS: 0.9 10*3/uL (ref 0.7–4.0)
LYMPHS PCT: 3 %
MCH: 32 pg (ref 26.0–34.0)
MCHC: 33.6 g/dL (ref 30.0–36.0)
MCV: 95.4 fL (ref 78.0–100.0)
MONO ABS: 1.2 10*3/uL — AB (ref 0.1–1.0)
Monocytes Relative: 4 %
NEUTROS ABS: 27.8 10*3/uL — AB (ref 1.7–7.7)
Neutrophils Relative %: 93 %
PLATELETS: 352 10*3/uL (ref 150–400)
RBC: 4.53 MIL/uL (ref 3.87–5.11)
RDW: 14.2 % (ref 11.5–15.5)
WBC: 29.9 10*3/uL — ABNORMAL HIGH (ref 4.0–10.5)

## 2016-04-21 LAB — URINE MICROSCOPIC-ADD ON

## 2016-04-21 LAB — PROTIME-INR
INR: 2.59
Prothrombin Time: 28.2 seconds — ABNORMAL HIGH (ref 11.4–15.2)

## 2016-04-21 LAB — BLOOD CULTURE ID PANEL (REFLEXED)
Acinetobacter baumannii: NOT DETECTED
CANDIDA KRUSEI: NOT DETECTED
Candida albicans: NOT DETECTED
Candida glabrata: NOT DETECTED
Candida parapsilosis: NOT DETECTED
Candida tropicalis: NOT DETECTED
Enterobacter cloacae complex: NOT DETECTED
Enterobacteriaceae species: NOT DETECTED
Enterococcus species: NOT DETECTED
Escherichia coli: NOT DETECTED
HAEMOPHILUS INFLUENZAE: DETECTED — AB
Klebsiella oxytoca: NOT DETECTED
Klebsiella pneumoniae: NOT DETECTED
Listeria monocytogenes: NOT DETECTED
NEISSERIA MENINGITIDIS: NOT DETECTED
PROTEUS SPECIES: NOT DETECTED
PSEUDOMONAS AERUGINOSA: NOT DETECTED
SERRATIA MARCESCENS: NOT DETECTED
STAPHYLOCOCCUS AUREUS BCID: NOT DETECTED
STAPHYLOCOCCUS SPECIES: NOT DETECTED
STREPTOCOCCUS PNEUMONIAE: NOT DETECTED
STREPTOCOCCUS SPECIES: NOT DETECTED
Streptococcus agalactiae: NOT DETECTED
Streptococcus pyogenes: NOT DETECTED

## 2016-04-21 LAB — URINALYSIS, ROUTINE W REFLEX MICROSCOPIC
Bilirubin Urine: NEGATIVE
Glucose, UA: NEGATIVE mg/dL
Ketones, ur: NEGATIVE mg/dL
Leukocytes, UA: NEGATIVE
NITRITE: NEGATIVE
PH: 5 (ref 5.0–8.0)
Protein, ur: NEGATIVE mg/dL
SPECIFIC GRAVITY, URINE: 1.012 (ref 1.005–1.030)

## 2016-04-21 LAB — TROPONIN I
TROPONIN I: 0.04 ng/mL — AB (ref <0.03)
Troponin I: 0.3 ng/mL (ref <0.03)
Troponin I: 0.05 ng/mL (ref <0.03)

## 2016-04-21 LAB — I-STAT ARTERIAL BLOOD GAS, ED
ACID-BASE DEFICIT: 6 mmol/L — AB (ref 0.0–2.0)
BICARBONATE: 23.8 mmol/L (ref 20.0–28.0)
O2 Saturation: 98 %
PO2 ART: 143 mmHg — AB (ref 83.0–108.0)
Patient temperature: 98.6
TCO2: 26 mmol/L (ref 0–100)
pCO2 arterial: 63.8 mmHg — ABNORMAL HIGH (ref 32.0–48.0)
pH, Arterial: 7.179 — CL (ref 7.350–7.450)

## 2016-04-21 LAB — BLOOD GAS, ARTERIAL
Acid-base deficit: 3.6 mmol/L — ABNORMAL HIGH (ref 0.0–2.0)
Bicarbonate: 22 mmol/L (ref 20.0–28.0)
DRAWN BY: 44135
FIO2: 70
MECHVT: 370 mL
O2 SAT: 92.5 %
PATIENT TEMPERATURE: 98.6
PCO2 ART: 47.4 mmHg (ref 32.0–48.0)
PEEP: 5 cmH2O
PH ART: 7.288 — AB (ref 7.350–7.450)
RATE: 24 resp/min
pO2, Arterial: 70.2 mmHg — ABNORMAL LOW (ref 83.0–108.0)

## 2016-04-21 LAB — COMPREHENSIVE METABOLIC PANEL
ALBUMIN: 3.2 g/dL — AB (ref 3.5–5.0)
ALK PHOS: 113 U/L (ref 38–126)
ALT: 15 U/L (ref 14–54)
ANION GAP: 14 (ref 5–15)
AST: 38 U/L (ref 15–41)
BILIRUBIN TOTAL: 1.8 mg/dL — AB (ref 0.3–1.2)
BUN: 19 mg/dL (ref 6–20)
CALCIUM: 9.3 mg/dL (ref 8.9–10.3)
CO2: 24 mmol/L (ref 22–32)
CREATININE: 1.37 mg/dL — AB (ref 0.44–1.00)
Chloride: 90 mmol/L — ABNORMAL LOW (ref 101–111)
GFR calc Af Amer: 39 mL/min — ABNORMAL LOW (ref >60)
GFR calc non Af Amer: 34 mL/min — ABNORMAL LOW (ref >60)
GLUCOSE: 158 mg/dL — AB (ref 65–99)
Potassium: 4.8 mmol/L (ref 3.5–5.1)
Sodium: 128 mmol/L — ABNORMAL LOW (ref 135–145)
TOTAL PROTEIN: 7.2 g/dL (ref 6.5–8.1)

## 2016-04-21 LAB — BRAIN NATRIURETIC PEPTIDE: B Natriuretic Peptide: 1114.5 pg/mL — ABNORMAL HIGH (ref 0.0–100.0)

## 2016-04-21 LAB — I-STAT CG4 LACTIC ACID, ED
Lactic Acid, Venous: 6.35 mmol/L (ref 0.5–1.9)
Lactic Acid, Venous: 3.08 mmol/L (ref 0.5–1.9)

## 2016-04-21 LAB — BASIC METABOLIC PANEL
Anion gap: 12 (ref 5–15)
BUN: 20 mg/dL (ref 6–20)
CO2: 20 mmol/L — ABNORMAL LOW (ref 22–32)
CREATININE: 1.09 mg/dL — AB (ref 0.44–1.00)
Calcium: 8.3 mg/dL — ABNORMAL LOW (ref 8.9–10.3)
Chloride: 94 mmol/L — ABNORMAL LOW (ref 101–111)
GFR, EST AFRICAN AMERICAN: 51 mL/min — AB (ref >60)
GFR, EST NON AFRICAN AMERICAN: 44 mL/min — AB (ref >60)
Glucose, Bld: 231 mg/dL — ABNORMAL HIGH (ref 65–99)
POTASSIUM: 4.8 mmol/L (ref 3.5–5.1)
SODIUM: 126 mmol/L — AB (ref 135–145)

## 2016-04-21 LAB — MRSA PCR SCREENING: MRSA BY PCR: NEGATIVE

## 2016-04-21 LAB — APTT
APTT: 55 s — AB (ref 24–36)
aPTT: 42 seconds — ABNORMAL HIGH (ref 24–36)

## 2016-04-21 LAB — LACTIC ACID, PLASMA: LACTIC ACID, VENOUS: 2.9 mmol/L — AB (ref 0.5–1.9)

## 2016-04-21 LAB — MAGNESIUM: MAGNESIUM: 1.6 mg/dL — AB (ref 1.7–2.4)

## 2016-04-21 LAB — PHOSPHORUS: Phosphorus: 4.4 mg/dL (ref 2.5–4.6)

## 2016-04-21 LAB — HEPARIN LEVEL (UNFRACTIONATED)

## 2016-04-21 MED ORDER — METOPROLOL TARTRATE 5 MG/5ML IV SOLN
5.0000 mg | Freq: Once | INTRAVENOUS | Status: AC
  Administered 2016-04-21: 5 mg via INTRAVENOUS
  Filled 2016-04-21: qty 5

## 2016-04-21 MED ORDER — DILTIAZEM HCL 100 MG IV SOLR
5.0000 mg/h | INTRAVENOUS | Status: DC
  Administered 2016-04-21: 5 mg/h via INTRAVENOUS
  Filled 2016-04-21: qty 100

## 2016-04-21 MED ORDER — CHLORHEXIDINE GLUCONATE 0.12% ORAL RINSE (MEDLINE KIT)
15.0000 mL | Freq: Two times a day (BID) | OROMUCOSAL | Status: DC
  Administered 2016-04-21 – 2016-04-22 (×4): 15 mL via OROMUCOSAL

## 2016-04-21 MED ORDER — ETOMIDATE 2 MG/ML IV SOLN
INTRAVENOUS | Status: DC | PRN
  Administered 2016-04-21: 10 mg via INTRAVENOUS

## 2016-04-21 MED ORDER — SODIUM CHLORIDE 0.9% FLUSH
10.0000 mL | Freq: Two times a day (BID) | INTRAVENOUS | Status: DC
  Administered 2016-04-21 – 2016-04-22 (×4): 10 mL

## 2016-04-21 MED ORDER — DEXTROSE 5 % IV SOLN
2.0000 g | Freq: Once | INTRAVENOUS | Status: AC
  Administered 2016-04-21: 2 g via INTRAVENOUS
  Filled 2016-04-21: qty 2

## 2016-04-21 MED ORDER — FENTANYL 2500MCG IN NS 250ML (10MCG/ML) PREMIX INFUSION
50.0000 ug/h | INTRAVENOUS | Status: DC
Start: 2016-04-21 — End: 2016-04-21

## 2016-04-21 MED ORDER — SODIUM CHLORIDE 0.9 % IV SOLN
250.0000 mL | INTRAVENOUS | Status: DC | PRN
Start: 2016-04-21 — End: 2016-04-23

## 2016-04-21 MED ORDER — MAGNESIUM SULFATE 2 GM/50ML IV SOLN
2.0000 g | Freq: Once | INTRAVENOUS | Status: AC
  Administered 2016-04-21: 2 g via INTRAVENOUS
  Filled 2016-04-21: qty 50

## 2016-04-21 MED ORDER — DILTIAZEM LOAD VIA INFUSION
10.0000 mg | Freq: Once | INTRAVENOUS | Status: AC
  Administered 2016-04-21: 10 mg via INTRAVENOUS

## 2016-04-21 MED ORDER — HEPARIN (PORCINE) IN NACL 100-0.45 UNIT/ML-% IJ SOLN
1350.0000 [IU]/h | INTRAMUSCULAR | Status: DC
  Administered 2016-04-21: 700 [IU]/h via INTRAVENOUS
  Administered 2016-04-22: 950 [IU]/h via INTRAVENOUS
  Filled 2016-04-21 (×2): qty 250

## 2016-04-21 MED ORDER — HEPARIN (PORCINE) IN NACL 100-0.45 UNIT/ML-% IJ SOLN: 800.0000 [IU]/h | INTRAMUSCULAR | Status: DC

## 2016-04-21 MED ORDER — FUROSEMIDE 10 MG/ML IJ SOLN
40.0000 mg | Freq: Once | INTRAMUSCULAR | Status: AC
  Administered 2016-04-21: 40 mg via INTRAVENOUS
  Filled 2016-04-21: qty 4

## 2016-04-21 MED ORDER — VANCOMYCIN HCL IN DEXTROSE 1-5 GM/200ML-% IV SOLN
1000.0000 mg | Freq: Once | INTRAVENOUS | Status: AC
  Administered 2016-04-21: 1000 mg via INTRAVENOUS
  Filled 2016-04-21: qty 200

## 2016-04-21 MED ORDER — DILTIAZEM HCL-DEXTROSE 100-5 MG/100ML-% IV SOLN (PREMIX)
5.0000 mg/h | INTRAVENOUS | Status: DC
  Administered 2016-04-21: 10 mg/h via INTRAVENOUS
  Filled 2016-04-21: qty 100

## 2016-04-21 MED ORDER — FENTANYL CITRATE (PF) 100 MCG/2ML IJ SOLN
50.0000 ug | Freq: Once | INTRAMUSCULAR | Status: DC
  Filled 2016-04-21: qty 2

## 2016-04-21 MED ORDER — VANCOMYCIN HCL IN DEXTROSE 750-5 MG/150ML-% IV SOLN
750.0000 mg | INTRAVENOUS | Status: DC
  Filled 2016-04-21: qty 150

## 2016-04-21 MED ORDER — METOPROLOL TARTRATE 5 MG/5ML IV SOLN
10.0000 mg | Freq: Once | INTRAVENOUS | Status: AC
  Administered 2016-04-21: 10 mg via INTRAVENOUS
  Filled 2016-04-21: qty 10

## 2016-04-21 MED ORDER — AMIODARONE LOAD VIA INFUSION
150.0000 mg | Freq: Once | INTRAVENOUS | Status: AC
  Administered 2016-04-21: 150 mg via INTRAVENOUS
  Filled 2016-04-21: qty 83.34

## 2016-04-21 MED ORDER — METOPROLOL TARTRATE 5 MG/5ML IV SOLN
5.0000 mg | INTRAVENOUS | Status: AC | PRN
  Administered 2016-04-21 (×2): 5 mg via INTRAVENOUS
  Filled 2016-04-21 (×2): qty 5

## 2016-04-21 MED ORDER — ROCURONIUM BROMIDE 50 MG/5ML IV SOLN
INTRAVENOUS | Status: DC | PRN
  Administered 2016-04-21: 70 mg via INTRAVENOUS

## 2016-04-21 MED ORDER — FENTANYL CITRATE (PF) 100 MCG/2ML IJ SOLN
INTRAMUSCULAR | Status: AC
  Filled 2016-04-21: qty 2

## 2016-04-21 MED ORDER — SODIUM CHLORIDE 0.9% FLUSH: 10.0000 mL | INTRAVENOUS | Status: DC | PRN

## 2016-04-21 MED ORDER — AMIODARONE HCL IN DEXTROSE 360-4.14 MG/200ML-% IV SOLN
60.0000 mg/h | INTRAVENOUS | Status: AC
  Administered 2016-04-21: 60 mg/h via INTRAVENOUS
  Filled 2016-04-21 (×3): qty 200

## 2016-04-21 MED ORDER — SODIUM CHLORIDE 0.9% FLUSH
3.0000 mL | INTRAVENOUS | Status: DC | PRN
Start: 2016-04-21 — End: 2016-04-23

## 2016-04-21 MED ORDER — FENTANYL 2500MCG IN NS 250ML (10MCG/ML) PREMIX INFUSION
25.0000 ug/h | INTRAVENOUS | Status: DC
  Administered 2016-04-21: 50 ug/h via INTRAVENOUS
  Administered 2016-04-21: 150 ug/h via INTRAVENOUS
  Filled 2016-04-21: qty 250

## 2016-04-21 MED ORDER — SODIUM CHLORIDE 0.9% FLUSH
3.0000 mL | Freq: Two times a day (BID) | INTRAVENOUS | Status: DC
  Administered 2016-04-21 – 2016-04-22 (×4): 3 mL via INTRAVENOUS

## 2016-04-21 MED ORDER — DEXTROSE 5 % IV SOLN
1.0000 g | INTRAVENOUS | Status: DC
  Filled 2016-04-21: qty 1

## 2016-04-21 MED ORDER — FENTANYL BOLUS VIA INFUSION
25.0000 ug | INTRAVENOUS | Status: DC | PRN
Start: 2016-04-21 — End: 2016-04-21
  Filled 2016-04-21: qty 25

## 2016-04-21 MED ORDER — ORAL CARE MOUTH RINSE
15.0000 mL | OROMUCOSAL | Status: DC
  Administered 2016-04-21 (×4): 15 mL via OROMUCOSAL

## 2016-04-21 MED ORDER — FAMOTIDINE IN NACL 20-0.9 MG/50ML-% IV SOLN
20.0000 mg | Freq: Two times a day (BID) | INTRAVENOUS | Status: DC
  Administered 2016-04-21 (×2): 20 mg via INTRAVENOUS
  Filled 2016-04-21 (×2): qty 50

## 2016-04-21 MED ORDER — MIDAZOLAM HCL 2 MG/2ML IJ SOLN
INTRAMUSCULAR | Status: AC
  Filled 2016-04-21: qty 2

## 2016-04-21 MED ORDER — FUROSEMIDE 10 MG/ML IJ SOLN
40.0000 mg | Freq: Two times a day (BID) | INTRAMUSCULAR | Status: DC
  Administered 2016-04-21 (×2): 40 mg via INTRAVENOUS
  Filled 2016-04-21 (×2): qty 4

## 2016-04-21 MED ORDER — SODIUM CHLORIDE 0.9 % IV SOLN: 250.0000 mL | INTRAVENOUS | Status: DC | PRN

## 2016-04-21 MED ORDER — AMIODARONE HCL IN DEXTROSE 360-4.14 MG/200ML-% IV SOLN
30.0000 mg/h | INTRAVENOUS | Status: DC
  Administered 2016-04-21: 30 mg/h via INTRAVENOUS
  Filled 2016-04-21: qty 200

## 2016-04-21 MED ORDER — ORAL CARE MOUTH RINSE
15.0000 mL | Freq: Four times a day (QID) | OROMUCOSAL | Status: DC
  Administered 2016-04-22 (×4): 15 mL via OROMUCOSAL

## 2016-04-21 MED ORDER — DEXTROSE 5 % IV SOLN
2.0000 g | INTRAVENOUS | Status: DC
  Administered 2016-04-21 – 2016-04-22 (×2): 2 g via INTRAVENOUS
  Filled 2016-04-21 (×3): qty 2

## 2016-04-21 NOTE — Progress Notes (Signed)
ANTICOAGULATION CONSULT NOTE - Initial Consult  Pharmacy Consult for Heparin Indication: atrial fibrillation  Allergies  Allergen Reactions  . Aspirin Other (See Comments)    Cannot have due to Xarelto and previous bleed  . Ciprofloxacin Other (See Comments) and Rash    Cant be given with her multaq    Patient Measurements: Height: 5' (152.4 cm) Weight: 132 lb 4.4 oz (60 kg) IBW/kg (Calculated) : 45.5  Vital Signs: Temp: 97.6 F (36.4 C) (11/24 0600) Temp Source: Axillary (11/24 0600) BP: 78/54 (11/24 0700) Pulse Rate: 50 (11/24 0700)  Labs:  Recent Labs  04/18/2016 0225 04/07/2016 0525  HGB 14.5 14.4  HCT 43.2 42.7  PLT 352 349  CREATININE 1.37* 1.09*  TROPONINI  --  0.30*    Estimated Creatinine Clearance: 29.4 mL/min (by C-G formula based on SCr of 1.09 mg/dL (H)).   Medical History: Past Medical History:  Diagnosis Date  . Chronic diastolic CHF (congestive heart failure) ([place])   . CKD (chronic kidney disease), stage III   . Diverticular disease   . Hypertension   . Interstitial lung disease ([place])    a. Noted on CT scan 02/2011 before initiation of any antiarrhythmic meds.  . Osteopenia   . Persistent atrial fibrillation ([place])   . Seborrheic keratosis 06/2014   of hand.     Medications:  No current facility-administered medications on file prior to encounter.    Current Outpatient Prescriptions on File Prior to Encounter  Medication Sig Dispense Refill  . diltiazem (CARDIZEM CD) 240 MG 24 hr capsule Take 1 capsule (240 mg total) by mouth at bedtime. 30 capsule 0  . diltiazem (CARDIZEM CD) 240 MG 24 hr capsule Take 240 mg by mouth 2 (two) times daily.    Marland Kitchen docusate sodium (COLACE) 100 MG capsule Take 1 capsule (100 mg total) by mouth 2 (two) times daily as needed for mild constipation. (Patient taking differently: Take 100 mg by mouth 3 (three) times daily as needed for mild constipation. ) 60 capsule 0  . lisinopril (PRINIVIL,ZESTRIL) 5 MG tablet Take 0.5  tablets (2.5 mg total) by mouth daily. 90 tablet 3  . metoprolol succinate (TOPROL-XL) 25 MG 24 hr tablet Take 75 mg by mouth daily.    . metoprolol succinate (TOPROL-XL) 50 MG 24 hr tablet Take 1 tablet (50 mg total) by mouth at bedtime. Take with or immediately following a meal. 30 tablet 0  . nitroGLYCERIN (NITROSTAT) 0.4 MG SL tablet Place 1 tablet (0.4 mg total) under the tongue every 5 (five) minutes as needed. For chest pain 25 tablet 8  . oxyCODONE-acetaminophen (PERCOCET/ROXICET) 5-325 MG tablet Take 1 tablet by mouth every 6 (six) hours as needed for severe pain. 15 tablet 0  . Rivaroxaban (XARELTO) 15 MG TABS tablet Take 1 tablet (15 mg total) by mouth daily. (Patient taking differently: Take 15 mg by mouth every evening. ) 90 tablet 3  . sertraline (ZOLOFT) 25 MG tablet Take 1 tablet (25 mg total) by mouth daily. 30 tablet 1  . traMADol (ULTRAM) 50 MG tablet Take 1 tablet (50 mg total) by mouth every 12 (twelve) hours as needed for severe pain. 20 tablet 0  . Vitamin D, Ergocalciferol, (DRISDOL) 50000 UNITS CAPS capsule Take 50,000 Units by mouth every 7 (seven) days. Wednesday    . [DISCONTINUED] digoxin (LANOXIN) 0.125 MG tablet Take 125 mcg by mouth daily.         Assessment: 82 y.o. female admitted with VDRF, h/o Afib and Xarelto  on hold, for heparin.   Patient intubated and family not present at time of medication history, so unclear when last dose of Xarelto taken; however, based on medication history taken 04/05/16, appears patient usually takes Xarelto in the evenings.   Goal of Therapy:  APTT 66-102 while Xarelto affecting anti-Xa levels Heparin level 0.3-0.7 units/ml Monitor platelets by anticoagulation protocol: Yes   Plan:  Check baseline labs.   Start heparin 800 units/hr ~24 hours after last dose of Xarelto APTT 8 hours after starting heparin  Caryl Pina 04/03/2016,7:27 AM

## 2016-04-21 NOTE — Progress Notes (Signed)
PHARMACY - PHYSICIAN COMMUNICATION CRITICAL VALUE ALERT - BLOOD CULTURE IDENTIFICATION (BCID)  Results for orders placed or performed during the hospital encounter of 04/04/2016  Blood Culture ID Panel (Reflexed) (Collected: 04/07/2016  2:26 AM)  Result Value Ref Range   Enterococcus species NOT DETECTED NOT DETECTED   Listeria monocytogenes NOT DETECTED NOT DETECTED   Staphylococcus species NOT DETECTED NOT DETECTED   Staphylococcus aureus NOT DETECTED NOT DETECTED   Streptococcus species NOT DETECTED NOT DETECTED   Streptococcus agalactiae NOT DETECTED NOT DETECTED   Streptococcus pneumoniae NOT DETECTED NOT DETECTED   Streptococcus pyogenes NOT DETECTED NOT DETECTED   Acinetobacter baumannii NOT DETECTED NOT DETECTED   Enterobacteriaceae species NOT DETECTED NOT DETECTED   Enterobacter cloacae complex NOT DETECTED NOT DETECTED   Escherichia coli NOT DETECTED NOT DETECTED   Klebsiella oxytoca NOT DETECTED NOT DETECTED   Klebsiella pneumoniae NOT DETECTED NOT DETECTED   Proteus species NOT DETECTED NOT DETECTED   Serratia marcescens NOT DETECTED NOT DETECTED   Haemophilus influenzae DETECTED (A) NOT DETECTED   Neisseria meningitidis NOT DETECTED NOT DETECTED   Pseudomonas aeruginosa NOT DETECTED NOT DETECTED   Candida albicans NOT DETECTED NOT DETECTED   Candida glabrata NOT DETECTED NOT DETECTED   Candida krusei NOT DETECTED NOT DETECTED   Candida parapsilosis NOT DETECTED NOT DETECTED   Candida tropicalis NOT DETECTED NOT DETECTED    Name of physician (or Provider) Contacted: Dr. Jimmy Footman  Changes to prescribed antibiotics required: Change vancomycin and cefepime to ceftriaxone 2g IV q24  Deboraha Sprang 04/16/2016  9:40 PM

## 2016-04-21 NOTE — Code Documentation (Signed)
Dr Claudine Mouton attempting intubation now

## 2016-04-21 NOTE — Progress Notes (Signed)
ANTICOAGULATION CONSULT NOTE - Follow Up Consult  Pharmacy Consult for Heparin Indication: atrial fibrillation  Allergies  Allergen Reactions  . Aspirin Other (See Comments)    Cannot have due to Xarelto and previous bleed  . Ciprofloxacin Other (See Comments) and Rash    Cant be given with her multaq    Patient Measurements: Height: 5' (152.4 cm) Weight: 132 lb 4.4 oz (60 kg) IBW/kg (Calculated) : 45.5  Vital Signs: Temp: 98.2 F (36.8 C) (11/24 1200) Temp Source: Axillary (11/24 1200) BP: 124/95 (11/24 1500) Pulse Rate: 146 (11/24 1500)  Labs:  Recent Labs  04/14/2016 0225 04/11/2016 0525 04/10/2016 1033  HGB 14.5 14.4  --   HCT 43.2 42.7  --   PLT 352 349  --   APTT  --   --  42*  LABPROT  --   --  28.2*  INR  --   --  2.59  HEPARINUNFRC  --   --  >2.20*  CREATININE 1.37* 1.09*  --   TROPONINI  --  0.30* 0.04*    Estimated Creatinine Clearance: 29.4 mL/min (by C-G formula based on SCr of 1.09 mg/dL (H)).   Medical History: Past Medical History:  Diagnosis Date  . Chronic diastolic CHF (congestive heart failure) ([place])   . CKD (chronic kidney disease), stage III   . Diverticular disease   . Hypertension   . Interstitial lung disease ([place])    a. Noted on CT scan 02/2011 before initiation of any antiarrhythmic meds.  . Osteopenia   . Persistent atrial fibrillation ([place])   . Seborrheic keratosis 06/2014   of hand.     Medications:  No current facility-administered medications on file prior to encounter.    Current Outpatient Prescriptions on File Prior to Encounter  Medication Sig Dispense Refill  . diltiazem (CARDIZEM CD) 240 MG 24 hr capsule Take 1 capsule (240 mg total) by mouth at bedtime. (Patient taking differently: Take 240 mg by mouth 2 (two) times daily. ) 30 capsule 0  . lisinopril (PRINIVIL,ZESTRIL) 5 MG tablet Take 0.5 tablets (2.5 mg total) by mouth daily. 90 tablet 3  . metoprolol succinate (TOPROL-XL) 50 MG 24 hr tablet Take 1 tablet (50 mg  total) by mouth at bedtime. Take with or immediately following a meal. (Patient taking differently: Take 75 mg by mouth at bedtime. Take with or immediately following a meal.) 30 tablet 0  . nitroGLYCERIN (NITROSTAT) 0.4 MG SL tablet Place 1 tablet (0.4 mg total) under the tongue every 5 (five) minutes as needed. For chest pain 25 tablet 8  . Rivaroxaban (XARELTO) 15 MG TABS tablet Take 1 tablet (15 mg total) by mouth daily. (Patient taking differently: Take 15 mg by mouth every evening. ) 90 tablet 3  . docusate sodium (COLACE) 100 MG capsule Take 1 capsule (100 mg total) by mouth 2 (two) times daily as needed for mild constipation. (Patient not taking: Reported on 04/08/2016) 60 capsule 0  . oxyCODONE-acetaminophen (PERCOCET/ROXICET) 5-325 MG tablet Take 1 tablet by mouth every 6 (six) hours as needed for severe pain. (Patient not taking: Reported on 04/16/2016) 15 tablet 0  . sertraline (ZOLOFT) 25 MG tablet Take 1 tablet (25 mg total) by mouth daily. (Patient not taking: Reported on 04/20/2016) 30 tablet 1  . traMADol (ULTRAM) 50 MG tablet Take 1 tablet (50 mg total) by mouth every 12 (twelve) hours as needed for severe pain. (Patient not taking: Reported on 04/08/2016) 20 tablet 0  . Vitamin D, Ergocalciferol, (DRISDOL)  50000 UNITS CAPS capsule Take 50,000 Units by mouth every 7 (seven) days. Wednesday    . [DISCONTINUED] digoxin (LANOXIN) 0.125 MG tablet Take 125 mcg by mouth daily.      . [DISCONTINUED] diltiazem (CARDIZEM CD) 240 MG 24 hr capsule Take 240 mg by mouth 2 (two) times daily.    . [DISCONTINUED] metoprolol succinate (TOPROL-XL) 25 MG 24 hr tablet Take 75 mg by mouth daily.       Assessment: 82 y.o. female admitted with VDRF, h/o Afib on Xarelto PTA for [facility] - now on hold. Patient intubated and family not present at time of medication history, so unclear when last dose of Xarelto taken.  BL HL > 2.2, INR 2.5 - false elevated in setting rivaroxaban pta.  APTT 42sec will use this to  guide heparin dosing until aPTT and HL correlate.     Goal of Therapy:  APTT 66-102 while Xarelto affecting anti-Xa levels Heparin level 0.3-0.7 units/ml Monitor platelets by anticoagulation protocol: Yes   Plan:  Start heparin 700 units/hr ~24 hours after suspected last dose of Xarelto this evening APTT 6 hours after starting heparin  Bonnita Nasuti Pharm.D. CPP, BCPS Clinical Pharmacist [phone] 04/17/2016 3:12 PM

## 2016-04-21 NOTE — Progress Notes (Signed)
ANTICOAGULATION CONSULT NOTE - Follow Up Consult  Pharmacy Consult for Heparin (Xarelto on hold) Indication: atrial fibrillation  Allergies  Allergen Reactions  . Aspirin Other (See Comments)    Cannot have due to Xarelto and previous bleed  . Ciprofloxacin Other (See Comments) and Rash    Cant be given with her multaq    Patient Measurements: Height: 5' (152.4 cm) Weight: 132 lb 4.4 oz (60 kg) IBW/kg (Calculated) : 45.5  Vital Signs: Temp: 97.6 F (36.4 C) (11/24 2000) Temp Source: Oral (11/24 2000) BP: 115/86 (11/24 2300) Pulse Rate: 145 (11/24 2300)  Labs:  Recent Labs  04/12/2016 0225 04/19/2016 0525 04/08/2016 1033 04/18/2016 1722 04/25/2016 2200  HGB 14.5 14.4  --   --   --   HCT 43.2 42.7  --   --   --   PLT 352 349  --   --   --   APTT  --   --  42*  --  55*  LABPROT  --   --  28.2*  --   --   INR  --   --  2.59  --   --   HEPARINUNFRC  --   --  >2.20*  --   --   CREATININE 1.37* 1.09*  --   --   --   TROPONINI  --  0.30* 0.04* 0.05*  --     Estimated Creatinine Clearance: 29.4 mL/min (by C-G formula based on SCr of 1.09 mg/dL (H)).  Assessment: Heparin for afib while xarelto on hold, aPTT sub-therapeutic tonight, using aPTT to dose for now given Xarelto influence on heparin levels. No issues per RN.   Goal of Therapy:  Heparin level 0.3-0.7 units/ml aPTT 66-102 seconds Monitor platelets by anticoagulation protocol: Yes   Plan:  -Inc heparin to 800 units/hr -0800 aPTT/HL  Narda Bonds 04/09/2016,11:40 PM

## 2016-04-21 NOTE — ED Provider Notes (Signed)
[place] DEPT Provider Note   CSN: [id number] Arrival date & time: 04/14/2016  0149 By signing my name below, I, Georgette Shell, attest that this documentation has been prepared under the direction and in the presence of Everlene Balls, MD. Electronically Signed: Georgette Shell, ED Scribe. 04/19/2016. 2:14 AM.  History   Chief Complaint Chief Complaint  Patient presents with  . Chest Pain   HPI Comments: Allison Sellers is a 82 y.o. female with h/o CHF, CKD, HTN, interstitial lung disease, A-fib on Xarelto, and HTN who presents to the Emergency Department by EMS complaining of dull, aching, centralized chest pain onset one hour ago with associated shortness of breath. Per pt, she has had an intermittent, productive cough for more than a week. She did not try any OTC medications PTA. Pt was given 10mg  Albuterol and 0.5mg  Atrovent by EMS. Pt is regularly on 2L oxygen at home. No known sick contacts with similar symptoms. Denies h/o DVT/PE. Denies h/o asthma or COPD. Pt further denies fever, chills, or any other associated symptoms.   The history is provided by the patient. No language interpreter was used.    Past Medical History:  Diagnosis Date  . Chronic diastolic CHF (congestive heart failure) ([place])   . CKD (chronic kidney disease), stage III   . Diverticular disease   . Hypertension   . Interstitial lung disease ([place])    a. Noted on CT scan 02/2011 before initiation of any antiarrhythmic meds.  . Osteopenia   . Persistent atrial fibrillation ([place])   . Seborrheic keratosis 06/2014   of hand.     Patient Active Problem List   Diagnosis Date Noted  . Chronic atrial fibrillation ([place]) 04/16/2016  . Depression 04/16/2016  . Compression fracture of body of thoracic vertebra (H[facility]) 04/06/2016  . Atrial fibrillation with rapid ventricular response ([place]) 04/05/2016  . Stage 3 chronic kidney disease   . Chronic diastolic CHF (congestive heart failure) ([place])   . PAF (paroxysmal atrial  fibrillation) ([place])   . Hyponatremia 11/16/2014  . Rectal bleeding 11/01/2014  . Hyperkalemia 11/01/2014  . GI bleed 11/01/2014  . Chronic anticoagulation 02/17/2013  . Diastolic dysfunction- grade 2 by echo Oct 2012 02/17/2013  . Persistent atrial fibrillation ([place]) 04/18/2011  . Essential hypertension 12/26/2007  . Disorder of bone and cartilage 12/26/2007    Past Surgical History:  Procedure Laterality Date  . 2D Echocardiogram  03/29/2011   EF 55-65%, normal  . CARDIOVERSION  04/18/2011   Procedure: CARDIOVERSION;  Surgeon: Leonie Man;  Location: [facility] OR;  Service: Cardiovascular;  Laterality: N/A;  . CARDIOVERSION N/A 11/20/2014   Procedure: CARDIOVERSION;  Surgeon: Jolaine Artist, MD;  Location: [facility];  Service: Cardiovascular;  Laterality: N/A;  . COLONOSCOPY N/A 11/03/2014   Procedure: COLONOSCOPY;  Surgeon: Ladene Artist, MD;  Location: [facility] ENDOSCOPY;  Service: Endoscopy;  Laterality: N/A;  . Lexiscan Myoview  04/14/2011   No scintigraphic evidence of inducible myocardial ischemia, EKG negative for ischemia, patient developed Atrial Fibrillation with rapid ventricular response during stress and persisted in the recovery period, abnormal myocardial perfusion study  . UMBILICAL HERNIA REPAIR  ? 2013    OB History    No data available       Home Medications    Prior to Admission medications   Medication Sig Start Date End Date Taking? Authorizing Provider  diltiazem (CARDIZEM CD) 240 MG 24 hr capsule Take 1 capsule (240 mg total) by mouth at bedtime. 04/07/16   Georgina Quint  Latif Sheikh, DO  diltiazem (CARDIZEM CD) 240 MG 24 hr capsule Take 240 mg by mouth 2 (two) times daily.    Historical Provider, MD  docusate sodium (COLACE) 100 MG capsule Take 1 capsule (100 mg total) by mouth 2 (two) times daily as needed for mild constipation. Patient taking differently: Take 100 mg by mouth 3 (three) times daily as needed for mild constipation.  04/07/16   Tracy, DO    lisinopril (PRINIVIL,ZESTRIL) 5 MG tablet Take 0.5 tablets (2.5 mg total) by mouth daily. 08/18/15   Sherran Needs, NP  metoprolol succinate (TOPROL-XL) 25 MG 24 hr tablet Take 75 mg by mouth daily.    Historical Provider, MD  metoprolol succinate (TOPROL-XL) 50 MG 24 hr tablet Take 1 tablet (50 mg total) by mouth at bedtime. Take with or immediately following a meal. 04/07/16   Alta, DO  nitroGLYCERIN (NITROSTAT) 0.4 MG SL tablet Place 1 tablet (0.4 mg total) under the tongue every 5 (five) minutes as needed. For chest pain 08/18/15   Sherran Needs, NP  oxyCODONE-acetaminophen (PERCOCET/ROXICET) 5-325 MG tablet Take 1 tablet by mouth every 6 (six) hours as needed for severe pain. 04/07/16   Omair Lise Auer, DO  Rivaroxaban (XARELTO) 15 MG TABS tablet Take 1 tablet (15 mg total) by mouth daily. Patient taking differently: Take 15 mg by mouth every evening.  05/26/15   Leonie Man, MD  sertraline (ZOLOFT) 25 MG tablet Take 1 tablet (25 mg total) by mouth daily. 04/14/16   Rosalita Chessman Chase, DO  traMADol (ULTRAM) 50 MG tablet Take 1 tablet (50 mg total) by mouth every 12 (twelve) hours as needed for severe pain. 04/07/16   Kerney Elbe, DO  Vitamin D, Ergocalciferol, (DRISDOL) 50000 UNITS CAPS capsule Take 50,000 Units by mouth every 7 (seven) days. Wednesday    Historical Provider, MD    Family History Family History  Problem Relation Age of Onset  . Heart failure Father     CHF  . Heart failure Mother     CHF  . Macular degeneration Mother   . Arthritis      Social History Social History  Substance Use Topics  . Smoking status: Never Smoker  . Smokeless tobacco: Never Used  . Alcohol use No     Allergies   Aspirin and Ciprofloxacin   Review of Systems Review of Systems  Constitutional: Negative for chills and fever.  Respiratory: Positive for cough and shortness of breath.   Cardiovascular: Positive for chest pain.  All other systems reviewed  and are negative.    Physical Exam Updated Vital Signs BP (!) 145/112   Pulse 108   Temp 97.8 F (36.6 C) (Oral)   Resp 18   Ht 5' (1.524 m)   Wt 141 lb (64 kg)   SpO2 98%   BMI 27.54 kg/m   Physical Exam  Constitutional: She is oriented to person, place, and time. She appears well-developed and well-nourished. She appears distressed. Face mask in place.  Non-re breather in place.  HENT:  Head: Normocephalic and atraumatic.  Nose: Nose normal.  Mouth/Throat: Oropharynx is clear and moist. No oropharyngeal exudate.  Eyes: Conjunctivae and EOM are normal. Pupils are equal, round, and reactive to light. No scleral icterus.  Neck: Normal range of motion. Neck supple. JVD present. No tracheal deviation present. No thyromegaly present.  Cardiovascular: Normal heart sounds.  An irregularly irregular rhythm present. Tachycardia present.  Exam reveals no  gallop and no friction rub.   No murmur heard. Pulmonary/Chest: Tachypnea noted. She is in respiratory distress. She has wheezes. She exhibits no tenderness.  Wheezing and crackles worse on the right. Increase use of accessory muscles. Increased work of breathing.  Abdominal: Soft. Bowel sounds are normal. She exhibits no distension and no mass. There is no tenderness. There is no rebound and no guarding.  Musculoskeletal: Normal range of motion. She exhibits edema. She exhibits no tenderness.  Trace BLE edema.  Lymphadenopathy:    She has no cervical adenopathy.  Neurological: She is alert and oriented to person, place, and time. No cranial nerve deficit. She exhibits normal muscle tone.  Skin: Skin is warm and dry. No rash noted. No erythema. No pallor.  Nursing note and vitals reviewed.    ED Treatments / Results  DIAGNOSTIC STUDIES: Oxygen Saturation is 90% on [place] 2L, normal by my interpretation.    COORDINATION OF CARE: 2:06 AM Discussed treatment plan with pt at bedside which includes breathing treatment and blood work and  pt agreed to plan.  Labs (all labs ordered are listed, but only abnormal results are displayed) Labs Reviewed  COMPREHENSIVE METABOLIC PANEL - Abnormal; Notable for the following:       Result Value   Sodium 128 (*)    Chloride 90 (*)    Glucose, Bld 158 (*)    Creatinine, Ser 1.37 (*)    Albumin 3.2 (*)    Total Bilirubin 1.8 (*)    GFR calc non Af Amer 34 (*)    GFR calc Af Amer 39 (*)    All other components within normal limits  CBC WITH DIFFERENTIAL/PLATELET - Abnormal; Notable for the following:    WBC 29.9 (*)    All other components within normal limits  I-STAT CG4 LACTIC ACID, ED - Abnormal; Notable for the following:    Lactic Acid, Venous 6.35 (*)    All other components within normal limits  CULTURE, BLOOD (ROUTINE X 2)  CULTURE, BLOOD (ROUTINE X 2)  URINE CULTURE  URINALYSIS, ROUTINE W REFLEX MICROSCOPIC (NOT AT [facility])  BRAIN NATRIURETIC PEPTIDE    EKG  EKG Interpretation None       Radiology Dg Chest Portable 1 View  Result Date: 04/18/2016 CLINICAL DATA:  Endotracheal tube placement.  Initial encounter. EXAM: PORTABLE CHEST 1 VIEW COMPARISON:  Chest radiograph performed earlier today at 2:21 a.m. FINDINGS: The patient's endotracheal tube is seen ending 1-2 cm above the carina. This could be retracted 1-2 cm. Bilateral airspace opacification raises concern for persistent pneumonia, perhaps slightly improved from the prior study. Underlying interstitial edema cannot be excluded. No pleural effusion or pneumothorax is seen. The cardiomediastinal silhouette is mildly enlarged. No acute osseous abnormalities are seen. External pacing pads are noted. IMPRESSION: 1. Endotracheal tube seen ending 1-2 cm above the carina. This could be retracted 1-2 cm. 2. Bilateral airspace opacification raises concern for persistent pneumonia, perhaps slightly improved from the prior study. Underlying interstitial edema cannot be excluded. 3. Mild cardiomegaly. Electronically Signed    By: Garald Balding M.D.   On: 04/09/2016 03:22   Dg Chest Port 1 View  Result Date: 04/14/2016 CLINICAL DATA:  Acute onset of generalized chest pain and shortness of breath. Hemoptysis. Atrial fibrillation. Initial encounter. EXAM: PORTABLE CHEST 1 VIEW COMPARISON:  Chest radiograph performed 04/05/2016 FINDINGS: Diffuse bilateral airspace opacification is noted, worse on the left, concerning for multifocal pneumonia. No pleural effusion or pneumothorax is seen. The cardiomediastinal silhouette is  mildly enlarged. No acute osseous abnormalities are seen. IMPRESSION: Diffuse bilateral airspace opacification, worse on the left, concerning for multifocal pneumonia. Mild cardiomegaly. Electronically Signed   By: Garald Balding M.D.   On: 04/16/2016 02:33    Procedures Procedures   Medications Ordered in ED Medications  vancomycin (VANCOCIN) IVPB 1000 mg/200 mL premix (1,000 mg Intravenous New Bag/Given 03/29/2016 0309)  fentaNYL 2542mcg in NS 253mL (96mcg/ml) infusion-PREMIX (50 mcg/hr Intravenous New Bag/Given 04/14/2016 0307)  etomidate (AMIDATE) injection (10 mg Intravenous Given 04/19/2016 0257)  rocuronium (ZEMURON) injection (70 mg Intravenous Given 04/17/2016 0258)  fentaNYL 2566mcg in NS 278mL (65mcg/ml) infusion-PREMIX (not administered)  ceFEPIme (MAXIPIME) 2 g in dextrose 5 % 50 mL IVPB (0 g Intravenous Stopped 04/11/2016 0313)  metoprolol (LOPRESSOR) injection 10 mg (10 mg Intravenous Given 04/19/2016 0318)     Initial Impression / Assessment and Plan / ED Course  I have reviewed the triage vital signs and the nursing notes.  Pertinent labs & imaging results that were available during my care of the patient were reviewed by me and considered in my medical decision making (see chart for details).  Clinical Course     Patient presents to the emergency department for chest pain or shortness of breath. History also suggests a recent illness. She is currently in A. fib with RVR and has  adventitious sounds in her lungs. I concern for pneumonia. Code sepsis was called. Patient given vancomycin and cefepime. Upon repeat evaluation, patient continues to deteriorate. She is now getting sleepy and her respiratory rate is decreasing. Patient will require intubation for further care. I discussed this at length with her 3 daughters who are all in agreeance. I spoke with critical care, Dr. Detterding who accepts the patient for further care.  Chest x-ray does reveal multifocal pneumonia. Lactate is 6.3, white blood cell count 19.9.     Angiocath insertion Performed by: Everlene Balls  Consent: Verbal consent obtained. Risks and benefits: risks, benefits and alternatives were discussed Time out: Immediately prior to procedure a "time out" was called to verify the correct patient, procedure, equipment, support staff and site/side marked as required.  Preparation: Patient was prepped and draped in the usual sterile fashion.  Vein Location: R AV  Ultrasound Guided  Gauge: 20G  Normal blood return and flush without difficulty Patient tolerance: Patient tolerated the procedure well with no immediate complications.     INTUBATION Performed by: Everlene Balls  Required items: required blood products, implants, devices, and special equipment available Patient identity confirmed: provided demographic data and hospital-assigned identification number Time out: Immediately prior to procedure a "time out" was called to verify the correct patient, procedure, equipment, support staff and site/side marked as required.  Indications: respiratory failure  Intubation method: Direct Laryngoscopy   Preoxygenation: 100% BVM  Sedatives: 10mg Etomidate Paralytic: 70mg  Rocuronium  Tube Size: 7.5 cuffed  Post-procedure assessment: chest rise and ETCO2 monitor Breath sounds: equal and absent over the epigastrium Tube secured with: ETT holder Chest x-ray interpreted by radiologist and  me.  Chest x-ray findings: endotracheal tube in appropriate position  Patient tolerated the procedure well with no immediate complications.     CRITICAL CARE Performed by: Everlene Balls   Total critical care time: 50 minutes  Critical care time was exclusive of separately billable procedures and treating other patients.  Critical care was necessary to treat or prevent imminent or life-threatening deterioration.  Critical care was time spent personally by me on the following activities: development of treatment plan with patient and/or surrogate  as well as nursing, discussions with consultants, evaluation of patient's response to treatment, examination of patient, obtaining history from patient or surrogate, ordering and performing treatments and interventions, ordering and review of laboratory studies, ordering and review of radiographic studies, pulse oximetry and re-evaluation of patient's condition.    Final Clinical Impressions(s) / ED Diagnoses   Final diagnoses:  Multifocal pneumonia  Hypoxia  Atrial fibrillation with RVR ([place])    New Prescriptions New Prescriptions   No medications on file     I personally performed the services described in this documentation, which was scribed in my presence. The recorded information has been reviewed and is accurate.       Everlene Balls, MD 04/10/2016 [phone]

## 2016-04-21 NOTE — H&P (Signed)
PULMONARY / CRITICAL CARE MEDICINE   Name: Allison Sellers MRN: [id number] DOB: Dec 29, 1926    ADMISSION DATE:  04/18/2016 CONSULTATION DATE:  04/19/2016  REFERRING MD:  Dr. Claudine Mouton  CHIEF COMPLAINT:  Shortness of breath  HISTORY OF PRESENT ILLNESS:   82 year old female with past medical history as below, which is significant for diastolic CHF, chronic kidney disease, hypertension, interstitial lung disease (no biopsy or formal workup), and atrial fibrillation. She was recently admitted on November 8 and diagnosed with vertebral fracture after fall. Hospital course, it was atrial fibrillation with rapid ventricular response responsive to Cardizem drip. Recommended for conservative management of vertebral fracture and she was discharged on increased dose of metoprolol to manage for fibrillation. Since that time. Initial been improving, however, did experience a nonproductive cough intermittently. 11/23 awaiting placement in her kidney and experienced generalized malaise which developed into chest pressure and shortness of breath. She presented to the emergency department with these complaints where she was found to be nature fibrillation with rapid ventricular response with pulse 180s to 190s which has been refractory to 20 mg of metoprolol IV. She also had adventitious lung sounds on ER exam and chest x-ray demonstrated or edema versus multifocal pneumonia superimposed on chronic interstitial changes. Due to her significant work of breathing and progressive alteration in mental status she was intubated. She did have a standing DO NOT RESUSCITATE order however family decided that she should be intubated at this time. She was started on broad-spectrum antibiotics and [place] was asked to admit.  PAST MEDICAL HISTORY :  She  has a past medical history of Chronic diastolic CHF (congestive heart failure) ([place]); CKD (chronic kidney disease), stage III; Diverticular disease; Hypertension; Interstitial lung disease  ([place]); Osteopenia; Persistent atrial fibrillation ([place]); and Seborrheic keratosis (06/2014).  PAST SURGICAL HISTORY: She  has a past surgical history that includes Umbilical hernia repair (? 2013); Cardioversion (04/18/2011); Lexiscan Myoview (04/14/2011); 2D Echocardiogram (03/29/2011); Colonoscopy (N/A, 11/03/2014); and Cardioversion (N/A, 11/20/2014).  Allergies  Allergen Reactions  . Aspirin Other (See Comments)    Cannot have due to Xarelto and previous bleed  . Ciprofloxacin Other (See Comments) and Rash    Cant be given with her multaq    No current facility-administered medications on file prior to encounter.    Current Outpatient Prescriptions on File Prior to Encounter  Medication Sig  . diltiazem (CARDIZEM CD) 240 MG 24 hr capsule Take 1 capsule (240 mg total) by mouth at bedtime.  Marland Kitchen diltiazem (CARDIZEM CD) 240 MG 24 hr capsule Take 240 mg by mouth 2 (two) times daily.  Marland Kitchen docusate sodium (COLACE) 100 MG capsule Take 1 capsule (100 mg total) by mouth 2 (two) times daily as needed for mild constipation. (Patient taking differently: Take 100 mg by mouth 3 (three) times daily as needed for mild constipation. )  . lisinopril (PRINIVIL,ZESTRIL) 5 MG tablet Take 0.5 tablets (2.5 mg total) by mouth daily.  . metoprolol succinate (TOPROL-XL) 25 MG 24 hr tablet Take 75 mg by mouth daily.  . metoprolol succinate (TOPROL-XL) 50 MG 24 hr tablet Take 1 tablet (50 mg total) by mouth at bedtime. Take with or immediately following a meal.  . nitroGLYCERIN (NITROSTAT) 0.4 MG SL tablet Place 1 tablet (0.4 mg total) under the tongue every 5 (five) minutes as needed. For chest pain  . oxyCODONE-acetaminophen (PERCOCET/ROXICET) 5-325 MG tablet Take 1 tablet by mouth every 6 (six) hours as needed for severe pain.  . Rivaroxaban (XARELTO) 15 MG TABS tablet  Take 1 tablet (15 mg total) by mouth daily. (Patient taking differently: Take 15 mg by mouth every evening. )  . sertraline (ZOLOFT) 25 MG tablet Take 1  tablet (25 mg total) by mouth daily.  . traMADol (ULTRAM) 50 MG tablet Take 1 tablet (50 mg total) by mouth every 12 (twelve) hours as needed for severe pain.  . Vitamin D, Ergocalciferol, (DRISDOL) 50000 UNITS CAPS capsule Take 50,000 Units by mouth every 7 (seven) days. Wednesday  . [DISCONTINUED] digoxin (LANOXIN) 0.125 MG tablet Take 125 mcg by mouth daily.      FAMILY HISTORY:  Her @FAMSTP (<SUBSCRIPT> error)@  SOCIAL HISTORY: She  reports that she has never smoked. She has never used smokeless tobacco. She reports that she does not drink alcohol or use drugs.  REVIEW OF SYSTEMS:   unable  SUBJECTIVE:  unable  VITAL SIGNS: BP (!) 132/106   Pulse (!) 131   Temp 97.8 F (36.6 C) (Oral)   Resp 18   Ht 5' (1.524 m)   Wt 64 kg (141 lb)   SpO2 93%   BMI 27.54 kg/m   HEMODYNAMICS:    VENTILATOR SETTINGS: Vent Mode: PRVC FiO2 (%):  [100 %] 100 % Set Rate:  [18 bmp] 18 bmp Vt Set:  [370 mL] 370 mL PEEP:  [5 cmH20] 5 cmH20 Plateau Pressure:  [26 cmH20] 26 cmH20  INTAKE / OUTPUT: No intake/output data recorded.  PHYSICAL EXAMINATION: General:  Elderly female in NAD Neuro:  Alert, oriented, non-focal HEENT:  [place]/AT, PERRL, JVD elevation to mandible Cardiovascular: IRIR tachy, no MRG Lungs: Very coarse, worse on expiration Abdomen:  Soft, non-distended Musculoskeletal:  No acute defomirty Skin:  Grossly intact  LABS:  BMET  Recent Labs Lab 04/13/2016 0225  NA 128*  K 4.8  CL 90*  CO2 24  BUN 19  CREATININE 1.37*  GLUCOSE 158*    Electrolytes  Recent Labs Lab 04/27/2016 0225  CALCIUM 9.3    CBC  Recent Labs Lab 04/03/2016 0225  WBC 29.9*  HGB 14.5  HCT 43.2  PLT 352    Coag's No results for input(s): APTT, INR in the last 168 hours.  Sepsis Markers  Recent Labs Lab 03/29/2016 0244  LATICACIDVEN 6.35*    ABG No results for input(s): PHART, PCO2ART, PO2ART in the last 168 hours.  Liver Enzymes  Recent Labs Lab 04/11/2016 0225  AST  38  ALT 15  ALKPHOS 113  BILITOT 1.8*  ALBUMIN 3.2*    Cardiac Enzymes No results for input(s): TROPONINI, PROBNP in the last 168 hours.  Glucose No results for input(s): GLUCAP in the last 168 hours.  Imaging Dg Chest Portable 1 View  Result Date: 04/10/2016 CLINICAL DATA:  Endotracheal tube placement.  Initial encounter. EXAM: PORTABLE CHEST 1 VIEW COMPARISON:  Chest radiograph performed earlier today at 2:21 a.m. FINDINGS: The patient's endotracheal tube is seen ending 1-2 cm above the carina. This could be retracted 1-2 cm. Bilateral airspace opacification raises concern for persistent pneumonia, perhaps slightly improved from the prior study. Underlying interstitial edema cannot be excluded. No pleural effusion or pneumothorax is seen. The cardiomediastinal silhouette is mildly enlarged. No acute osseous abnormalities are seen. External pacing pads are noted. IMPRESSION: 1. Endotracheal tube seen ending 1-2 cm above the carina. This could be retracted 1-2 cm. 2. Bilateral airspace opacification raises concern for persistent pneumonia, perhaps slightly improved from the prior study. Underlying interstitial edema cannot be excluded. 3. Mild cardiomegaly. Electronically Signed   By: Garald Balding  M.D.   On: 04/20/2016 03:22   Dg Chest Port 1 View  Result Date: 04/14/2016 CLINICAL DATA:  Acute onset of generalized chest pain and shortness of breath. Hemoptysis. Atrial fibrillation. Initial encounter. EXAM: PORTABLE CHEST 1 VIEW COMPARISON:  Chest radiograph performed 04/05/2016 FINDINGS: Diffuse bilateral airspace opacification is noted, worse on the left, concerning for multifocal pneumonia. No pleural effusion or pneumothorax is seen. The cardiomediastinal silhouette is mildly enlarged. No acute osseous abnormalities are seen. IMPRESSION: Diffuse bilateral airspace opacification, worse on the left, concerning for multifocal pneumonia. Mild cardiomegaly. Electronically Signed   By: Garald Balding M.D.   On: 04/20/2016 02:33     STUDIES:    CULTURES: Blood 11/24 > Urine 11/24 >  ANTIBIOTICS: Zosyn 11/24 > Vancomycin 11/24 >  SIGNIFICANT EVENTS: 11/8-11/10 admit to [facility] for fall, vertebral fracture and AFRVR 11/24 admit to ICU intubated  LINES/TUBES: ETT 11/24>  DISCUSSION:   ASSESSMENT / PLAN:  PULMONARY A: Acute hypercarbic/hypoxemic respiratory failure Healthcare associated pneumonia Pulmonary edema Interstitial lung disease (never had biopsy or formal workup)  P:   Full vent support Follow ABG Chest x-ray in the morning Ventilator associated pneumonia bundle  CARDIOVASCULAR A:  Atrial fibrillation with RVR Acute on chronic diastolic heart failure now exacerbated in the setting of tachycardia Lactic acid elevation-suspect secondary to work of breathing  P:  Continues to mature monitoring Diltiazem infusion with atenolol embolus Lasix 40 mg Assess and trend troponin Ensure lactic clearing Consult cardiology in a.m. DNR if arrests  RENAL A:   Acute kidney injury Stage III chronic kidney disease Hyponatremia suspect hypervolemic  P:   KVO IVF Lasix as above Trend BMP  GASTROINTESTINAL A:   No acute issues P:   Pepcid for stress ulcer prophylaxis Nothing by mouth  HEMATOLOGIC A:   Medication-induced coagulopathy, therapeutic in the setting of atrial fibrillation P:  Heparin per pharmacy consult in lieu of at home Xarelto  INFECTIOUS A:   Healthcare associated pneumonia P:   Antibiotics as above Follow cultures Send PCT Follow UA  ENDOCRINE A:   No acute issues P:    NEUROLOGIC A:   Acute metabolic encephalopathy P:   RASS goal: -1 to -2 Fentanyl infusion and bolus when necessary   FAMILY  - Updates: Family updated at bedside.   - Inter-disciplinary family meet or Palliative Care meeting due by:  11/30   Georgann Housekeeper, AGACNP-BC [place] [facility] Pager [phone] or [phone]  04/26/2016 4:22 AM   ATTENDING NOTE / ATTESTATION NOTE :   I have discussed the case with the resident/APP  Georgann Housekeeper NP  I agree with the resident/APP's  history, physical examination, assessment, and plans.    Briefly, patient with significant comorbidities including difficult to control atrial fibrillation for which she sees cardiology as an outpatient, recent diagnosis of interstitial lung disease, recent hypoxemia, chronic kidney disease, who presents with acute on chronic shortness of breath. Patient was also admitted at the start of the month for vertebral fracture after a fall. She was admitted and was discharged with a heart rate of 110-120. Conservative management done for the fracture. Had recent acute dyspnea with cough presenting to the emergency room. She was in rapid atrial fibrillation 120-180s. Blood pressure initially was 120/100. Sats of 91% on nonrebreather mask. She was noted to go into respiratory distress prompting to be intubated at the emergency room.   Patient seen, examined. Heart rate fluctuating 120-150, A. Fib. Blood pressure 110-88. 96% on 80% FiO2.  Afebrile. Intubated. Sedated. Comfortable. Chronically ill lady. ET tube in place. Crackles mid to bases. Variable s1. Diminished bowel sounds. Soft. Flat. Warm extremities. Trace edema.  Labs reviewed. Chest x-ray with prominent bilateral infiltrates suggestive of interstitial lung disease. More "infiltrates/fluffiness" compared to chest x-ray a couple weeks ago. ABG 7.18/64/143 ( I think vent has been adjusted), white count 35. Sodium 128 from baseline of 128. Creatinine 1.37 from baseline of 0.94. Bicarbonate 24. Lactic acid initially was 6.35, repeat was 3.  Assessment/Plan : 1. Acute on chronic hypoxemic hypercapnic respiratory failure secondary to the following: Most likely with healthcare associated pneumonia, possible pulmonary edema, CHF systolic/diastolic exacerbation (EF 45-50%). We extensively  discussed patient's overall condition and prognosis. Daughters have decided to make her a limited code with DO NOT RESUSCITATE and no ACLS. We will need to readdress CODE STATUS in the next couple of days. In the meantime, continue ventilatory support. Recheck ABG in the morning. Continue broad-spectrum antibiotics. Panculture.f/u lactate.  2. Atrial fibrillation, rapid ventricular response. Cardiology has been actively involved as an outpatient. We'll contact cardiology this morning. Continue  diltiazem drip. Patient was on diltiazem and metoprolol as an outpatient. We'll switch Xarelto to heparin drip. 3. Acute kidney injury. I think patient has gotten at least 1 L IV fluid since admission. Diuresis as blood pressure tolerates. 4. Cont other  meds.   I have edited the above note and modified it according to our agreed history, physical examination, assessment and plan.   I have spent 30 minutes of critical care time with this patient today.  Family :Family updated at length today. Discussed plan of care with patient's daughters.    Monica Becton, MD 04/26/2016, 5:42 AM [facility] Pager [phone] After 3 pm or if no answer, call [phone]

## 2016-04-21 NOTE — ED Notes (Signed)
Pt family discussing with pt the plan of action for post-intubation.

## 2016-04-21 NOTE — Progress Notes (Signed)
PCCM Interval Note  Called to pt's bedside following self-extubation. She initially had some stridor, hypoxemia. Was placed on 1.00 mask, stabilized. On my eval now she is oriented, follows commands. She has no stridor. She has B insp crackles that are unchanged compared with this morning.   Vitals:   04/22/2016 1335 04/13/2016 1400 04/10/2016 1430 04/01/2016 1500  BP: 93/61 92/74 92/76  (!) 124/95  Pulse: 84 (!) 158 (!) 54 (!) 146  Resp: (!) 27 20 (!) 22 (!) 25  Temp:      TempSrc:      SpO2: 99% 98% 100% 100%  Weight:      Height:       PLANS:  - will follow her closely for any evidence resp decline, stridor.  - consider BiPAP if she decompensates, would like to avoid re-intubation if at all possible.  - add metoprolol, bolus amiodarone for better rate control - diuretics as she can tolerate.  - will speak with patient and family regarding GOC, reintubation.   CC time 30 minutes  Baltazar Apo, MD, PhD 04/20/2016, 3:21 PM [facility] [phone] or if no answer [phone]

## 2016-04-21 NOTE — Progress Notes (Signed)
Discussed and per  Dr. Lamonte Sakai, as patient has poor venous access and is in need of vasoactive medications, will order PICC.

## 2016-04-21 NOTE — Progress Notes (Signed)
RT NOTE:  Pt transported to [id number] without complication. Report given to [place], RT.

## 2016-04-21 NOTE — ED Notes (Signed)
Critical care md at bedside  

## 2016-04-21 NOTE — Progress Notes (Signed)
PULMONARY / CRITICAL CARE MEDICINE   Name: Allison Sellers MRN: [id number] DOB: [birth year]    ADMISSION DATE:  04/17/2016 CONSULTATION DATE:  04/06/2016  REFERRING MD:  Dr. Claudine Mouton  CHIEF COMPLAINT:  Shortness of breath  HISTORY OF PRESENT ILLNESS:   82 year old female with past medical history as below, which is significant for diastolic CHF, chronic kidney disease, hypertension, interstitial lung disease (no biopsy or formal workup), and atrial fibrillation. She was recently admitted on November 8 and diagnosed with vertebral fracture after fall. Hospital course, it was atrial fibrillation with rapid ventricular response responsive to Cardizem drip. Recommended for conservative management of vertebral fracture and she was discharged on increased dose of metoprolol to manage for fibrillation. Since that time. Initial been improving, however, did experience a nonproductive cough intermittently. 11/23 awaiting placement in her kidney and experienced generalized malaise which developed into chest pressure and shortness of breath. She presented to the emergency department with these complaints where she was found to be nature fibrillation with rapid ventricular response with pulse 180s to 190s which has been refractory to 20 mg of metoprolol IV. She also had adventitious lung sounds on ER exam and chest x-ray demonstrated or edema versus multifocal pneumonia superimposed on chronic interstitial changes. Due to her significant work of breathing and progressive alteration in mental status she was intubated. She did have a standing DO NOT RESUSCITATE order however family decided that she should be intubated at this time. She was started on broad-spectrum antibiotics and [facility] was asked to admit.  REVIEW OF SYSTEMS:   unable  SUBJECTIVE:  unable  VITAL SIGNS: BP 96/65   Pulse (!) 55   Temp 97.8 F (36.6 C) (Axillary)   Resp (!) 22   Ht 5' (1.524 m)   Wt 60 kg (132 lb 4.4 oz)   SpO2 99%   BMI 25.83  kg/m   HEMODYNAMICS:    VENTILATOR SETTINGS: Vent Mode: PRVC FiO2 (%):  [70 %-100 %] 70 % Set Rate:  [18 bmp-24 bmp] 24 bmp Vt Set:  [370 mL] 370 mL PEEP:  [5 cmH20] 5 cmH20 Plateau Pressure:  [22 cmH20-26 cmH20] 22 cmH20  INTAKE / OUTPUT: I/O last 3 completed shifts: In: 275.2 [I.V.:25.2; IV Piggyback:250] Out: -   PHYSICAL EXAMINATION: General:  Elderly female in NAD Neuro:  Alert, oriented, non-focal HEENT:  [place]/AT, PERRL, JVD elevation to mandible Cardiovascular: IRIR tachy, no MRG Lungs: Very coarse, worse on expiration Abdomen:  Soft, non-distended Musculoskeletal:  No acute defomirty Skin:  Grossly intact  LABS:  BMET  Recent Labs Lab 04/16/2016 0225 04/03/2016 0525  NA 128* 126*  K 4.8 4.8  CL 90* 94*  CO2 24 20*  BUN 19 20  CREATININE 1.37* 1.09*  GLUCOSE 158* 231*    Electrolytes  Recent Labs Lab 04/18/2016 0225 04/02/2016 0525  CALCIUM 9.3 8.3*  MG  --  1.6*  PHOS  --  4.4    CBC  Recent Labs Lab 04/07/2016 0225 04/14/2016 0525  WBC 29.9* 34.5*  HGB 14.5 14.4  HCT 43.2 42.7  PLT 352 349    Coag's No results for input(s): APTT, INR in the last 168 hours.  Sepsis Markers  Recent Labs Lab 03/30/2016 0244 04/20/2016 0511 04/12/2016 0530  LATICACIDVEN 6.35* 3.08* 2.9*    ABG  Recent Labs Lab 04/14/2016 0412 04/11/2016 0600  PHART 7.179* 7.288*  PCO2ART 63.8* 47.4  PO2ART 143.0* 70.2*    Liver Enzymes  Recent Labs Lab 04/11/2016 0225  AST 38  ALT 15  ALKPHOS 113  BILITOT 1.8*  ALBUMIN 3.2*    Cardiac Enzymes  Recent Labs Lab 04/25/2016 0525  TROPONINI 0.30*    Glucose No results for input(s): GLUCAP in the last 168 hours.  Imaging Dg Chest Portable 1 View  Result Date: 04/26/2016 CLINICAL DATA:  Endotracheal tube placement.  Initial encounter. EXAM: PORTABLE CHEST 1 VIEW COMPARISON:  Chest radiograph performed earlier today at 2:21 a.m. FINDINGS: The patient's endotracheal tube is seen ending 1-2 cm above the carina.  This could be retracted 1-2 cm. Bilateral airspace opacification raises concern for persistent pneumonia, perhaps slightly improved from the prior study. Underlying interstitial edema cannot be excluded. No pleural effusion or pneumothorax is seen. The cardiomediastinal silhouette is mildly enlarged. No acute osseous abnormalities are seen. External pacing pads are noted. IMPRESSION: 1. Endotracheal tube seen ending 1-2 cm above the carina. This could be retracted 1-2 cm. 2. Bilateral airspace opacification raises concern for persistent pneumonia, perhaps slightly improved from the prior study. Underlying interstitial edema cannot be excluded. 3. Mild cardiomegaly. Electronically Signed   By: Garald Balding M.D.   On: 04/13/2016 03:22   Dg Chest Port 1 View  Result Date: 04/05/2016 CLINICAL DATA:  Acute onset of generalized chest pain and shortness of breath. Hemoptysis. Atrial fibrillation. Initial encounter. EXAM: PORTABLE CHEST 1 VIEW COMPARISON:  Chest radiograph performed 04/05/2016 FINDINGS: Diffuse bilateral airspace opacification is noted, worse on the left, concerning for multifocal pneumonia. No pleural effusion or pneumothorax is seen. The cardiomediastinal silhouette is mildly enlarged. No acute osseous abnormalities are seen. IMPRESSION: Diffuse bilateral airspace opacification, worse on the left, concerning for multifocal pneumonia. Mild cardiomegaly. Electronically Signed   By: Garald Balding M.D.   On: 04/13/2016 02:33     STUDIES:    CULTURES: Blood 11/24 > Urine 11/24 >  ANTIBIOTICS: Zosyn 11/24 > Vancomycin 11/24 >  SIGNIFICANT EVENTS: 11/8-11/10 admit to [facility] for fall, vertebral fracture and AFRVR 11/24 admit to ICU intubated  LINES/TUBES: ETT 11/24>  DISCUSSION:   ASSESSMENT / PLAN:  PULMONARY A: Acute hypercarbic/hypoxemic respiratory failure Possible Healthcare associated pneumonia Pulmonary edema Interstitial lung disease (never had biopsy or formal  workup)  P:   PRVC 8cc/kg  Follow ABG and CXR Wean FiO2 Diuresis if able to tolerate Ventilator associated pneumonia bundle  CARDIOVASCULAR A:  Atrial fibrillation with RVR Acute on chronic diastolic heart failure now exacerbated in the setting of tachycardia Lactic acid elevation-suspect secondary to work of breathing  P:  Diltiazem infusion Diuresis as she can tolerate from renal and hemodynamic standpoint Trend troponin Will ask cardiology to assist 11/24 DNR if arrests  RENAL A:   Acute kidney injury Stage III chronic kidney disease Hyponatremia suspect hypervolemic  P:   KVO IVF Lasix as q12h Trend BMP  GASTROINTESTINAL A:   No acute issues P:   Pepcid for stress ulcer prophylaxis Nothing by mouth  HEMATOLOGIC A:   Medication-induced coagulopathy, therapeutic in the setting of atrial fibrillation P:  Heparin per pharmacy consult in lieu of at home Xarelto  INFECTIOUS A:   Possible Healthcare associated pneumonia P:   Antibiotics as above Follow cultures Follow PCT Follow UA  ENDOCRINE A:   No acute issues P:    NEUROLOGIC A:   Acute metabolic encephalopathy P:   RASS goal: -1 to -2 Fentanyl infusion and bolus when necessary   FAMILY  - Updates: Family updated at bedside.   - Inter-disciplinary family meet or Palliative Care meeting due by:  11/30  Independent CC time 34 minutes  Baltazar Apo, MD, PhD 04/14/2016, 11:30 AM [facility] [phone] or if no answer [phone]

## 2016-04-21 NOTE — Progress Notes (Signed)
Allison Sellers, Daughter, family has decided NOT to re-intubate if needed. MD Rammaswamy made aware.

## 2016-04-21 NOTE — ED Notes (Signed)
Portable x-ray in room 

## 2016-04-21 NOTE — ED Triage Notes (Signed)
Pt arrived via [facility] c/o central chest pain x1 hour.  Pt reports uncontrolled A-fib.  Productive cough x1 week.  Pain 2/10.  [facility] gave 10mg  Albuterol and 0.5mg  Atrovent.

## 2016-04-21 NOTE — Progress Notes (Signed)
Initial Nutrition Assessment  DOCUMENTATION CODES:   Not applicable  INTERVENTION:   -If pt unable to extubate within 48 hours to intubation, recommend:  Initiate TF via OGT with Vital AF 1.2 at goal rate of 25 ml/h (1080 ml per day) to provide 1296 kcals, 81 gm protein, 876 ml free water daily.  NUTRITION DIAGNOSIS:   Inadequate oral intake related to inability to eat as evidenced by NPO status.  GOAL:   Patient will meet greater than or equal to 90% of their needs  MONITOR:   Vent status, Labs, Weight trends, Skin, I & O's  REASON FOR ASSESSMENT:   Ventilator    ASSESSMENT:   patient with significant comorbidities including difficult to control atrial fibrillation for which she sees cardiology as an outpatient, recent diagnosis of interstitial lung disease, recent hypoxemia, chronic kidney disease, who presents with acute on chronic shortness of breath.  Patient is currently intubated on ventilator support MV: 10.2 L/min Temp (24hrs), Avg:97.8 F (36.6 C), Min:97.6 F (36.4 C), Max:98.2 F (36.8 C)  Pt admitted with acute respiratory failure.  Spoke with pt daughter at bedside, who reports pt with decline in appetite due to early satiety. Pt has been supplementing with Ensure supplements PTA to assist with nutritional adequacy. Pt daughter shares that pt was recently hospitalized at [facility] for T12/T11 fx and was making great progress at home with PT. Pt is very active and independent at baseline.   Pt daughter denies pt with wt loss. Nutrition-Focused physical exam completed. Findings are mild fat depletion, no muscle depletion, and no edema.   Labs reviewed: Na: 126, Mg: 1.6.   Diet Order:  Diet NPO time specified  Skin:  Reviewed, no issues  Last BM:  PTA  Height:   Ht Readings from Last 1 Encounters:  04/20/2016 5' (1.524 m)    Weight:   Wt Readings from Last 1 Encounters:  04/06/2016 132 lb 4.4 oz (60 kg)    Ideal Body Weight:  45.5 kg  BMI:  Body  mass index is 25.83 kg/m.  Estimated Nutritional Needs:   Kcal:  1174.1  Protein:  75-90 grams  Fluid:  >1.1 L  EDUCATION NEEDS:   No education needs identified at this time  Friedrich Harriott A. Jimmye Norman, RD, LDN, CDE Pager: [phone] After hours Pager: [phone]

## 2016-04-21 NOTE — ED Notes (Signed)
Pt's left lower forearm is edematous and IV appears infiltrated in left lower forearm. IV removed. Pt still has 2 working IVs. Vancomycin moved from left lower forearm to left AC.

## 2016-04-21 NOTE — Consult Note (Addendum)
CARDIOLOGY CONSULT NOTE   Patient ID: Allison Sellers MRN: [id number], DOB/AGE: 07/22/1926   Admit date: 04/19/2016 Date of Consult: 04/14/2016  Primary Physician: Ann Held, DO Primary Cardiologist: Dr Ellyn Hack, Dr Rayann Heman  Reason for consult:  A-fib with RVR  Problem List  Past Medical History:  Diagnosis Date  . Chronic diastolic CHF (congestive heart failure) ([place])   . CKD (chronic kidney disease), stage III   . Diverticular disease   . Hypertension   . Interstitial lung disease ([place])    a. Noted on CT scan 02/2011 before initiation of any antiarrhythmic meds.  . Osteopenia   . Persistent atrial fibrillation ([place])   . Seborrheic keratosis 06/2014   of hand.     Past Surgical History:  Procedure Laterality Date  . 2D Echocardiogram  03/29/2011   EF 55-65%, normal  . CARDIOVERSION  04/18/2011   Procedure: CARDIOVERSION;  Surgeon: Leonie Man;  Location: [facility];  Service: Cardiovascular;  Laterality: N/A;  . CARDIOVERSION N/A 11/20/2014   Procedure: CARDIOVERSION;  Surgeon: Jolaine Artist, MD;  Location: [facility];  Service: Cardiovascular;  Laterality: N/A;  . COLONOSCOPY N/A 11/03/2014   Procedure: COLONOSCOPY;  Surgeon: Ladene Artist, MD;  Location: [facility] ENDOSCOPY;  Service: Endoscopy;  Laterality: N/A;  . Lexiscan Myoview  04/14/2011   No scintigraphic evidence of inducible myocardial ischemia, EKG negative for ischemia, patient developed Atrial Fibrillation with rapid ventricular response during stress and persisted in the recovery period, abnormal myocardial perfusion study  . UMBILICAL HERNIA REPAIR  ? 2013    Allergies  Allergies  Allergen Reactions  . Aspirin Other (See Comments)    Cannot have due to Xarelto and previous bleed  . Ciprofloxacin Other (See Comments) and Rash    Cant be given with her multaq   HPI   Allison Sellers is a 82 y.o. female with a h/o PAF difficult to control, on multiple different regimens in the past including multaq,  metoprolol, cardizem, persistent since the end of October, now difficult to control the rate resulting into acute on chronic combined systolic and diastolic CHF and acute respiratory failure.   She had breakthrough afib was last June at which time she required cardioversion, 3 previous cardioversions prior to this. She does have interstitial lung disease which would  make her not  an ideal candidate for amiodarone use. She does have CKD, which would significantly limit dose of tiksoyn to the effect it may not be effective. She is not a ablation candidate due to age. Also not a flecainide/sotalol candidate due to renal function. She reports that she does not feel bad in afib and for most part since increase of metoprolol to 25 mg bid,  she has decent rate control and  BP is OK with higher dose . She is on lower dose of xarelto at 15 mg with cr cl calc at 31.0 ml/min. She was admitted on 04/05/16 with T12 compression fracture, discharged on Toprol-XL 75 mg in AM and 50 mg in PM along with Cardizem CD 262m PO BID. At home up to 110'.   Echo in May showed mild global reduction in LV function, biatrial enlargement, trace MR and TR.  Inpatient Medications  . ceFEPime (MAXIPIME) IV  1 g Intravenous Q24H  . chlorhexidine gluconate (MEDLINE KIT)  15 mL Mouth Rinse BID  . famotidine (PEPCID) IV  20 mg Intravenous Q12H  . furosemide  40 mg Intravenous Q12H  . mouth rinse  15  mL Mouth Rinse 10 times per day  . sodium chloride flush  3 mL Intravenous Q12H  . vancomycin  750 mg Intravenous Q24H    Family History Family History  Problem Relation Age of Onset  . Heart failure Father     CHF  . Heart failure Mother     CHF  . Macular degeneration Mother   . Arthritis       Social History Social History   Social History  . Marital status: Widowed    Spouse name: N/A  . Number of children: N/A  . Years of education: N/A   Occupational History  . Not on file.   Social History Main Topics  .  Smoking status: Never Smoker  . Smokeless tobacco: Never Used  . Alcohol use No  . Drug use: No  . Sexual activity: Not Currently   Other Topics Concern  . Not on file   Social History Narrative   She is a widowed mother of 19, grandmother of 21, great grandmother of 1. She is usually very active and likes to walk .    recently.    She quit smoking 50 years ago. She denies any alcohol.    She says in order to try to make up for the fact she has not been doing much exercise she has been walking up and down the stairs at the house and staying as active as possible, just doing whatever activity she can do.      Review of Systems  General:  No chills, fever, night sweats or weight changes.  Cardiovascular:  No chest pain, dyspnea on exertion, edema, orthopnea, palpitations, paroxysmal nocturnal dyspnea. Dermatological: No rash, lesions/masses Respiratory: No cough, dyspnea Urologic: No hematuria, dysuria Abdominal:   No nausea, vomiting, diarrhea, bright red blood per rectum, melena, or hematemesis Neurologic:  No visual changes, wkns, changes in mental status. All other systems reviewed and are otherwise negative except as noted above.  Physical Exam  Blood pressure 96/65, pulse (!) 55, temperature 97.8 F (36.6 C), temperature source Axillary, resp. rate (!) 22, height 5' (1.524 m), weight 132 lb 4.4 oz (60 kg), SpO2 99 %.  General: Pleasant, NAD Psych: Normal affect. Neuro: Alert and oriented X 3. Moves all extremities spontaneously. HEENT: Normal  Neck: Supple without bruits or JVD. Lungs:  Resp regular and unlabored, crackles B/L. Heart: irregular rapid no s3, s4, or murmurs. Abdomen: Soft, non-tender, non-distended, BS + x 4.  Extremities: No clubbing, cyanosis or edema. DP/PT/Radials 2+ and equal bilaterally.  Labs   Recent Labs  04/11/2016 0525  TROPONINI 0.30*   Lab Results  Component Value Date   WBC 34.5 (H) 04/17/2016   HGB 14.4 04/10/2016   HCT 42.7  04/17/2016   MCV 95.1 04/16/2016   PLT 349 04/14/2016    Recent Labs Lab 03/30/2016 0225 04/11/2016 0525  NA 128* 126*  K 4.8 4.8  CL 90* 94*  CO2 24 20*  BUN 19 20  CREATININE 1.37* 1.09*  CALCIUM 9.3 8.3*  PROT 7.2  --   BILITOT 1.8*  --   ALKPHOS 113  --   ALT 15  --   AST 38  --   GLUCOSE 158* 231*   Lab Results  Component Value Date   CHOL 128 05/11/2015   HDL 56.10 05/11/2015   LDLCALC 52 05/11/2015   TRIG 97.0 05/11/2015   Radiology/Studies  Dg Chest Portable 1 View  Result Date: 04/09/2016 CLINICAL DATA:  Endotracheal tube placement.  Initial encounter. EXAM: PORTABLE CHEST 1 VIEW COMPARISON:  Chest radiograph performed earlier today at 2:21 a.m. FINDINGS: The patient's endotracheal tube is seen ending 1-2 cm above the carina. This could be retracted 1-2 cm. Bilateral airspace opacification raises concern for persistent pneumonia, perhaps slightly improved from the prior study. Underlying interstitial edema cannot be excluded. No pleural effusion or pneumothorax is seen. The cardiomediastinal silhouette is mildly enlarged. No acute osseous abnormalities are seen. External pacing pads are noted. IMPRESSION: 1. Endotracheal tube seen ending 1-2 cm above the carina. This could be retracted 1-2 cm. 2. Bilateral airspace opacification raises concern for persistent pneumonia, perhaps slightly improved from the prior study. Underlying interstitial edema cannot be excluded. 3. Mild cardiomegaly. Electronically Signed   By: Garald Balding M.D.   On: 03/30/2016 03:22   Dg Chest Port 1 View  Result Date: 04/11/2016 CLINICAL DATA:  Acute onset of generalized chest pain and shortness of breath. Hemoptysis. Atrial fibrillation. Initial encounter. EXAM: PORTABLE CHEST 1 VIEW COMPARISON:  Chest radiograph performed 04/05/2016 FINDINGS: Diffuse bilateral airspace opacification is noted, worse on the left, concerning for multifocal pneumonia. No pleural effusion or pneumothorax is seen. The  cardiomediastinal silhouette is mildly enlarged. No acute osseous abnormalities are seen. IMPRESSION: Diffuse bilateral airspace opacification, worse on the left, concerning for multifocal pneumonia. Mild cardiomegaly. Electronically Signed   By: Garald Balding M.D.   On: 04/22/2016 02:33   Echocardiogram: 09/2015 - Left ventricle: The cavity size was normal. Wall thickness was   normal. Systolic function was mildly reduced. The estimated   ejection fraction was in the range of 45% to 50%. Diffuse   hypokinesis. - Mitral valve: Calcified annulus. - Left atrium: The atrium was severely dilated. - Right atrium: The atrium was mildly dilated.  Impressions: - Mild global reduction in LV function; biatrial enlargement; trace   MR and TR.  ECG: a-fib with RVR    ASSESSMENT AND PLAN  1. Atrial Fibrillation with RVR - now persistent -nfailed all the antiarrhythmic regimens with Multaq, BB and CCB - not a candidate for Flecainide and Tikosyn given renal impairemment - we will start loading with iv amiodarone, ok with Dr Lamonte Sakai - on Chronic Xarelto  2. Acute on chronic combined systolic and diastolic CHF - agree with lasix 40 mg iv Q12 H, Crea 1.37--> 1.0 - Echo in 09/2015 showed EF of 45-50%, thought to be tachycardia induced.   3. HTN - rather hypotensive off metoprolol and cardizem  4. Stage 3 CKD - creatinine stable at 0.95.  5. Acute respiratory failure - possible pneumonia - leukocytosis, hypotension - acute CHF  Signed, Ena Dawley, MD, [facility] 03/31/2016, 11:46 AM

## 2016-04-21 NOTE — Progress Notes (Signed)
RT NOTE:  Critical ISTAT results reported to Georgann Housekeeper, NP. Vent settings adjusted: RR increased to 24, FiO2 decreased to 80%. RT will monitor.

## 2016-04-21 NOTE — Progress Notes (Signed)
Pharmacy Antibiotic Note  Allison Sellers is a 82 y.o. female admitted on 04/04/2016 with pneumonia.  Pharmacy has been consulted for Vancomycin/Cefepime dosing. WBC elevated, CXR with diffuse bilateral airspace opacities, mild bump in Scr noted.   Plan: -Vancomycin 750 mg IV q24h -Cefepime 1g IV q24h -Trend WBC, temp, renal function  -Drug levels as indicated   Height: 5' (152.4 cm) Weight: 141 lb (64 kg) IBW/kg (Calculated) : 45.5  Temp (24hrs), Avg:97.8 F (36.6 C), Min:97.8 F (36.6 C), Max:97.8 F (36.6 C)   Recent Labs Lab 04/13/2016 0225 04/17/2016 0244  WBC 29.9*  --   CREATININE 1.37*  --   LATICACIDVEN  --  6.35*    Estimated Creatinine Clearance: 24.2 mL/min (by C-G formula based on SCr of 1.37 mg/dL (H)).    Allergies  Allergen Reactions  . Aspirin Other (See Comments)    Cannot have due to Xarelto and previous bleed  . Ciprofloxacin Other (See Comments) and Rash    Cant be given with her Colette Ribas 04/22/2016 4:33 AM

## 2016-04-21 NOTE — Progress Notes (Addendum)
RT called to patient's room due to patient self extubated. ELink notified - could not hear E-link. Pt placed on NRB mack 15 L 100% FIO2, sat 100%.  Patient has stridor. RN paged E-Link

## 2016-04-21 NOTE — Progress Notes (Signed)
Peripherally Inserted Central Catheter/Midline Placement  The IV Nurse has discussed with the patient and/or persons authorized to consent for the patient, the purpose of this procedure and the potential benefits and risks involved with this procedure.  The benefits include less needle sticks, lab draws from the catheter, and the patient may be discharged home with the catheter. Risks include, but not limited to, infection, bleeding, blood clot (thrombus formation), and puncture of an artery; nerve damage and irregular heartbeat and possibility to perform a PICC exchange if needed/ordered by physician.  Alternatives to this procedure were also discussed.  Bard Power PICC patient education guide, fact sheet on infection prevention and patient information card has been provided to patient /or left at bedside.    PICC/Midline Placement Documentation     Information given to daughter   Darlyn Read 04/16/2016, 2:15 PM

## 2016-04-22 ENCOUNTER — Inpatient Hospital Stay (HOSPITAL_COMMUNITY): Payer: Medicare Other

## 2016-04-22 DIAGNOSIS — R7881 Bacteremia: Secondary | ICD-10-CM

## 2016-04-22 DIAGNOSIS — N189 Chronic kidney disease, unspecified: Secondary | ICD-10-CM

## 2016-04-22 DIAGNOSIS — Z7189 Other specified counseling: Secondary | ICD-10-CM

## 2016-04-22 DIAGNOSIS — N179 Acute kidney failure, unspecified: Secondary | ICD-10-CM

## 2016-04-22 LAB — BASIC METABOLIC PANEL
ANION GAP: 12 (ref 5–15)
BUN: 34 mg/dL — AB (ref 6–20)
CALCIUM: 8.8 mg/dL — AB (ref 8.9–10.3)
CO2: 24 mmol/L (ref 22–32)
Chloride: 93 mmol/L — ABNORMAL LOW (ref 101–111)
Creatinine, Ser: 1.69 mg/dL — ABNORMAL HIGH (ref 0.44–1.00)
GFR calc Af Amer: 30 mL/min — ABNORMAL LOW (ref >60)
GFR calc non Af Amer: 26 mL/min — ABNORMAL LOW (ref >60)
GLUCOSE: 125 mg/dL — AB (ref 65–99)
Potassium: 4.4 mmol/L (ref 3.5–5.1)
Sodium: 129 mmol/L — ABNORMAL LOW (ref 135–145)

## 2016-04-22 LAB — HEPARIN LEVEL (UNFRACTIONATED)
HEPARIN UNFRACTIONATED: 0.83 [IU]/mL — AB (ref 0.30–0.70)
HEPARIN UNFRACTIONATED: 0.37 [IU]/mL (ref 0.30–0.70)

## 2016-04-22 LAB — CBC
HEMATOCRIT: 37 % (ref 36.0–46.0)
Hemoglobin: 12.3 g/dL (ref 12.0–15.0)
MCH: 31.3 pg (ref 26.0–34.0)
MCHC: 33.2 g/dL (ref 30.0–36.0)
MCV: 94.1 fL (ref 78.0–100.0)
PLATELETS: 323 10*3/uL (ref 150–400)
RBC: 3.93 MIL/uL (ref 3.87–5.11)
RDW: 14.1 % (ref 11.5–15.5)
WBC: 21.1 10*3/uL — AB (ref 4.0–10.5)

## 2016-04-22 LAB — GLUCOSE, CAPILLARY
GLUCOSE-CAPILLARY: 160 mg/dL — AB (ref 65–99)
Glucose-Capillary: 112 mg/dL — ABNORMAL HIGH (ref 65–99)
Glucose-Capillary: 143 mg/dL — ABNORMAL HIGH (ref 65–99)
Glucose-Capillary: 152 mg/dL — ABNORMAL HIGH (ref 65–99)

## 2016-04-22 LAB — LACTIC ACID, PLASMA: LACTIC ACID, VENOUS: 1.3 mmol/L (ref 0.5–1.9)

## 2016-04-22 LAB — APTT
APTT: 47 s — AB (ref 24–36)
aPTT: 44 seconds — ABNORMAL HIGH (ref 24–36)

## 2016-04-22 LAB — URINE CULTURE: Culture: NO GROWTH

## 2016-04-22 LAB — PROTIME-INR
INR: 1.63
Prothrombin Time: 19.5 seconds — ABNORMAL HIGH (ref 11.4–15.2)

## 2016-04-22 LAB — MAGNESIUM: Magnesium: 2.3 mg/dL (ref 1.7–2.4)

## 2016-04-22 LAB — PHOSPHORUS: Phosphorus: 4.4 mg/dL (ref 2.5–4.6)

## 2016-04-22 MED ORDER — AMIODARONE LOAD VIA INFUSION
150.0000 mg | Freq: Once | INTRAVENOUS | Status: AC
Start: 2016-04-22 — End: 2016-04-22
  Administered 2016-04-22: 150 mg via INTRAVENOUS
  Filled 2016-04-22: qty 83.34

## 2016-04-22 MED ORDER — AMIODARONE HCL IN DEXTROSE 360-4.14 MG/200ML-% IV SOLN: 60.0000 mg/h | INTRAVENOUS | Status: DC

## 2016-04-22 MED ORDER — INSULIN ASPART 100 UNIT/ML ~~LOC~~ SOLN
0.0000 [IU] | SUBCUTANEOUS | Status: DC
  Administered 2016-04-22 (×2): 2 [IU] via SUBCUTANEOUS
  Administered 2016-04-22: 1 [IU] via SUBCUTANEOUS

## 2016-04-22 MED ORDER — DIGOXIN 0.25 MG/ML IJ SOLN: 0.1250 mg | Freq: Every day | INTRAMUSCULAR | Status: DC

## 2016-04-22 MED ORDER — DIGOXIN 0.25 MG/ML IJ SOLN: 0.2500 mg | Freq: Three times a day (TID) | INTRAMUSCULAR | Status: DC

## 2016-04-22 MED ORDER — MORPHINE SULFATE (PF) 2 MG/ML IV SOLN
2.0000 mg | INTRAVENOUS | Status: DC | PRN
  Administered 2016-04-22 – 2016-04-23 (×4): 4 mg via INTRAVENOUS
  Administered 2016-04-23: 2 mg via INTRAVENOUS
  Administered 2016-04-23 (×2): 4 mg via INTRAVENOUS
  Filled 2016-04-22 (×3): qty 2
  Filled 2016-04-22: qty 1
  Filled 2016-04-22 (×3): qty 2

## 2016-04-22 MED ORDER — LEVALBUTEROL HCL 0.63 MG/3ML IN NEBU
0.6300 mg | INHALATION_SOLUTION | Freq: Four times a day (QID) | RESPIRATORY_TRACT | Status: DC | PRN
  Administered 2016-04-22 (×3): 0.63 mg via RESPIRATORY_TRACT
  Filled 2016-04-22 (×3): qty 3

## 2016-04-22 MED ORDER — DILTIAZEM HCL 25 MG/5ML IV SOLN
10.0000 mg | Freq: Once | INTRAVENOUS | Status: AC
  Administered 2016-04-22: 10 mg via INTRAVENOUS
  Filled 2016-04-22: qty 5

## 2016-04-22 MED ORDER — AMIODARONE HCL IN DEXTROSE 360-4.14 MG/200ML-% IV SOLN
60.0000 mg/h | INTRAVENOUS | Status: DC
  Administered 2016-04-22 – 2016-04-23 (×3): 60 mg/h via INTRAVENOUS
  Filled 2016-04-22 (×3): qty 200

## 2016-04-22 MED ORDER — METOPROLOL TARTRATE 5 MG/5ML IV SOLN
5.0000 mg | Freq: Once | INTRAVENOUS | Status: AC
  Administered 2016-04-22: 5 mg via INTRAVENOUS
  Filled 2016-04-22: qty 5

## 2016-04-22 MED ORDER — DILTIAZEM HCL 30 MG PO TABS
30.0000 mg | ORAL_TABLET | Freq: Three times a day (TID) | ORAL | Status: DC
  Administered 2016-04-22 (×2): 30 mg via ORAL
  Filled 2016-04-22 (×3): qty 1

## 2016-04-22 MED ORDER — AMIODARONE HCL IN DEXTROSE 360-4.14 MG/200ML-% IV SOLN: 30.0000 mg/h | INTRAVENOUS | Status: DC

## 2016-04-22 MED ORDER — DIGOXIN 0.25 MG/ML IJ SOLN
0.2500 mg | Freq: Three times a day (TID) | INTRAMUSCULAR | Status: AC
  Administered 2016-04-22 – 2016-04-23 (×3): 0.25 mg via INTRAVENOUS
  Filled 2016-04-22 (×3): qty 2

## 2016-04-22 MED ORDER — MORPHINE SULFATE (PF) 2 MG/ML IV SOLN
1.0000 mg | INTRAVENOUS | Status: DC | PRN
  Administered 2016-04-22: 1 mg via INTRAVENOUS
  Filled 2016-04-22: qty 1

## 2016-04-22 MED ORDER — DILTIAZEM HCL 25 MG/5ML IV SOLN
5.0000 mg | Freq: Once | INTRAVENOUS | Status: AC
  Administered 2016-04-22: 5 mg via INTRAVENOUS
  Filled 2016-04-22: qty 5

## 2016-04-22 MED ORDER — SERTRALINE HCL 25 MG PO TABS: 25.0000 mg | ORAL_TABLET | Freq: Every day | ORAL | Status: DC

## 2016-04-22 MED ORDER — AMIODARONE HCL IN DEXTROSE 360-4.14 MG/200ML-% IV SOLN
60.0000 mg/h | INTRAVENOUS | Status: DC
  Administered 2016-04-22: 60 mg/h via INTRAVENOUS
  Filled 2016-04-22: qty 200

## 2016-04-22 NOTE — Progress Notes (Signed)
Pt noted in respiratory distress. O2 SATs in the 80's, increased work of breathing, tachypneic in 30-40's, HR still 150-160's and states she's having trouble breathing. Attempts made to reach respiratory therapist but could never get an answer. Pt placed on non rebreather mask which improved o2 SATs but other symptoms as stated above remained. Dr. Milinda Hirschfeld notified and gave no new orders at this time. Dr. Caryl Comes notified as well and gave orders to bolus amiodarone and give Cardizem IV push. Also spoke with Dr. Corrie Dandy who ordered Morphine PRN. Pt's daughter Manuela Schwartz was called but did not answer so message was left.

## 2016-04-22 NOTE — Progress Notes (Signed)
eLink Physician-Brief Progress Note Patient Name: Allison Sellers DOB: 05-04-1927 MRN: [id number]   Date of Service  04/22/2016  HPI/Events of Note    eICU Interventions  D/w daughters They agree with increasing comfort measures Increase morphine 2-4 mg q 2 prn If  WOB remains high, use morphine gtt Ct medical care, no bipap     Intervention Category Major Interventions: End of life / care limitation discussion  [name] V. 04/22/2016, 4:16 PM

## 2016-04-22 NOTE — Evaluation (Signed)
Clinical/Bedside Swallow Evaluation Patient Details  Name: Allison Sellers MRN: [id number] Date of Birth: 18-Apr-1927  Today's Date: 04/22/2016 Time: SLP Start Time (ACUTE ONLY): 1048 SLP Stop Time (ACUTE ONLY): 1105 SLP Time Calculation (min) (ACUTE ONLY): 17 min  Past Medical History:  Past Medical History:  Diagnosis Date  . Chronic diastolic CHF (congestive heart failure) ([place])   . CKD (chronic kidney disease), stage III   . Diverticular disease   . Hypertension   . Interstitial lung disease ([place])    a. Noted on CT scan 02/2011 before initiation of any antiarrhythmic meds.  . Osteopenia   . Persistent atrial fibrillation ([place])   . Seborrheic keratosis 06/2014   of hand.    Past Surgical History:  Past Surgical History:  Procedure Laterality Date  . 2D Echocardiogram  03/29/2011   EF 55-65%, normal  . CARDIOVERSION  04/18/2011   Procedure: CARDIOVERSION;  Surgeon: Leonie Man;  Location: [facility];  Service: Cardiovascular;  Laterality: N/A;  . CARDIOVERSION N/A 11/20/2014   Procedure: CARDIOVERSION;  Surgeon: Jolaine Artist, MD;  Location: [facility];  Service: Cardiovascular;  Laterality: N/A;  . COLONOSCOPY N/A 11/03/2014   Procedure: COLONOSCOPY;  Surgeon: Ladene Artist, MD;  Location: [facility] ENDOSCOPY;  Service: Endoscopy;  Laterality: N/A;  . Lexiscan Myoview  04/14/2011   No scintigraphic evidence of inducible myocardial ischemia, EKG negative for ischemia, patient developed Atrial Fibrillation with rapid ventricular response during stress and persisted in the recovery period, abnormal myocardial perfusion study  . UMBILICAL HERNIA REPAIR  ? 2013   HPI:  82 y.o.female admitted w/ acute respiratory failure and atrial fibrillation with rapid ventricular response. Intubated on arrival and patient self-extubated 11/24. Family has went back to her original DNR status. Pt with probable H influenzae pna. Daughter reports pt has not been able to eat well, is hungry but feels she will  choke, regurgitates her food, avoids meats and certain textures. Has a long history of GER.    Assessment / Plan / Recommendation Clinical Impression  Pt presents with appearance of a primary esophageal dysphagia that pt has apparently suffered from for years. She has been offered referral to GI by prirmary MD but did not wish to f/u. Pt reports discomfort in lower chest with PO intake, regurgitation of liquids, early fullness with meals, inability to get adequate nutrition. SLP observed pt to have adequate ability to orally manipulate PO and no immediate signs of aspiration. Voice is clear despite recent self extubation. Pt did have requent belching with minimal intake and regurgitated water after being given sips with a pill, a minute or so after swallowing. Problem appears quite severe and puts pt at high risk of postprandial aspiration. Discussed at length with pt and daughter and offered basic esophageal precautions and softened diet. Also reported to MD who agreed to a soft diet with precautions and no immediate work up given pts frailty. Family to f/u with GI if pts condition improves.     Aspiration Risk  Severe aspiration risk;Risk for inadequate nutrition/hydration    Diet Recommendation Dysphagia 3 (Mech soft);Thin liquid   Liquid Administration via: Cup;Straw Medication Administration: Whole meds with liquid (follow with a bite of puree) Supervision: Patient able to self feed;Full supervision/cueing for compensatory strategies Compensations: Slow rate;Small sips/bites;Follow solids with liquid Postural Changes: Seated upright at 90 degrees;Remain upright for at least 30 minutes after po intake    Other  Recommendations Recommended Consults: Consider GI evaluation;Consider esophageal assessment Oral Care Recommendations: Oral  care BID   Follow up Recommendations        Frequency and Duration            Prognosis        Swallow Study   General HPI: 82 y.o.female admitted  w/ acute respiratory failure and atrial fibrillation with rapid ventricular response. Intubated on arrival and patient self-extubated 11/24. Family has went back to her original DNR status. Pt with probable H influenzae pna. Daughter reports pt has not been able to eat well, is hungry but feels she will choke, regurgitates her food, avoids meats and certain textures. Has a long history of GER.  Type of Study: Bedside Swallow Evaluation Diet Prior to this Study: NPO Temperature Spikes Noted: No Respiratory Status: Nasal cannula History of Recent Intubation: Yes Length of Intubations (days): 1 days Date extubated: 04/22/2016 Behavior/Cognition: Alert;Cooperative;Pleasant mood Oral Cavity Assessment: Within Functional Limits Oral Care Completed by SLP: No Oral Cavity - Dentition: Adequate natural dentition Vision: Functional for self-feeding Self-Feeding Abilities: Able to feed self Patient Positioning: Upright in bed Baseline Vocal Quality: Normal Volitional Cough: Strong Volitional Swallow: Able to elicit    Oral/Motor/Sensory Function Overall Oral Motor/Sensory Function: Within functional limits   Ice Chips Ice chips: Not tested   Thin Liquid Thin Liquid: Within functional limits Presentation: Cup;Straw;Self Fed    Nectar Thick Nectar Thick Liquid: Not tested   Honey Thick Honey Thick Liquid: Not tested   Puree Puree: Within functional limits Presentation: Self Fed;Spoon   Solid   GO   Solid: Within functional limits Presentation: [place] Aragon Scarantino, MA CCC-SLP [phone]  Lynann Beaver 04/22/2016,12:04 PM

## 2016-04-22 NOTE — Progress Notes (Signed)
ANTICOAGULATION CONSULT NOTE - Follow Up Consult  Pharmacy Consult for Heparin Indication: atrial fibrillation  Allergies  Allergen Reactions  . Aspirin Other (See Comments)    Cannot have due to Xarelto and previous bleed  . Ciprofloxacin Other (See Comments) and Rash    Cant be given with her multaq    Patient Measurements: Height: 5' (152.4 cm) Weight: 136 lb 0.4 oz (61.7 kg) IBW/kg (Calculated) : 45.5  Vital Signs: Temp: 98.2 F (36.8 C) (11/25 1642) Temp Source: Axillary (11/25 1642) BP: 156/100 (11/25 1900) Pulse Rate: 83 (11/25 1900)  Labs:  Recent Labs  04/02/2016 0225 04/12/2016 0525  04/20/2016 1033 04/02/2016 1722 03/29/2016 2200 04/22/16 0500 04/22/16 0800 04/22/16 1859  HGB 14.5 14.4  --   --   --   --  12.3  --   --   HCT 43.2 42.7  --   --   --   --  37.0  --   --   PLT 352 349  --   --   --   --  323  --   --   APTT  --   --   < > 42*  --  55*  --  47* 44*  LABPROT  --   --   --  28.2*  --   --   --  19.5*  --   INR  --   --   --  2.59  --   --   --  1.63  --   HEPARINUNFRC  --   --   --  >2.20*  --   --   --  0.83* 0.37  CREATININE 1.37* 1.09*  --   --   --   --  1.69*  --   --   TROPONINI  --  0.30*  --  0.04* 0.05*  --   --   --   --   < > = values in this interval not displayed.  Estimated Creatinine Clearance: 19.3 mL/min (by C-G formula based on SCr of 1.69 mg/dL (H)).   Medical History: Past Medical History:  Diagnosis Date  . Chronic diastolic CHF (congestive heart failure) ([place])   . CKD (chronic kidney disease), stage III   . Diverticular disease   . Hypertension   . Interstitial lung disease ([place])    a. Noted on CT scan 02/2011 before initiation of any antiarrhythmic meds.  . Osteopenia   . Persistent atrial fibrillation ([place])   . Seborrheic keratosis 06/2014   of hand.     Medications:  No current facility-administered medications on file prior to encounter.    Current Outpatient Prescriptions on File Prior to Encounter  Medication  Sig Dispense Refill  . diltiazem (CARDIZEM CD) 240 MG 24 hr capsule Take 1 capsule (240 mg total) by mouth at bedtime. (Patient taking differently: Take 240 mg by mouth 2 (two) times daily. ) 30 capsule 0  . lisinopril (PRINIVIL,ZESTRIL) 5 MG tablet Take 0.5 tablets (2.5 mg total) by mouth daily. 90 tablet 3  . metoprolol succinate (TOPROL-XL) 50 MG 24 hr tablet Take 1 tablet (50 mg total) by mouth at bedtime. Take with or immediately following a meal. (Patient taking differently: Take 75 mg by mouth at bedtime. Take with or immediately following a meal.) 30 tablet 0  . nitroGLYCERIN (NITROSTAT) 0.4 MG SL tablet Place 1 tablet (0.4 mg total) under the tongue every 5 (five) minutes as needed. For chest pain 25 tablet 8  .  Rivaroxaban (XARELTO) 15 MG TABS tablet Take 1 tablet (15 mg total) by mouth daily. (Patient taking differently: Take 15 mg by mouth every evening. ) 90 tablet 3  . docusate sodium (COLACE) 100 MG capsule Take 1 capsule (100 mg total) by mouth 2 (two) times daily as needed for mild constipation. (Patient not taking: Reported on 04/25/2016) 60 capsule 0  . oxyCODONE-acetaminophen (PERCOCET/ROXICET) 5-325 MG tablet Take 1 tablet by mouth every 6 (six) hours as needed for severe pain. (Patient not taking: Reported on 04/14/2016) 15 tablet 0  . sertraline (ZOLOFT) 25 MG tablet Take 1 tablet (25 mg total) by mouth daily. (Patient not taking: Reported on 03/31/2016) 30 tablet 1  . traMADol (ULTRAM) 50 MG tablet Take 1 tablet (50 mg total) by mouth every 12 (twelve) hours as needed for severe pain. (Patient not taking: Reported on 04/24/2016) 20 tablet 0  . Vitamin D, Ergocalciferol, (DRISDOL) 50000 UNITS CAPS capsule Take 50,000 Units by mouth every 7 (seven) days. Wednesday    . [DISCONTINUED] digoxin (LANOXIN) 0.125 MG tablet Take 125 mcg by mouth daily.         Assessment: 82 y.o. female admitted 03/29/2016 on Xarelto PTA for Afib - now on hold. Patient intubated and family not present  at time of medication history, so unclear when last dose of Xarelto taken.  BL HL > 2.2, INR 2.5 - false elevated in setting rivaroxaban pta.  aPTT 42 sec will use this to guide heparin dosing until aPTT and HL correlate.    HL 0.37, INR 1.63, aPTT 44 sec. Not correlating yet, will increase dose. Nurse reports no issues with infusion or bleeding.  Goal of Therapy:  Heparin level 0.3-0.7 units/ml aPTT 66-102 seconds Monitor platelets by anticoagulation protocol: Yes   Plan:  Increase heparin to 1150 units/hr Check aPTT in 8 hours Monitor daily aPTT, heparin level, CBC and signs/symptoms of bleeding  Andrey Cota. Diona Foley, PharmD, [place] Clinical Pharmacist Pager [phone] 04/22/2016, 7:40 PM

## 2016-04-22 NOTE — Progress Notes (Signed)
ANTICOAGULATION CONSULT NOTE - Follow Up Consult  Pharmacy Consult for Heparin Indication: atrial fibrillation  Allergies  Allergen Reactions  . Aspirin Other (See Comments)    Cannot have due to Xarelto and previous bleed  . Ciprofloxacin Other (See Comments) and Rash    Cant be given with her multaq    Patient Measurements: Height: 5' (152.4 cm) Weight: 136 lb 0.4 oz (61.7 kg) IBW/kg (Calculated) : 45.5  Vital Signs: Temp: 98.2 F (36.8 C) (11/25 0811) Temp Source: Oral (11/25 0811) BP: 113/97 (11/25 0811) Pulse Rate: 40 (11/25 0811)  Labs:  Recent Labs  03/29/2016 0225 04/10/2016 0525 04/24/2016 1033 03/29/2016 1722 04/19/2016 2200 04/22/16 0500 04/22/16 0800  HGB 14.5 14.4  --   --   --  12.3  --   HCT 43.2 42.7  --   --   --  37.0  --   PLT 352 349  --   --   --  323  --   APTT  --   --  42*  --  55*  --  47*  LABPROT  --   --  28.2*  --   --   --  19.5*  INR  --   --  2.59  --   --   --  1.63  HEPARINUNFRC  --   --  >2.20*  --   --   --  0.83*  CREATININE 1.37* 1.09*  --   --   --  1.69*  --   TROPONINI  --  0.30* 0.04* 0.05*  --   --   --     Estimated Creatinine Clearance: 19.3 mL/min (by C-G formula based on SCr of 1.69 mg/dL (H)).   Medical History: Past Medical History:  Diagnosis Date  . Chronic diastolic CHF (congestive heart failure) ([place])   . CKD (chronic kidney disease), stage III   . Diverticular disease   . Hypertension   . Interstitial lung disease ([place])    a. Noted on CT scan 02/2011 before initiation of any antiarrhythmic meds.  . Osteopenia   . Persistent atrial fibrillation ([place])   . Seborrheic keratosis 06/2014   of hand.     Medications:  No current facility-administered medications on file prior to encounter.    Current Outpatient Prescriptions on File Prior to Encounter  Medication Sig Dispense Refill  . diltiazem (CARDIZEM CD) 240 MG 24 hr capsule Take 1 capsule (240 mg total) by mouth at bedtime. (Patient taking differently: Take  240 mg by mouth 2 (two) times daily. ) 30 capsule 0  . lisinopril (PRINIVIL,ZESTRIL) 5 MG tablet Take 0.5 tablets (2.5 mg total) by mouth daily. 90 tablet 3  . metoprolol succinate (TOPROL-XL) 50 MG 24 hr tablet Take 1 tablet (50 mg total) by mouth at bedtime. Take with or immediately following a meal. (Patient taking differently: Take 75 mg by mouth at bedtime. Take with or immediately following a meal.) 30 tablet 0  . nitroGLYCERIN (NITROSTAT) 0.4 MG SL tablet Place 1 tablet (0.4 mg total) under the tongue every 5 (five) minutes as needed. For chest pain 25 tablet 8  . Rivaroxaban (XARELTO) 15 MG TABS tablet Take 1 tablet (15 mg total) by mouth daily. (Patient taking differently: Take 15 mg by mouth every evening. ) 90 tablet 3  . docusate sodium (COLACE) 100 MG capsule Take 1 capsule (100 mg total) by mouth 2 (two) times daily as needed for mild constipation. (Patient not taking: Reported on 04/09/2016) 60  capsule 0  . oxyCODONE-acetaminophen (PERCOCET/ROXICET) 5-325 MG tablet Take 1 tablet by mouth every 6 (six) hours as needed for severe pain. (Patient not taking: Reported on 04/03/2016) 15 tablet 0  . sertraline (ZOLOFT) 25 MG tablet Take 1 tablet (25 mg total) by mouth daily. (Patient not taking: Reported on 04/13/2016) 30 tablet 1  . traMADol (ULTRAM) 50 MG tablet Take 1 tablet (50 mg total) by mouth every 12 (twelve) hours as needed for severe pain. (Patient not taking: Reported on 04/27/2016) 20 tablet 0  . Vitamin D, Ergocalciferol, (DRISDOL) 50000 UNITS CAPS capsule Take 50,000 Units by mouth every 7 (seven) days. Wednesday    . [DISCONTINUED] digoxin (LANOXIN) 0.125 MG tablet Take 125 mcg by mouth daily.         Assessment: 82 y.o. female admitted 04/08/2016 on Xarelto PTA for Afib - now on hold. Patient intubated and family not present at time of medication history, so unclear when last dose of Xarelto taken.  BL HL > 2.2, INR 2.5 - false elevated in setting rivaroxaban pta.  aPTT 42 sec  will use this to guide heparin dosing until aPTT and HL correlate.    HL 0.83, INR 1.63, aPTT 47 sec. Not correlating yet, will increase dose.  Goal of Therapy:  APTT 66-102 while Xarelto affecting anti-Xa levels Heparin level 0.3-0.7 units/ml Monitor platelets by anticoagulation protocol: Yes   Plan:  - Increase heparin to 950 units/hr - Check aPTT/Heparin level in 8 hours - Monitor daily aPTT, heparin level, CBC and signs/symptoms of bleeding  Dimitri Ped, PharmD. PGY-2 Infectious Diseases Pharmacy Resident Pager: [phone] 04/22/2016, 10:33 AM

## 2016-04-22 NOTE — Progress Notes (Signed)
125 ml of fentanyl gtt wasted in sink, witnessed by [employer]

## 2016-04-22 NOTE — Progress Notes (Addendum)
PULMONARY / CRITICAL CARE MEDICINE   Name: Allison Sellers MRN: [id number] DOB: 1926/07/17    ADMISSION DATE:  04/27/2016 CONSULTATION DATE:  04/20/2016  REFERRING MD:  Dr. Claudine Mouton  CHIEF COMPLAINT:  Shortness of breath  HISTORY OF PRESENT ILLNESS:   82 year old female with past medical history as below, which is significant for diastolic CHF, chronic kidney disease, hypertension, interstitial lung disease (no biopsy or formal workup), and atrial fibrillation. She was recently admitted on November 8 and diagnosed with vertebral fracture after fall. Hospital course, it was atrial fibrillation with rapid ventricular response responsive to Cardizem drip. Recommended for conservative management of vertebral fracture and she was discharged on increased dose of metoprolol to manage for fibrillation. Since that time. Initial been improving, however, did experience a nonproductive cough intermittently. 11/23 awaiting placement in her kidney and experienced generalized malaise which developed into chest pressure and shortness of breath. She presented to the emergency department with these complaints where she was found to be nature fibrillation with rapid ventricular response with pulse 180s to 190s which has been refractory to 20 mg of metoprolol IV. She also had adventitious lung sounds on ER exam and chest x-ray demonstrated or edema versus multifocal pneumonia superimposed on chronic interstitial changes. Due to her significant work of breathing and progressive alteration in mental status she was intubated. She did have a standing DO NOT RESUSCITATE order however family decided that she should be intubated at this time. She was started on broad-spectrum antibiotics and [place] was asked to admit.   SUBJECTIVE:  Patient self-extubated yesterday. Family decided not to re-intubate if she declines last evening. Patient had ongoing tachycardia overnight but didn't respond consistently to diltiazem or lopressor. Patient  reports dyspnea fluctuates and she has a continued nonproductive cough. Denies any chest pain or pressure.  REVIEW OF SYSTEMS:  No fever or chills. No abdominal pain. Transient nausea overnight that has resolved. No headaches or vision changes.    VITAL SIGNS: BP (!) 124/95   Pulse (!) 168   Temp 98 F (36.7 C) (Oral)   Resp (!) 25   Ht 5' (1.524 m)   Wt 136 lb 0.4 oz (61.7 kg)   SpO2 93%   BMI 26.57 kg/m   HEMODYNAMICS:    VENTILATOR SETTINGS: Vent Mode: PRVC FiO2 (%):  [60 %-100 %] 100 % Set Rate:  [24 bmp] 24 bmp Vt Set:  [370 mL] 370 mL PEEP:  [5 cmH20] 5 cmH20 Plateau Pressure:  [28 cmH20] 28 cmH20  INTAKE / OUTPUT: I/O last 3 completed shifts: In: 1389.9 [I.V.:949.9; Other:40; IV Piggyback:400] Out: 900 [Urine:900]  PHYSICAL EXAMINATION: General:  Elderly female. Awake. Alert. Neuro:  Moving all 4 extremities equally. Oriented to person, year, place, and president. CN grossly in tact. HEENT:  Moist membranes. No scleral icterus or injection.  Cardiovascular: Tachycardic with irregular rhythm. No edema. Atrial fibrillation on telemetry. Pulmonary:  Coarse crackles bilaterally. Mildly increased work of breathing on high flow nasal cannula. Abdomen:  Soft. Nontender. Normal bowel sounds. Integument:  Warm & dry. No rash on exposed skin.   LABS:  BMET  Recent Labs Lab 04/20/2016 0225 04/12/2016 0525 04/22/16 0500  NA 128* 126* 129*  K 4.8 4.8 4.4  CL 90* 94* 93*  CO2 24 20* 24  BUN 19 20 34*  CREATININE 1.37* 1.09* 1.69*  GLUCOSE 158* 231* 125*    Electrolytes  Recent Labs Lab 04/07/2016 0225 04/07/2016 0525 04/22/16 0500  CALCIUM 9.3 8.3* 8.8*  MG  --  1.6* 2.3  PHOS  --  4.4 4.4    CBC  Recent Labs Lab 04/24/2016 0225 04/11/2016 0525 04/22/16 0500  WBC 29.9* 34.5* 21.1*  HGB 14.5 14.4 12.3  HCT 43.2 42.7 37.0  PLT 352 349 323    Coag's  Recent Labs Lab 04/13/2016 1033 04/27/2016 2200  APTT 42* 55*  INR 2.59  --     Sepsis  Markers  Recent Labs Lab 04/22/2016 0244 04/22/2016 0511 04/06/2016 0530  LATICACIDVEN 6.35* 3.08* 2.9*    ABG  Recent Labs Lab 04/20/2016 0412 04/16/2016 0600  PHART 7.179* 7.288*  PCO2ART 63.8* 47.4  PO2ART 143.0* 70.2*    Liver Enzymes  Recent Labs Lab 04/12/2016 0225  AST 38  ALT 15  ALKPHOS 113  BILITOT 1.8*  ALBUMIN 3.2*    Cardiac Enzymes  Recent Labs Lab 04/19/2016 0525 04/14/2016 1033 04/24/2016 1722  TROPONINI 0.30* 0.04* 0.05*    Glucose No results for input(s): GLUCAP in the last 168 hours.  Imaging Dg Chest Port 1 View  Result Date: 04/22/2016 CLINICAL DATA:  Respiratory failure EXAM: PORTABLE CHEST 1 VIEW COMPARISON:  04/03/2016 FINDINGS: Cardiac shadow is stable. Right-sided PICC line is again seen and stable. The endotracheal tube and nasogastric catheter have been removed in the interval. Persistent left basilar infiltrate is noted. New patchy infiltrates are seen in the upper lobes bilaterally. No bony abnormality is noted. IMPRESSION: Increasing bilateral infiltrates. Electronically Signed   By: Inez Catalina M.D.   On: 04/22/2016 07:06   Dg Chest Port 1 View  Result Date: 04/09/2016 CLINICAL DATA:  Central catheter placement EXAM: PORTABLE CHEST 1 VIEW COMPARISON:  Study obtained earlier in the day FINDINGS: Central catheter tip is in the right atrium, approximately 3.5 cm beyond the cavoatrial junction. Endotracheal tube tip is 1.2 cm above the carina. Nasogastric tube tip and side port are below the diaphragm. No pneumothorax. There is airspace consolidation in the left mid lower lung zones. Lungs elsewhere clear. Heart is mildly enlarged with pulmonary vascularity within normal limits. There is atherosclerotic calcification in the aorta. No adenopathy. No bone lesions. IMPRESSION: Tube and catheter positions as described without pneumothorax. Left mid and lower lung zone airspace consolidation, likely pneumonia. Lungs elsewhere clear. There has been clearing  of interstitial edema since study obtained earlier in the day. There is aortic atherosclerosis. Electronically Signed   By: Lowella Grip III M.D.   On: 03/29/2016 14:57     STUDIES:  Port CXR 11/25:  Personally reviewed by me. Slight worsening of bilateral alveolar opacities compared with previous CXR left more than right. No pleural effusion appreciated.  MICROBIOLOGY: MRSA PCR 11/24:  Negative Blood Ctx x2 11/24 >> PCR Positive H influenzae Urine Ctx 11/24 >>  ANTIBIOTICS: Zosyn 11/24 (stopped same day) Vancomycin 11/24 (stopped same day) Rocephin 11/24 >>  SIGNIFICANT EVENTS: 11/8-11/10 admit to [facility] for fall, vertebral fracture and AFRVR 11/24 - admit to ICU intubated & self-extubated afternoon 11/24  LINES/TUBES: ETT 11/24 - 11/24 (self extubated) RUE TL PICC 11/24 >> Foley 11/24 >> PIV  ASSESSMENT / PLAN:  PULMONARY A: Acute Hypercarbic & Hypoxemic Respiratory Failure Possible HCAP Pulmonary Edema ILD - Never had biopsy or formal workup.  P:   Weaning FiO2 for Sat >90% Holding on diuresis given worsening renal function Xopenex nebs prn See ID  CARDIOVASCULAR A:  Persistent Atrial Fibrillation with RVR Acute on Chronic Diastolic Congestive Heart Failure - Worsened by tachycardia.  Elevated Troponin I - Likely demand with RVR. Resolving.  H/O HTN  P:  Continuous telemetry monitoring  Vitals per unit protocol Cardiology Following & appreciate assistance Holding on diuresis given worsening renal function Amiodarone gtt @ 30mg /hr Heparin gtt per pharmacy protocol Holding home Toprol XL, Lisinopril, Cardizem & Digoxin  RENAL A:   Acute on Chronic Renal Failure Stage 3 - Likely due to poor perfusion in setting of hypotension & Lasix. Hyponatremia - Mild. Improving. AGMA - Mild. Resolved. Lactic Acidosis - Mild. Likely poor clearance & sepsis.  P:   Trending UOP with foley Monitoring renal function & electrolytes daily Replacing electrolytes as  indicated Repeat Lactic Acid now  GASTROINTESTINAL A:   No acute issues.  P:   Swallow Eval before starting diet D/C Pepcid  HEMATOLOGIC A:   Coagulopathy - Secondary to Xarelto. Leukocytosis - Likely due to sepsis. Improving.  P:  Trending cell counts daily w/ CBC Trending INR daily Holding Xarelto Heparin gtt per pharmacy protocol  INFECTIOUS A:   Sepsis H influenzae Bacteremia Probable H influenzae Pneumonia  P:   Continue Rocephin Day #2 Trending Procalcitonin per algorithm Awaiting finalization of cultures  ENDOCRINE A:   Hyperglycemia - No h/o DM.  P:   Checking Hgb A1c Accu-Checks q4hr while NPO SSI per sensitive algorithm   NEUROLOGIC A:   Acute Encephalopathy - Improving. Likely multifactorial from hypercarbia, hypoxia, & toxic metabolic. Pain - Secondary to T12 Vetebral fracture. Present prior to hospitalization & fell at home.  P:   D/C Fentanyl gtt Holding home Ultram & Oxycodone Avoiding sedating medications Not taking Zoloft at home - hadn't started  FAMILY  - Updates: Family updated at bedside 11/24. No family at bedside 11/25. Patient updated at bedside 11/25 by Dr. Ashok Cordia.  - Inter-disciplinary family meet or Palliative Care meeting due by:  11/30  TODAY'S SUMMARY:  82 y.o. female admitted w/ acute respiratory failure and atrial fibrillation with rapid ventricular response. Patient self-extubated 11/24. Family has went back to her original DNR status. Tachycardia remains a problem and patient is continuing on an Amiodarone infusion with cardiology assisting. Continuing to hold Xarelto given her coagulopathy. Plan to assess swallowing with speech eval. Her oxygen requirement remains significant but may improve with treatment of her bacteremia & probable H influenzae pneumonia. Patient's prognosis remains guarded. Patient herself confirms the desire for a DNR/DNI code status today. Leaving in ICU until cardiology rounds on patient in case they  wish to initiate a drip that requires titration.   TRH to assume care & PCCM off as of 11/26.   Sonia Baller Ashok Cordia, M.D. [facility] Pager:  [phone] After 3pm or if no response, call [phone] 04/22/2016, 7:18 AM

## 2016-04-22 NOTE — Progress Notes (Signed)
Patient Name: Allison Sellers      SUBJECTIVE:*   82 yo admitted 11/24 with acute resp failure attributed to combination of A/C HFpEF,. HAP, and intersitial lung disease. Rx with ABx  She was intubated despite an ACTIVE DNR per family request.  She was found to be in rapid AF HR 180-90s  Subsequnetly extubated  Amiodarone started.     [place] 5/17  EF 45-50%   She remains in modest respiratory distress. Her heart rates are still exceedingly fast. She is dyspneic at rest    Past Medical History:  Diagnosis Date  . Chronic diastolic CHF (congestive heart failure) ([place])   . CKD (chronic kidney disease), stage III   . Diverticular disease   . Hypertension   . Interstitial lung disease ([place])    a. Noted on CT scan 02/2011 before initiation of any antiarrhythmic meds.  . Osteopenia   . Persistent atrial fibrillation ([place])   . Seborrheic keratosis 06/2014   of hand.     Scheduled Meds:  Scheduled Meds: . cefTRIAXone (ROCEPHIN)  IV  2 g Intravenous Q24H  . chlorhexidine gluconate (MEDLINE KIT)  15 mL Mouth Rinse BID  . insulin aspart  0-9 Units Subcutaneous Q4H  . mouth rinse  15 mL Mouth Rinse QID  . sodium chloride flush  10-40 mL Intracatheter Q12H  . sodium chloride flush  3 mL Intravenous Q12H   Continuous Infusions: . amiodarone 30 mg/hr (04/22/16 0500)  . heparin 800 Units/hr (04/22/16 0500)   sodium chloride, sodium chloride, levalbuterol, sodium chloride flush, sodium chloride flush    PHYSICAL EXAM Vitals:   04/22/16 0400 04/22/16 0500 04/22/16 0758 04/22/16 0811  BP: 106/82 (!) 124/95  (!) 113/97  Pulse: (!) 155 (!) 168  (!) 40  Resp: (!) 35 (!) 25  (!) 25  Temp:  98 F (36.7 C)  98.2 F (36.8 C)  TempSrc:  Oral  Oral  SpO2: 95% 93% 98% 97%  Weight:  136 lb 0.4 oz (61.7 kg)    Height:        .Well developed and nourished in mod resp distress HENT normal Neck supple with JVP  >8-10 Coarse breath sounds bilaterally Irregularly irregular rate  and rhythm with rapid ventricular response, no murmurs or gallops Abd-soft with active BS without hepatomegaly No Clubbing cyanosis edema Skin-warm and dry A & Oriented  Grossly normal sensory and motor function   TELEMETRY: Reviewed personnally pt in  Afib RVR  HR Avg 150-160 :      Intake/Output Summary (Last 24 hours) at 04/22/16 1021 Last data filed at 04/22/16 0500  Gross per 24 hour  Intake           1007.1 ml  Output              900 ml  Net            107.1 ml    LABS: Basic Metabolic Panel:  Recent Labs Lab 04/16/2016 0225 04/19/2016 0525 04/22/16 0500  NA 128* 126* 129*  K 4.8 4.8 4.4  CL 90* 94* 93*  CO2 24 20* 24  GLUCOSE 158* 231* 125*  BUN 19 20 34*  CREATININE 1.37* 1.09* 1.69*  CALCIUM 9.3 8.3* 8.8*  MG  --  1.6* 2.3  PHOS  --  4.4 4.4   Cardiac Enzymes:  Recent Labs  04/05/2016 0525 03/29/2016 1033 04/24/2016 1722  TROPONINI 0.30* 0.04* 0.05*   CBC:  Recent Labs Lab  04/17/2016 0225 04/04/2016 0525 04/22/16 0500  WBC 29.9* 34.5* 21.1*  NEUTROABS 27.8*  --   --   HGB 14.5 14.4 12.3  HCT 43.2 42.7 37.0  MCV 95.4 95.1 94.1  PLT 352 349 323   PROTIME:  Recent Labs  03/31/2016 1033 04/22/16 0800  LABPROT 28.2* 19.5*  INR 2.59 1.63   Liver Function Tests:  Recent Labs  03/31/2016 0225  AST 38  ALT 15  ALKPHOS 113  BILITOT 1.8*  PROT 7.2  ALBUMIN 3.2*   No results for input(s): LIPASE, AMYLASE in the last 72 hours. BNP: BNP (last 3 results)  Recent Labs  04/24/2016 0248  BNP 1,114.5*    ProBNP (last 3 results) No results for input(s): PROBNP in the last 8760 hours.      ASSESSMENT AND PLAN:  Active Problems:   Permanent atrial fibrillation (HCC)   HCAP (healthcare-associated pneumonia)   Atrial fibrillation with RVR (HCC)   Acute hypoxemic respiratory failure (HCC)   Acute on chronic renal failure (HCC)  Atrial Fibrillation rates are still out of control. We will increase her amiodarone. I will give her low-dose digoxin  intravenously As well as low-dose diltiazem.  Her blood pressure seems better today in the context of her pneumonia.  Currently anticoagulated with heparin  Left atrial size-severely enlarged (43/2.6/50) and this was May 17  Renal issues are concerning elevated BUN/creatinine the question is why. She has not been on diuretics. Hemoglobin stable. Measure  specific gravity. Her physical examination is difficult with her lung disease that she has clear evidence of right-sided volume overload    Signed,   MD  04/22/2016  

## 2016-04-22 NOTE — Progress Notes (Signed)
Daughter on unit and has decided that the family would like comfort measures at this time. Dr. Elsworth Soho notified and informed. He spoke with family over the phone and opted to only increase morphine iv pushes at this time

## 2016-04-23 DIAGNOSIS — N183 Chronic kidney disease, stage 3 (moderate): Secondary | ICD-10-CM

## 2016-04-23 DIAGNOSIS — N170 Acute kidney failure with tubular necrosis: Secondary | ICD-10-CM

## 2016-04-23 DIAGNOSIS — Z7189 Other specified counseling: Secondary | ICD-10-CM

## 2016-04-23 LAB — CBC WITH DIFFERENTIAL/PLATELET
Basophils Absolute: 0 10*3/uL (ref 0.0–0.1)
Basophils Relative: 0 %
EOS PCT: 0 %
Eosinophils Absolute: 0 10*3/uL (ref 0.0–0.7)
HEMATOCRIT: 36.1 % (ref 36.0–46.0)
Hemoglobin: 12 g/dL (ref 12.0–15.0)
LYMPHS PCT: 2 %
Lymphs Abs: 0.4 10*3/uL — ABNORMAL LOW (ref 0.7–4.0)
MCH: 31.4 pg (ref 26.0–34.0)
MCHC: 33.2 g/dL (ref 30.0–36.0)
MCV: 94.5 fL (ref 78.0–100.0)
MONO ABS: 1.4 10*3/uL — AB (ref 0.1–1.0)
MONOS PCT: 6 %
NEUTROS ABS: 21.8 10*3/uL — AB (ref 1.7–7.7)
Neutrophils Relative %: 92 %
PLATELETS: 301 10*3/uL (ref 150–400)
RBC: 3.82 MIL/uL — ABNORMAL LOW (ref 3.87–5.11)
RDW: 14.1 % (ref 11.5–15.5)
WBC: 23.6 10*3/uL — ABNORMAL HIGH (ref 4.0–10.5)

## 2016-04-23 LAB — RENAL FUNCTION PANEL
Albumin: 2.4 g/dL — ABNORMAL LOW (ref 3.5–5.0)
Anion gap: 15 (ref 5–15)
BUN: 45 mg/dL — AB (ref 6–20)
CHLORIDE: 91 mmol/L — AB (ref 101–111)
CO2: 23 mmol/L (ref 22–32)
CREATININE: 1.77 mg/dL — AB (ref 0.44–1.00)
Calcium: 8.8 mg/dL — ABNORMAL LOW (ref 8.9–10.3)
GFR calc Af Amer: 29 mL/min — ABNORMAL LOW (ref >60)
GFR calc non Af Amer: 25 mL/min — ABNORMAL LOW (ref >60)
GLUCOSE: 137 mg/dL — AB (ref 65–99)
POTASSIUM: 4.4 mmol/L (ref 3.5–5.1)
Phosphorus: 6.1 mg/dL — ABNORMAL HIGH (ref 2.5–4.6)
Sodium: 129 mmol/L — ABNORMAL LOW (ref 135–145)

## 2016-04-23 LAB — PROTIME-INR
INR: 1.4
Prothrombin Time: 17.3 seconds — ABNORMAL HIGH (ref 11.4–15.2)

## 2016-04-23 LAB — MAGNESIUM: MAGNESIUM: 2.4 mg/dL (ref 1.7–2.4)

## 2016-04-23 LAB — HEMOGLOBIN A1C
Hgb A1c MFr Bld: 5.3 % (ref 4.8–5.6)
Mean Plasma Glucose: 105 mg/dL

## 2016-04-23 LAB — APTT: aPTT: 42 seconds — ABNORMAL HIGH (ref 24–36)

## 2016-04-23 LAB — GLUCOSE, CAPILLARY: GLUCOSE-CAPILLARY: 150 mg/dL — AB (ref 65–99)

## 2016-04-23 MED ORDER — GLYCOPYRROLATE 0.2 MG/ML IJ SOLN: 0.2000 mg | INTRAMUSCULAR | Status: DC | PRN

## 2016-04-23 MED ORDER — SODIUM CHLORIDE 0.9 % IV SOLN
12.5000 mg | Freq: Four times a day (QID) | INTRAVENOUS | Status: DC | PRN
  Filled 2016-04-23: qty 0.5

## 2016-04-23 MED ORDER — HALOPERIDOL LACTATE 5 MG/ML IJ SOLN: 0.5000 mg | INTRAMUSCULAR | Status: DC | PRN

## 2016-04-23 MED ORDER — HALOPERIDOL 0.5 MG PO TABS
0.5000 mg | ORAL_TABLET | ORAL | Status: DC | PRN
  Filled 2016-04-23: qty 1

## 2016-04-23 MED ORDER — POLYVINYL ALCOHOL 1.4 % OP SOLN
1.0000 [drp] | Freq: Four times a day (QID) | OPHTHALMIC | Status: DC | PRN
  Filled 2016-04-23: qty 15

## 2016-04-23 MED ORDER — ORAL CARE MOUTH RINSE: 15.0000 mL | Freq: Two times a day (BID) | OROMUCOSAL | Status: DC

## 2016-04-23 MED ORDER — HALOPERIDOL LACTATE 2 MG/ML PO CONC
0.5000 mg | ORAL | Status: DC | PRN
  Filled 2016-04-23: qty 0.3

## 2016-04-23 MED ORDER — ONDANSETRON HCL 4 MG/2ML IJ SOLN: 4.0000 mg | Freq: Four times a day (QID) | INTRAMUSCULAR | Status: DC | PRN

## 2016-04-23 MED ORDER — ACETAMINOPHEN 325 MG PO TABS: 650.0000 mg | ORAL_TABLET | Freq: Four times a day (QID) | ORAL | Status: DC | PRN

## 2016-04-23 MED ORDER — ACETAMINOPHEN 650 MG RE SUPP: 650.0000 mg | Freq: Four times a day (QID) | RECTAL | Status: DC | PRN

## 2016-04-23 MED ORDER — BIOTENE DRY MOUTH MT LIQD: 15.0000 mL | OROMUCOSAL | Status: DC | PRN

## 2016-04-23 MED ORDER — DIPHENHYDRAMINE HCL 50 MG/ML IJ SOLN: 12.5000 mg | INTRAMUSCULAR | Status: DC | PRN

## 2016-04-23 MED ORDER — ONDANSETRON 4 MG PO TBDP: 4.0000 mg | ORAL_TABLET | Freq: Four times a day (QID) | ORAL | Status: DC | PRN

## 2016-04-23 MED ORDER — GLYCOPYRROLATE 1 MG PO TABS
1.0000 mg | ORAL_TABLET | ORAL | Status: DC | PRN
  Filled 2016-04-23: qty 1

## 2016-04-23 MED ORDER — SODIUM CHLORIDE 0.9 % IV SOLN
1.0000 mg/h | INTRAVENOUS | Status: DC
  Administered 2016-04-23: 5 mg/h via INTRAVENOUS
  Filled 2016-04-23: qty 10

## 2016-04-26 LAB — CULTURE, BLOOD (ROUTINE X 2): CULTURE: NO GROWTH

## 2016-04-28 ENCOUNTER — Other Ambulatory Visit: Payer: Medicare Other

## 2016-04-28 NOTE — Progress Notes (Signed)
Patient Name: Allison Sellers      SUBJECTIVE:*   82 yo admitted 11/24 with acute resp failure attributed to combination of A/C HFpEF,. HAP, and intersitial lung disease. Rx with ABx  She was intubated despite an ACTIVE DNR per family request.  She was found to be in rapid AF HR 180-90s  Subsequnetly extubated  Amiodarone started.     Echo 5/17  EF 45-50%   Tachycardia has continued to be a problem yesterday afternoon conversations were had with CCM regarding comfort care and the use of MS   They had decided to make DNR   This a.m. she is moaning and  unresponsive to voice   Past Medical History:  Diagnosis Date  . Chronic diastolic CHF (congestive heart failure) ([place])   . CKD (chronic kidney disease), stage III   . Diverticular disease   . Hypertension   . Interstitial lung disease ([place])    a. Noted on CT scan 02/2011 before initiation of any antiarrhythmic meds.  . Osteopenia   . Persistent atrial fibrillation ([place])   . Seborrheic keratosis 06/2014   of hand.     Scheduled Meds:  Scheduled Meds: . cefTRIAXone (ROCEPHIN)  IV  2 g Intravenous Q24H  . chlorhexidine gluconate (MEDLINE KIT)  15 mL Mouth Rinse BID  . [START ON 04/24/2016] digoxin  0.125 mg Intravenous Daily  . diltiazem  30 mg Oral Q8H  . insulin aspart  0-9 Units Subcutaneous Q4H  . [START ON 04/24/2016] mouth rinse  15 mL Mouth Rinse BID  . sodium chloride flush  10-40 mL Intracatheter Q12H  . sodium chloride flush  3 mL Intravenous Q12H   Continuous Infusions: . amiodarone 60 mg/hr (28-Apr-2016 0535)  . heparin 1,350 Units/hr (2016-04-28 0531)   sodium chloride, sodium chloride, levalbuterol, morphine injection, sodium chloride flush, sodium chloride flush    PHYSICAL EXAM Vitals:   04-28-16 0334 Apr 28, 2016 0400 04/28/16 0500 2016-04-28 0600  BP: 125/87 (!) 104/52 106/69 119/71  Pulse:  (!) 139  (!) 27  Resp:  (!) 21 (!) 24 (!) 24  Temp:      TempSrc:      SpO2:  (!) 71%  (!) 75%  Weight:    137 lb 12.6 oz (62.5 kg)   Height:        .Well developed and nourished in  resp distress unresponsive as noted HENT normal Neck supple   Coarse breath sounds bilaterally Irregularly irregular rate and rhythm with rapid ventricular response, no murmurs or gallops Abd-soft with active BS without hepatomegaly No Clubbing cyanosis  Skin-warm and dry     TELEMETRY: Reviewed personnally pt in  Afib RVR  HR Avg 140s:      Intake/Output Summary (Last 24 hours) at 04/28/16 0850 Last data filed at April 28, 2016 0600  Gross per 24 hour  Intake           894.63 ml  Output              600 ml  Net           294.63 ml    LABS: Basic Metabolic Panel:  Recent Labs Lab 03/31/2016 0225  04/12/2016 0525 04/22/16 0500 04/28/2016 0447  NA 128*  --  126* 129* 129*  K 4.8  --  4.8 4.4 4.4  CL 90*  --  94* 93* 91*  CO2 24  --  20* 24 23  GLUCOSE 158*  --  231* 125* 137*  BUN 19  --  20 34* 45*  CREATININE 1.37*  --  1.09* 1.69* 1.77*  CALCIUM 9.3  --  8.3* 8.8* 8.8*  MG  --   < > 1.6* 2.3 2.4  PHOS  --   < > 4.4 4.4 6.1*  < > = values in this interval not displayed. Cardiac Enzymes:  Recent Labs  04/18/2016 0525 04/20/2016 1033 04/06/2016 1722  TROPONINI 0.30* 0.04* 0.05*   CBC:  Recent Labs Lab 04/04/2016 0225 04/09/2016 0525 04/22/16 0500 May 05, 2016 0447  WBC 29.9* 34.5* 21.1* 23.6*  NEUTROABS 27.8*  --   --  21.8*  HGB 14.5 14.4 12.3 12.0  HCT 43.2 42.7 37.0 36.1  MCV 95.4 95.1 94.1 94.5  PLT 352 349 323 301   PROTIME:  Recent Labs  03/31/2016 1033 04/22/16 0800 05/05/2016 0447  LABPROT 28.2* 19.5* 17.3*  INR 2.59 1.63 1.40   Liver Function Tests:  Recent Labs  04/25/2016 0225 May 05, 2016 0447  AST 38  --   ALT 15  --   ALKPHOS 113  --   BILITOT 1.8*  --   PROT 7.2  --   ALBUMIN 3.2* 2.4*   No results for input(s): LIPASE, AMYLASE in the last 72 hours. BNP: BNP (last 3 results)  Recent Labs  04/16/2016 0248  BNP 1,114.5*    ProBNP (last 3 results) No results for  input(s): PROBNP in the last 8760 hours.      ASSESSMENT AND PLAN:  Active Problems:   Permanent atrial fibrillation (HCC)   HCAP (healthcare-associated pneumonia)   Atrial fibrillation with RVR (HCC)   Acute hypoxemic respiratory failure (HCC)   Acute on chronic renal failure (HCC)  Atrial Fibrillation rates are still out of control.   DO NOT RESUSCITATE discussions were noted from yesterday.  I have discussed with the daughters the oldest of whom it is a Immunologist  they have decided for supportive care. She is not to be intubated.  We discussed that her lifespan will be measured in hours, that her  groaning was something we can address by the use of morphine   Signed, Virl Axe MD  05-05-2016

## 2016-04-28 NOTE — Progress Notes (Addendum)
200cc of morphine wasted in sink with Worthy Keeler, RN.  Worthy Keeler RN

## 2016-04-28 NOTE — Progress Notes (Signed)
Chaplain responded to page for failing DNR patient with grieving family in rm. Offered spiritual/emotional support and prayer. Chaplain available for follow-up.    04/28/16 1100  Clinical Encounter Type  Visited With Patient and family together  Visit Type Initial;Psychological support;Spiritual support;Social support;Critical Care  Referral From Nurse  Spiritual Encounters  Spiritual Needs Prayer;Emotional;Grief support  Stress Factors  Patient Stress Factors Health changes;Loss of control  Family Stress Factors Family relationships;Health changes;Loss of control   Gerrit Heck, Chaplain

## 2016-04-28 NOTE — Progress Notes (Signed)
[place] for Heparin Indication: atrial fibrillation  Allergies  Allergen Reactions  . Aspirin Other (See Comments)    Cannot have due to Xarelto and previous bleed  . Ciprofloxacin Other (See Comments) and Rash    Cant be given with her multaq    Patient Measurements: Height: 5' (152.4 cm) Weight: 136 lb 0.4 oz (61.7 kg) IBW/kg (Calculated) : 45.5  Vital Signs: Temp: 97.2 F (36.2 C) (11/25 2347) Temp Source: Axillary (11/25 2347) BP: 125/87 (11/26 0334) Pulse Rate: 39 (11/26 0308)  Labs:  Recent Labs  04/24/2016 0525 04/10/2016 1033 04/18/2016 1722  04/22/16 0500 04/22/16 0800 04/22/16 1859 2016-04-29 0447  HGB 14.4  --   --   --  12.3  --   --  12.0  HCT 42.7  --   --   --  37.0  --   --  36.1  PLT 349  --   --   --  323  --   --  301  APTT  --  42*  --   < >  --  47* 44* 42*  LABPROT  --  28.2*  --   --   --  19.5*  --  17.3*  INR  --  2.59  --   --   --  1.63  --  1.40  HEPARINUNFRC  --  >2.20*  --   --   --  0.83* 0.37  --   CREATININE 1.09*  --   --   --  1.69*  --   --  1.77*  TROPONINI 0.30* 0.04* 0.05*  --   --   --   --   --   < > = values in this interval not displayed.  Estimated Creatinine Clearance: 18.4 mL/min (by C-G formula based on SCr of 1.77 mg/dL (H)).   Assessment: 82 y.o. female with h/o Afib, Xarelto on hold, for heparin  Goal of Therapy:  Heparin level 0.3-0.7 units/ml aPTT 66-102 seconds Monitor platelets by anticoagulation protocol: Yes   Plan:  Increase Heparin  1350 units/hr Check heparin level in 8 hours.   Phillis Knack, PharmD, BCPS  2016/04/29, 5:28 AM

## 2016-04-28 NOTE — Progress Notes (Signed)
Patient is getting confused. Oriented to self and time but disoriented to place and situation. Patient continuously removes non-rebreather mask. When non-rebreather is on O2 sat is 85-91%. Unable to redirect patient to leave mask on, educated patient on the reason/importance of non-repbreather mask. Transferred the patient back to high flow [place] at 15L. Patient tolerates the [place] on face. O2 sat 75% now. Will continue to monitor.

## 2016-04-28 NOTE — Progress Notes (Signed)
[facility] PROGRESS NOTE  Allison Sellers OEU:[id number] DOB: 1927/04/12 DOA: 04/10/2016   PCP: Ann Held, DO     Assessment/Plan: Active Problems:   Permanent atrial fibrillation ([place])   HCAP (healthcare-associated pneumonia)   Atrial fibrillation with RVR ([place])   Acute hypoxemic respiratory failure (H[facility])   Acute on chronic renal failure ([place])   82 yo admitted 11/24 with acute resp failure attributed to combination of A/C HFpEF,. HAP, and intersitial lung disease. Rx with ABx  She was intubated despite an ACTIVE DNR per family request.  She was found to be in rapid AF HR 180-90s  Subsequnetly extubated  Amiodarone started.  Echo 5/17  EF 45-50% .Patient self-extubated  11/24. Family decided not to re-intubate if she declines last evening TRH  assumed care & PCCM off as of 11/26.   Assessment and plan Acute hypoxic hypercarbic respiratory failure secondary to healthcare associated pneumonia/interstitial lung disease Persistent atrial fibrillation with rapid ventricular response/acute on chronic diastolic heart failure/followed by Dr. Caryl Comes Acute and chronic kidney disease stage III/lactic acidosis Coagulopathy secondary to Xarelto Sepsis/H. influenzae bacteremia/H. influenzae pneumonia Hyperglycemia Acute encephalopathy Recent T12 vertebral fracture    Plan 82 y.o. female admitted w/ acute respiratory failure and atrial fibrillation with rapid ventricular response. Patient self-extubated 11/24. Family has went back to her original DNR status. Tachycardia remains a problem and patient is continuing on an Amiodarone infusion with cardiology assisting. Continuing to hold Xarelto given her coagulopathy Patient has 3 daughters who are requesting DNR/DNI/comfort care measures/will discontinue antibiotics/LAT/Accu-Chek/drips     DVT prophylaxsis heparin   Code Status:  DO NOT RESUSCITATE    Family Communication: Discussed in detail with the patient, all  imaging results, lab results explained to the patient   Disposition Plan:  End-of-life care       Consultants:  Cardiology  Critical care    Procedures:  None  Antibiotics: Anti-infectives    Start     Dose/Rate Route Frequency Ordered Stop   04/10/2016 2200  ceFEPIme (MAXIPIME) 1 g in dextrose 5 % 50 mL IVPB  Status:  Discontinued     1 g 100 mL/hr over 30 Minutes Intravenous Every 24 hours 04/24/2016 0436 04/25/2016 2138   04/08/2016 2200  vancomycin (VANCOCIN) IVPB 750 mg/150 ml premix  Status:  Discontinued     750 mg 150 mL/hr over 60 Minutes Intravenous Every 24 hours 04/25/2016 0436 04/26/2016 2138   04/24/2016 2200  cefTRIAXone (ROCEPHIN) 2 g in dextrose 5 % 50 mL IVPB     2 g 100 mL/hr over 30 Minutes Intravenous Every 24 hours 04/27/2016 2139     04/12/2016 0245  vancomycin (VANCOCIN) IVPB 1000 mg/200 mL premix     1,000 mg 200 mL/hr over 60 Minutes Intravenous  Once 04/10/2016 0233 04/16/2016 0422   04/10/2016 0245  ceFEPIme (MAXIPIME) 2 g in dextrose 5 % 50 mL IVPB     2 g 100 mL/hr over 30 Minutes Intravenous  Once 04/02/2016 0233 04/01/2016 0313         HPI/Subjective: nonverbal  Objective: Vitals:   2016-05-18 0334 05-18-16 0400 05-18-2016 0500 2016/05/18 0600  BP: 125/87 (!) 104/52 106/69 119/71  Pulse:  (!) 139  (!) 27  Resp:  (!) 21 (!) 24 (!) 24  Temp:      TempSrc:      SpO2:  (!) 71%  (!) 75%  Weight:   62.5 kg (137 lb 12.6 oz)   Height:  Intake/Output Summary (Last 24 hours) at 05/09/16 0912 Last data filed at May 09, 2016 0600  Gross per 24 hour  Intake           894.63 ml  Output              600 ml  Net           294.63 ml    Exam:  Examination:  General exam: resp distress unresponsive as noted Respiratory system: Clear to auscultation. Respiratory effort normal. Cardiovascular system: S1 & S2 heard, RRR. No JVD, murmurs, rubs, gallops or clicks. No pedal edema. Gastrointestinal system: Abdomen is nondistended, soft and nontender. No organomegaly or  masses felt. Normal bowel sounds heard.      Data Reviewed: I have personally reviewed following labs and imaging studies  Micro Results Recent Results (from the past 240 hour(s))  Blood Culture (routine x 2)     Status: Abnormal (Preliminary result)   Collection Time: 04/01/2016  2:26 AM  Result Value Ref Range Status   Specimen Description BLOOD LEFT ANTECUBITAL  Final   Special Requests BOTTLES DRAWN AEROBIC AND ANAEROBIC 5CC  Final   Culture  Setup Time   Final    GRAM NEGATIVE RODS IN BOTH AEROBIC AND ANAEROBIC BOTTLES CRITICAL RESULT CALLED TO, READ BACK BY AND VERIFIED WITHMardella Layman PHARMD 2122 04/13/2016 A BROWNING    Culture (A)  Final    HAEMOPHILUS INFLUENZAE BETA LACTAMASE NEGATIVE HEALTH DEPARTMENT NOTIFIED Referred to [facility] in [place], [place] for Serotyping.    Report Status PENDING  Incomplete  Blood Culture ID Panel (Reflexed)     Status: Abnormal   Collection Time: 04/01/2016  2:26 AM  Result Value Ref Range Status   Enterococcus species NOT DETECTED NOT DETECTED Final   Listeria monocytogenes NOT DETECTED NOT DETECTED Final   Staphylococcus species NOT DETECTED NOT DETECTED Final   Staphylococcus aureus NOT DETECTED NOT DETECTED Final   Streptococcus species NOT DETECTED NOT DETECTED Final   Streptococcus agalactiae NOT DETECTED NOT DETECTED Final   Streptococcus pneumoniae NOT DETECTED NOT DETECTED Final   Streptococcus pyogenes NOT DETECTED NOT DETECTED Final   Acinetobacter baumannii NOT DETECTED NOT DETECTED Final   Enterobacteriaceae species NOT DETECTED NOT DETECTED Final   Enterobacter cloacae complex NOT DETECTED NOT DETECTED Final   Escherichia coli NOT DETECTED NOT DETECTED Final   Klebsiella oxytoca NOT DETECTED NOT DETECTED Final   Klebsiella pneumoniae NOT DETECTED NOT DETECTED Final   Proteus species NOT DETECTED NOT DETECTED Final   Serratia marcescens NOT DETECTED NOT DETECTED Final   Haemophilus influenzae  DETECTED (A) NOT DETECTED Final    Comment: CRITICAL RESULT CALLED TO, READ BACK BY AND VERIFIED WITH: Mardella Layman PHARMD 2122 04/16/2016 A BROWNING    Neisseria meningitidis NOT DETECTED NOT DETECTED Final   Pseudomonas aeruginosa NOT DETECTED NOT DETECTED Final   Candida albicans NOT DETECTED NOT DETECTED Final   Candida glabrata NOT DETECTED NOT DETECTED Final   Candida krusei NOT DETECTED NOT DETECTED Final   Candida parapsilosis NOT DETECTED NOT DETECTED Final   Candida tropicalis NOT DETECTED NOT DETECTED Final  Blood Culture (routine x 2)     Status: None (Preliminary result)   Collection Time: 04/22/2016  4:55 AM  Result Value Ref Range Status   Specimen Description BLOOD RIGHT HAND  Final   Special Requests BOTTLES DRAWN AEROBIC AND ANAEROBIC 5CC  Final   Culture NO GROWTH 1 DAY  Final   Report Status  PENDING  Incomplete  MRSA PCR Screening     Status: None   Collection Time: 04/15/2016  6:08 AM  Result Value Ref Range Status   MRSA by PCR NEGATIVE NEGATIVE Final    Comment:        The GeneXpert MRSA Assay (FDA approved for NASAL specimens only), is one component of a comprehensive MRSA colonization surveillance program. It is not intended to diagnose MRSA infection nor to guide or monitor treatment for MRSA infections.   Urine culture     Status: None   Collection Time: 04/13/2016  5:35 PM  Result Value Ref Range Status   Specimen Description URINE, RANDOM  Final   Special Requests NONE  Final   Culture NO GROWTH  Final   Report Status 04/22/2016 FINAL  Final    Radiology Reports Dg Chest 2 View  Result Date: 04/05/2016 CLINICAL DATA:  Fall this morning, cough, shortness of Breath EXAM: CHEST  2 VIEW COMPARISON:  11/20/2014 FINDINGS: Cardiomediastinal silhouette is stable. Again noted chronic interstitial prominence and peripheral fibrotic changes. There is patchy atelectasis or infiltrate in left lower lobe. Osteopenia and degenerative changes thoracic spine. IMPRESSION:  Again noted chronic interstitial prominence and peripheral fibrotic changes. There is patchy atelectasis or infiltrate in left lower lobe. Electronically Signed   By: Lahoma Crocker M.D.   On: 04/05/2016 12:00   Dg Lumbar Spine Complete  Result Date: 04/05/2016 CLINICAL DATA:  [place] at home this morning. Moderate to severe mid low back pain worse with movement. EXAM: LUMBAR SPINE - COMPLETE 4+ VIEW COMPARISON:  06/16/2004 FINDINGS: Mild lumbar scoliosis convex towards the right. Diffuse degenerative change throughout the lumbar spine with narrowed interspaces and endplate hypertrophic changes. No anterior subluxation of the vertebrae. Degenerative changes in the facet joints. There is mild compression of the superior endplates of Z56 and L87. This is occurred in the interval since the prior study and acute compression is not excluded. Changes likely to rest present osteoporosis. No retropulsion of fracture fragments. The bone cortex appears intact. No destructive bone lesions. Visualize sacrum appears intact. Aortic atherosclerosis. Pelvic calcifications consistent with uterine fibroids. IMPRESSION: Degenerative changes and scoliosis of the lumbar spine similar to previous study. Endplate compression deformities at T11 and T12 liver occurred since previous study and could be acute or chronic. Changes likely due to osteoporosis. Electronically Signed   By: Lucienne Capers M.D.   On: 04/05/2016 06:03   Ct Thoracic Spine Wo Contrast  Result Date: 04/05/2016 CLINICAL DATA:  Status post fall at 2 a.m. this morning with onset of back pain. EXAM: CT THORACIC AND LUMBAR SPINE WITHOUT CONTRAST TECHNIQUE: Multidetector CT imaging of the thoracic and lumbar spine was performed without contrast. Multiplanar CT image reconstructions were also generated. COMPARISON:  Plain films lumbar spine earlier today. PA and lateral chest 01/20/2015. CT chest 03/28/2011. FINDINGS: CT THORACIC SPINE FINDINGS Alignment: Maintained.  Vertebrae: Bones appear osteopenic. There is a mild compression fracture deformity of T12. Vertebral body height loss is estimated at up to 15% eccentric to the left. The fracture appears acute with sharp fracture margins identified. No involvement of the posterior elements is seen. No other fracture is identified. Paraspinal and other soft tissues: There is cardiomegaly. Calcific aortic and coronary atherosclerosis is identified. Small bilateral pleural effusions are seen. There is coarsening of the pulmonary interstitium bilaterally and extensive ground-glass attenuation. Disc levels: Unremarkable. CT LUMBAR SPINE FINDINGS Segmentation: Unremarkable. Alignment: Convex right scoliosis with the apex at L2-3 is noted. Vertebrae: Bones appear  osteopenic.  No fracture is identified. Paraspinal and other soft tissues: Aortoiliac atherosclerosis without aneurysm is noted. There is partial visualization of a right renal cyst. Disc levels: Scattered mild disc bulging is most notable at L2-3 and L3-4. The central canal and foramina appear open. IMPRESSION: CT THORACIC SPINE IMPRESSION T12 compression fracture appears acute. Vertebral body height loss is mild at up to 15%. No extension into the posterior elements or associated central canal compromise is identified. Possible T11 fracture described on report of plain films earlier today is not appreciated on this study. Osteopenia. Cardiomegaly. Calcific aortic and coronary atherosclerosis. Progressive pulmonary fibrosis since the patient's comparison chest CT. Ground-glass attenuation in the lungs could be due to active inflammation, atelectasis or possibly edema with small bilateral pleural effusions noted. CT LUMBAR SPINE IMPRESSION No acute abnormality in the lumbar spine. Convex right scoliosis. Degenerative disease without central canal stenosis. Electronically Signed   By: Inge Rise M.D.   On: 04/05/2016 08:03   Ct Lumbar Spine Wo Contrast  Result Date:  04/05/2016 CLINICAL DATA:  Status post fall at 2 a.m. this morning with onset of back pain. EXAM: CT THORACIC AND LUMBAR SPINE WITHOUT CONTRAST TECHNIQUE: Multidetector CT imaging of the thoracic and lumbar spine was performed without contrast. Multiplanar CT image reconstructions were also generated. COMPARISON:  Plain films lumbar spine earlier today. PA and lateral chest 01/20/2015. CT chest 03/28/2011. FINDINGS: CT THORACIC SPINE FINDINGS Alignment: Maintained. Vertebrae: Bones appear osteopenic. There is a mild compression fracture deformity of T12. Vertebral body height loss is estimated at up to 15% eccentric to the left. The fracture appears acute with sharp fracture margins identified. No involvement of the posterior elements is seen. No other fracture is identified. Paraspinal and other soft tissues: There is cardiomegaly. Calcific aortic and coronary atherosclerosis is identified. Small bilateral pleural effusions are seen. There is coarsening of the pulmonary interstitium bilaterally and extensive ground-glass attenuation. Disc levels: Unremarkable. CT LUMBAR SPINE FINDINGS Segmentation: Unremarkable. Alignment: Convex right scoliosis with the apex at L2-3 is noted. Vertebrae: Bones appear osteopenic.  No fracture is identified. Paraspinal and other soft tissues: Aortoiliac atherosclerosis without aneurysm is noted. There is partial visualization of a right renal cyst. Disc levels: Scattered mild disc bulging is most notable at L2-3 and L3-4. The central canal and foramina appear open. IMPRESSION: CT THORACIC SPINE IMPRESSION T12 compression fracture appears acute. Vertebral body height loss is mild at up to 15%. No extension into the posterior elements or associated central canal compromise is identified. Possible T11 fracture described on report of plain films earlier today is not appreciated on this study. Osteopenia. Cardiomegaly. Calcific aortic and coronary atherosclerosis. Progressive pulmonary  fibrosis since the patient's comparison chest CT. Ground-glass attenuation in the lungs could be due to active inflammation, atelectasis or possibly edema with small bilateral pleural effusions noted. CT LUMBAR SPINE IMPRESSION No acute abnormality in the lumbar spine. Convex right scoliosis. Degenerative disease without central canal stenosis. Electronically Signed   By: Inge Rise M.D.   On: 04/05/2016 08:03   Dg Chest Port 1 View  Result Date: 04/22/2016 CLINICAL DATA:  Respiratory failure EXAM: PORTABLE CHEST 1 VIEW COMPARISON:  04/04/2016 FINDINGS: Cardiac shadow is stable. Right-sided PICC line is again seen and stable. The endotracheal tube and nasogastric catheter have been removed in the interval. Persistent left basilar infiltrate is noted. New patchy infiltrates are seen in the upper lobes bilaterally. No bony abnormality is noted. IMPRESSION: Increasing bilateral infiltrates. Electronically Signed   By: Elta Guadeloupe  Lukens M.D.   On: 04/22/2016 07:06   Dg Chest Port 1 View  Result Date: 04/06/2016 CLINICAL DATA:  Central catheter placement EXAM: PORTABLE CHEST 1 VIEW COMPARISON:  Study obtained earlier in the day FINDINGS: Central catheter tip is in the right atrium, approximately 3.5 cm beyond the cavoatrial junction. Endotracheal tube tip is 1.2 cm above the carina. Nasogastric tube tip and side port are below the diaphragm. No pneumothorax. There is airspace consolidation in the left mid lower lung zones. Lungs elsewhere clear. Heart is mildly enlarged with pulmonary vascularity within normal limits. There is atherosclerotic calcification in the aorta. No adenopathy. No bone lesions. IMPRESSION: Tube and catheter positions as described without pneumothorax. Left mid and lower lung zone airspace consolidation, likely pneumonia. Lungs elsewhere clear. There has been clearing of interstitial edema since study obtained earlier in the day. There is aortic atherosclerosis. Electronically Signed    By: Lowella Grip III M.D.   On: 04/17/2016 14:57   Dg Chest Portable 1 View  Result Date: 04/10/2016 CLINICAL DATA:  Endotracheal tube placement.  Initial encounter. EXAM: PORTABLE CHEST 1 VIEW COMPARISON:  Chest radiograph performed earlier today at 2:21 a.m. FINDINGS: The patient's endotracheal tube is seen ending 1-2 cm above the carina. This could be retracted 1-2 cm. Bilateral airspace opacification raises concern for persistent pneumonia, perhaps slightly improved from the prior study. Underlying interstitial edema cannot be excluded. No pleural effusion or pneumothorax is seen. The cardiomediastinal silhouette is mildly enlarged. No acute osseous abnormalities are seen. External pacing pads are noted. IMPRESSION: 1. Endotracheal tube seen ending 1-2 cm above the carina. This could be retracted 1-2 cm. 2. Bilateral airspace opacification raises concern for persistent pneumonia, perhaps slightly improved from the prior study. Underlying interstitial edema cannot be excluded. 3. Mild cardiomegaly. Electronically Signed   By: Garald Balding M.D.   On: 04/13/2016 03:22   Dg Chest Port 1 View  Result Date: 04/07/2016 CLINICAL DATA:  Acute onset of generalized chest pain and shortness of breath. Hemoptysis. Atrial fibrillation. Initial encounter. EXAM: PORTABLE CHEST 1 VIEW COMPARISON:  Chest radiograph performed 04/05/2016 FINDINGS: Diffuse bilateral airspace opacification is noted, worse on the left, concerning for multifocal pneumonia. No pleural effusion or pneumothorax is seen. The cardiomediastinal silhouette is mildly enlarged. No acute osseous abnormalities are seen. IMPRESSION: Diffuse bilateral airspace opacification, worse on the left, concerning for multifocal pneumonia. Mild cardiomegaly. Electronically Signed   By: Garald Balding M.D.   On: 04/13/2016 02:33     CBC  Recent Labs Lab 04/01/2016 0225 04/06/2016 0525 04/22/16 0500 05/10/2016 0447  WBC 29.9* 34.5* 21.1* 23.6*  HGB 14.5  14.4 12.3 12.0  HCT 43.2 42.7 37.0 36.1  PLT 352 349 323 301  MCV 95.4 95.1 94.1 94.5  MCH 32.0 32.1 31.3 31.4  MCHC 33.6 33.7 33.2 33.2  RDW 14.2 14.3 14.1 14.1  LYMPHSABS 0.9  --   --  0.4*  MONOABS 1.2*  --   --  1.4*  EOSABS 0.0  --   --  0.0  BASOSABS 0.0  --   --  0.0    Chemistries   Recent Labs Lab 04/14/2016 0225 04/08/2016 0525 04/22/16 0500 2016/05/10 0447  NA 128* 126* 129* 129*  K 4.8 4.8 4.4 4.4  CL 90* 94* 93* 91*  CO2 24 20* 24 23  GLUCOSE 158* 231* 125* 137*  BUN 19 20 34* 45*  CREATININE 1.37* 1.09* 1.69* 1.77*  CALCIUM 9.3 8.3* 8.8* 8.8*  MG  --  1.6* 2.3 2.4  AST 38  --   --   --   ALT 15  --   --   --   ALKPHOS 113  --   --   --   BILITOT 1.8*  --   --   --    ------------------------------------------------------------------------------------------------------------------ estimated creatinine clearance is 18.5 mL/min (by C-G formula based on SCr of 1.77 mg/dL (H)). ------------------------------------------------------------------------------------------------------------------ No results for input(s): HGBA1C in the last 72 hours. ------------------------------------------------------------------------------------------------------------------ No results for input(s): CHOL, HDL, LDLCALC, TRIG, CHOLHDL, LDLDIRECT in the last 72 hours. ------------------------------------------------------------------------------------------------------------------ No results for input(s): TSH, T4TOTAL, T3FREE, THYROIDAB in the last 72 hours.  Invalid input(s): FREET3 ------------------------------------------------------------------------------------------------------------------ No results for input(s): VITAMINB12, FOLATE, FERRITIN, TIBC, IRON, RETICCTPCT in the last 72 hours.  Coagulation profile  Recent Labs Lab 04/02/2016 1033 04/22/16 0800 05-03-2016 0447  INR 2.59 1.63 1.40    No results for input(s): DDIMER in the last 72 hours.  Cardiac Enzymes  Recent  Labs Lab 04/04/2016 0525 04/17/2016 1033 04/15/2016 1722  TROPONINI 0.30* 0.04* 0.05*   ------------------------------------------------------------------------------------------------------------------ Invalid input(s): POCBNP   CBG:  Recent Labs Lab 04/22/16 0935 04/22/16 1411 04/22/16 1627 04/22/16 2353  GLUCAP 160* 152* 112* 143*       Studies: Dg Chest Port 1 View  Result Date: 04/22/2016 CLINICAL DATA:  Respiratory failure EXAM: PORTABLE CHEST 1 VIEW COMPARISON:  04/08/2016 FINDINGS: Cardiac shadow is stable. Right-sided PICC line is again seen and stable. The endotracheal tube and nasogastric catheter have been removed in the interval. Persistent left basilar infiltrate is noted. New patchy infiltrates are seen in the upper lobes bilaterally. No bony abnormality is noted. IMPRESSION: Increasing bilateral infiltrates. Electronically Signed   By: Inez Catalina M.D.   On: 04/22/2016 07:06   Dg Chest Port 1 View  Result Date: 04/08/2016 CLINICAL DATA:  Central catheter placement EXAM: PORTABLE CHEST 1 VIEW COMPARISON:  Study obtained earlier in the day FINDINGS: Central catheter tip is in the right atrium, approximately 3.5 cm beyond the cavoatrial junction. Endotracheal tube tip is 1.2 cm above the carina. Nasogastric tube tip and side port are below the diaphragm. No pneumothorax. There is airspace consolidation in the left mid lower lung zones. Lungs elsewhere clear. Heart is mildly enlarged with pulmonary vascularity within normal limits. There is atherosclerotic calcification in the aorta. No adenopathy. No bone lesions. IMPRESSION: Tube and catheter positions as described without pneumothorax. Left mid and lower lung zone airspace consolidation, likely pneumonia. Lungs elsewhere clear. There has been clearing of interstitial edema since study obtained earlier in the day. There is aortic atherosclerosis. Electronically Signed   By: Lowella Grip III M.D.   On: 04/03/2016 14:57       Lab Results  Component Value Date   HGBA1C 5.3 05/19/2014   HGBA1C 5.5 03/27/2011   Lab Results  Component Value Date   LDLCALC 52 05/11/2015   CREATININE 1.77 (H) 05-03-16       Scheduled Meds: . cefTRIAXone (ROCEPHIN)  IV  2 g Intravenous Q24H  . chlorhexidine gluconate (MEDLINE KIT)  15 mL Mouth Rinse BID  . [START ON 04/24/2016] digoxin  0.125 mg Intravenous Daily  . diltiazem  30 mg Oral Q8H  . insulin aspart  0-9 Units Subcutaneous Q4H  . [START ON 04/24/2016] mouth rinse  15 mL Mouth Rinse BID  . sodium chloride flush  10-40 mL Intracatheter Q12H  . sodium chloride flush  3 mL Intravenous Q12H   Continuous Infusions: . amiodarone 60 mg/hr (2016/05/03  1071)  . heparin 1,350 Units/hr (05/10/2016 0531)     LOS: 2 days    Time spent: >30 MINS    [facility] Pager [phone]. If 7PM-7AM, please contact night-coverage at [email or web address], password [facility] 05-10-16, 9:12 AM  LOS: 2 days

## 2016-04-28 DEATH — deceased

## 2016-05-09 LAB — CULTURE, BLOOD (ROUTINE X 2)

## 2016-05-11 ENCOUNTER — Ambulatory Visit (HOSPITAL_COMMUNITY): Payer: Medicare Other | Admitting: Nurse Practitioner

## 2016-05-16 ENCOUNTER — Encounter: Payer: Medicare Other | Admitting: Family Medicine

## 2016-05-29 NOTE — Discharge Summary (Addendum)
Death summary   83 yo admitted 11/24 with acute resp failure attributed to combination of A/C HFpEF,. HAP, and intersitial lung disease. Rx with ABx She was intubated despite an ACTIVE DNR per family request. She was found to be in rapid AF HR 180-90s Subsequnetly extubated  Amiodarone started. Echo 5/17 EF 45-50% .Patient self-extubated  11/24. Family decided not to re-intubate if she declines last evening TRH  assumed care &PCCM off as of 11/26.  Tachycardia has continued .She was found to be in rapid AF HR 180-90s , conversations were had with CCM regarding comfort care and the use of MS   They had decided to make her DNR  This MD took care of her for only a few hours  HOSPITAL COURSE  Summary of active problems  Acute hypoxic hypercarbic respiratory failure secondary to healthcare associated pneumonia/interstitial lung disease Persistent atrial fibrillation with rapid ventricular response/acute on chronic diastolic heart failure/followed by Dr. Caryl Comes Acute and chronic kidney disease stage III/lactic acidosis Coagulopathy secondary to Xarelto Sepsis/H. influenzae bacteremia/H. influenzae pneumonia Hyperglycemia Acute encephalopathy Recent T12 vertebral fracture   Final disposition Patient has 3 daughters who are requesting DNR/DNI/comfort care measures/  discontinued antibiotics/labs/Accu-Chek/drips

## 2016-06-09 ENCOUNTER — Encounter: Payer: Medicare Other | Admitting: Family Medicine
